# Patient Record
Sex: Male | Born: 1950 | Race: White | Hispanic: No | Marital: Married | Smoking: Never smoker
Health system: Southern US, Community
[De-identification: ages and names within clinical notes are randomized; demographics above are authoritative.]

## PROBLEM LIST (undated history)

## (undated) DIAGNOSIS — C801 Malignant (primary) neoplasm, unspecified: Secondary | ICD-10-CM

## (undated) DIAGNOSIS — M419 Scoliosis, unspecified: Secondary | ICD-10-CM

## (undated) DIAGNOSIS — N401 Enlarged prostate with lower urinary tract symptoms: Secondary | ICD-10-CM

## (undated) DIAGNOSIS — Z8042 Family history of malignant neoplasm of prostate: Secondary | ICD-10-CM

## (undated) DIAGNOSIS — N138 Other obstructive and reflux uropathy: Secondary | ICD-10-CM

## (undated) DIAGNOSIS — I10 Essential (primary) hypertension: Secondary | ICD-10-CM

## (undated) DIAGNOSIS — H9190 Unspecified hearing loss, unspecified ear: Secondary | ICD-10-CM

## (undated) DIAGNOSIS — Z801 Family history of malignant neoplasm of trachea, bronchus and lung: Secondary | ICD-10-CM

## (undated) DIAGNOSIS — Z803 Family history of malignant neoplasm of breast: Secondary | ICD-10-CM

## (undated) DIAGNOSIS — K219 Gastro-esophageal reflux disease without esophagitis: Secondary | ICD-10-CM

## (undated) DIAGNOSIS — M1990 Unspecified osteoarthritis, unspecified site: Secondary | ICD-10-CM

## (undated) DIAGNOSIS — M549 Dorsalgia, unspecified: Secondary | ICD-10-CM

## (undated) DIAGNOSIS — R519 Headache, unspecified: Secondary | ICD-10-CM

## (undated) DIAGNOSIS — N400 Enlarged prostate without lower urinary tract symptoms: Secondary | ICD-10-CM

## (undated) DIAGNOSIS — R972 Elevated prostate specific antigen [PSA]: Secondary | ICD-10-CM

## (undated) DIAGNOSIS — R51 Headache: Secondary | ICD-10-CM

## (undated) DIAGNOSIS — Z800 Family history of malignant neoplasm of digestive organs: Secondary | ICD-10-CM

## (undated) DIAGNOSIS — E7800 Pure hypercholesterolemia, unspecified: Secondary | ICD-10-CM

## (undated) DIAGNOSIS — E785 Hyperlipidemia, unspecified: Secondary | ICD-10-CM

## (undated) DIAGNOSIS — Z85038 Personal history of other malignant neoplasm of large intestine: Secondary | ICD-10-CM

## (undated) HISTORY — DX: Personal history of other malignant neoplasm of large intestine: Z85.038

## (undated) HISTORY — DX: Family history of malignant neoplasm of digestive organs: Z80.0

## (undated) HISTORY — DX: Family history of malignant neoplasm of breast: Z80.3

## (undated) HISTORY — PX: COLONOSCOPY: SHX174

## (undated) HISTORY — DX: Family history of malignant neoplasm of prostate: Z80.42

## (undated) HISTORY — PX: TONSILLECTOMY: SUR1361

## (undated) HISTORY — DX: Family history of malignant neoplasm of trachea, bronchus and lung: Z80.1

## (undated) SURGERY — Surgical Case
Anesthesia: *Unknown

---

## 2004-11-21 ENCOUNTER — Ambulatory Visit: Payer: Self-pay | Admitting: Internal Medicine

## 2006-08-13 DIAGNOSIS — D239 Other benign neoplasm of skin, unspecified: Secondary | ICD-10-CM

## 2006-08-13 HISTORY — DX: Other benign neoplasm of skin, unspecified: D23.9

## 2010-06-19 ENCOUNTER — Ambulatory Visit: Payer: Self-pay

## 2010-08-27 ENCOUNTER — Ambulatory Visit: Payer: Self-pay | Admitting: Gastroenterology

## 2010-08-27 HISTORY — PX: COLONOSCOPY: SHX174

## 2011-01-22 ENCOUNTER — Ambulatory Visit: Payer: Self-pay | Admitting: Internal Medicine

## 2011-02-04 ENCOUNTER — Ambulatory Visit: Payer: Self-pay | Admitting: Internal Medicine

## 2013-05-02 DIAGNOSIS — N401 Enlarged prostate with lower urinary tract symptoms: Secondary | ICD-10-CM | POA: Insufficient documentation

## 2013-05-02 DIAGNOSIS — R972 Elevated prostate specific antigen [PSA]: Secondary | ICD-10-CM | POA: Insufficient documentation

## 2014-03-20 NOTE — Progress Notes (Signed)
 [facility] UROLOGY [facility] [address] [place], [address]  Office Visit  Patient Name: Timothy Nicholson  Date of  Visit: 03/20/2014   Date of Birth: 07-12-1950  Assessment/Plan:  1.  BPH with lower urinary tract symptoms  Stable on tamsulosin.  2.  Elevated PSA  PSA repeated today and stable will move to annual follow-up.   History of Present Illness:  Chief Complaint: Prostate check  The patient is a 63 y.o. male who presents today for follow-up of an elevated PSA.  Prostate biopsy was performed March 2015 for PSA of 4.3.  Prostate volume was calculated at 71 cc.  Pathology report remarkable for BPH.  He has mild-to-moderate lower urinary tract symptoms and remains on tamsulosin.  He will note worsening voiding symptoms when he misses a dose.  He denies dysuria or gross hematuria.  He denies flank, abdominal, pelvic or testicular pain.  His last PSA June 2015 was 3.5.  Allergies Sulfa (sulfonamide antibiotics)  Medications   Outpatient Encounter Prescriptions as of 03/20/2014  Medication Sig Dispense Refill  . esomeprazole (NEXIUM) 20 MG capsule Take 20 mg by mouth every morning before breakfast.    . lisinopril -hydrochlorothiazide  (PRINZIDE ,ZESTORETIC ) 10-12.5 mg per tablet Take 1 tablet by mouth daily.    . tamsulosin (FLOMAX) 0.4 mg capsule Take 1 capsule (0.4 mg total) by mouth daily. 30 capsule 1  . [DISCONTINUED] lansoprazole (PREVACID) 30 MG capsule Take 30 mg by mouth daily.     No facility-administered encounter medications on file as of 03/20/2014.    Past Medical History Past Medical History  Diagnosis Date  . Back problem   . Hemorrhoids   . Hypertension     Past Surgical History No past surgical history on file.  Family History family history includes Cancer in his father and mother; Hypertension in his mother. There is no history of GU problems, Kidney cancer, or Prostate cancer.  Social History: Tobacco use:   reports that he has never smoked. He  has never used smokeless tobacco. Alcohol use:   reports that he drinks alcohol. Drug use:  reports that he does not use illicit drugs.  Review of Systems A 12 point review of systems was negative except for pertinent items noted in the HPI and on the patient intake form.  The form has been reviewed with the patient or caregiver.  Objective:  Vital Signs Filed Vitals:   03/20/14 0759  BP: 136/75  Pulse: 77  Resp: 16  Height: 177.8 cm (5' 10)  Weight: 81.647 kg (180 lb)    Physical Exam Mental Status- Alert. Cooperative, Consistent with stated age. Oriented 3. Well-nourished, Well developed. Gait- Normal. Hydration- Well hydrated. Voice- Normal.  Integumentary: General Characteristics:No rashes. Normal coloration of skin. Normal skin moisture.  Head and Neck: Global Assessment- atraumatic, normocephalic, atraumatic with no gross lesions.  No abnormal facial aesthetics. No abnormal movements. Eyeball- Bilateral- Normal. Sclera/Conjunctiva- Bilateral- normal. Nose - symmetric. Nares- symmetric. Lips: Normal color, moist, no gross lesions.  Chest and Lung Exam: Chest - normal excursion with symmetric chest walls, quiet, even and easy respiratory effort with no use of accessory muscles.    Rectal: External: Normal external exam. Internal: Normal internal exam. Prostate: Nontender, No nodules. Size: 50 cm. Seminal vesical: Nonpalpable.  Neurologic: Motor and sensory grossly intact. Affect- normal and appropriate. Speech- Normal. Thought content- Normal. Cognitive function- Normal. Coordination- Normal.   Musculoskeletal: Posture- Normal.  Musculoskeletal strength and tone grossly normal.   Test Results Results for orders placed or  performed in visit on 03/20/14  [place] POC URINALYSIS WITH MICROSCOPIC  Result Value Ref Range   Color, UA Yellow    Clarity, UA Clear    Glucose, UA Negative    Bilirubin, UA Negative    Ketones, POC Negative     Spec Grav, UA 1.020    Blood, UA Negative    pH, UA 5.0    Protein, UA Negative    Urobilinogen, UA Normal    Leukocytes, UA Negative    Nitrite, UA Negative    WBC UA, POC     RBC UA, POC     Bacteria UA, POC     Sediment UA, POC      Urine Microscopy: WBC's: Negative. RBC's: Negative. Bacteria: Negative. Sediment: Negative.  Imaging: None    Scott C Stoioff

## 2014-03-21 NOTE — Telephone Encounter (Signed)
-----   Message from Glendia JAYSON Barba, MD sent at 03/21/2014  8:57 AM EST ----- PSA has increased to 17.4-large jumps in PSA are usually due to inflammation.  Recommend repeating the PSA in 6 weeks.  If it is still elevated will need to discuss repeat biopsy.

## 2014-03-21 NOTE — Progress Notes (Signed)
 Chief Complaint  Patient presents with  . Flu Vaccine    pt requesting flu shot  . Headache    ongoing for years    HPI  Timothy Nicholson is a 63 y.o. here for an acute issue.  Hx of neck pain, flares at various times.  Cleans gutters.  Only a mild flare today.  Plays golf.  Otherwise doing well. Has occasional lightheadedness with standing.    ROS  Pertinent items are noted in HPI.  Outpatient Encounter Prescriptions as of 03/21/2014  Medication Sig Dispense Refill  . esomeprazole (NEXIUM) 20 MG DR capsule Take 20 mg by mouth once daily.    . lisinopril -hydrochlorothiazide  (PRINZIDE ,ZESTORETIC ) 10-12.5 mg tablet Take 1 tablet by mouth once daily.    . tamsulosin (FLOMAX) 0.4 mg capsule Take 0.4 mg by mouth once daily. Take 30 minutes after same meal each day.    . [DISCONTINUED] lansoprazole (PREVACID) 30 MG DR capsule Take 30 mg by mouth once daily.     No facility-administered encounter medications on file as of 03/21/2014.    Allergies as of 03/21/2014 [place] Anes as Reviewed 03/21/2014  Allergen Reaction Noted  . Sulfa (sulfonamide antibiotics) Unknown 06/23/2013    Past Medical History  Diagnosis Date  . GERD (gastroesophageal reflux disease)   . Hyperlipidemia   . Hypertension   . Dyslipidemia   . Osteoarthritis   . Chronic back pain   . Decreased hearing     Past Surgical History  Procedure Laterality Date  . Tonsillectomy      Filed Vitals:   03/21/14 0942  BP: 124/72  Pulse: 83  Temp: 36.9 C (98.4 F)    Physical Exam  General. Well appearing; NAD; VS reviewed     Neck. Supple. With mild paracervical tenderness. No spinal tenderness. Lungs. Respirations unlabored; clear to auscultation bilaterally. Cardiovascular. Heart regular rate and rhythm without murmurs, gallops, or rubs. Extremities:  No edema. Skin. Normal color and turgor Neurologic. Alert and oriented x3   Assessment and Plan 1. Cervicalgia Intermittent. - cyclobenzaprine (FLEXERIL) 10 MG  tablet; Take 1 tablet (10 mg total) by mouth 3 (three) times daily as needed.  Dispense: 21 tablet; Refill: 0 - naproxen (NAPROSYN) 500 MG tablet; Take 1 tablet (500 mg total) by mouth 2 (two) times daily with meals.  Dispense: 60 tablet; Refill: 0  2. Health maintenance.  Flu vaccine.   I have personally performed this service.  [address] [place], [place]

## 2014-05-17 DIAGNOSIS — E7800 Pure hypercholesterolemia, unspecified: Secondary | ICD-10-CM | POA: Insufficient documentation

## 2014-05-17 DIAGNOSIS — I129 Hypertensive chronic kidney disease with stage 1 through stage 4 chronic kidney disease, or unspecified chronic kidney disease: Secondary | ICD-10-CM | POA: Insufficient documentation

## 2014-09-24 ENCOUNTER — Encounter (HOSPITAL_BASED_OUTPATIENT_CLINIC_OR_DEPARTMENT_OTHER): Payer: Self-pay | Admitting: *Deleted

## 2014-09-24 ENCOUNTER — Emergency Department (HOSPITAL_BASED_OUTPATIENT_CLINIC_OR_DEPARTMENT_OTHER)
Admission: EM | Admit: 2014-09-24 | Discharge: 2014-09-24 | Disposition: A | Discharge status: Home / Self Care | Payer: 59 | Attending: Emergency Medicine | Admitting: Emergency Medicine

## 2014-09-24 DIAGNOSIS — N400 Enlarged prostate without lower urinary tract symptoms: Secondary | ICD-10-CM | POA: Diagnosis not present

## 2014-09-24 DIAGNOSIS — N390 Urinary tract infection, site not specified: Secondary | ICD-10-CM | POA: Insufficient documentation

## 2014-09-24 DIAGNOSIS — R339 Retention of urine, unspecified: Secondary | ICD-10-CM | POA: Diagnosis present

## 2014-09-24 HISTORY — DX: Benign prostatic hyperplasia without lower urinary tract symptoms: N40.0

## 2014-09-24 HISTORY — DX: Essential (primary) hypertension: I10

## 2014-09-24 LAB — URINALYSIS, ROUTINE W REFLEX MICROSCOPIC
Bilirubin Urine: NEGATIVE
GLUCOSE, UA: NEGATIVE mg/dL
HGB URINE DIPSTICK: NEGATIVE
Ketones, ur: NEGATIVE mg/dL
NITRITE: POSITIVE — AB
PH: 6 (ref 5.0–8.0)
Protein, ur: NEGATIVE mg/dL
SPECIFIC GRAVITY, URINE: 1.017 (ref 1.005–1.030)
Urobilinogen, UA: 0.2 mg/dL (ref 0.0–1.0)

## 2014-09-24 LAB — URINE MICROSCOPIC-ADD ON

## 2014-09-24 MED ORDER — CIPROFLOXACIN HCL 500 MG PO TABS: 500.0000 mg | ORAL_TABLET | Freq: Two times a day (BID) | ORAL | Status: DC

## 2014-09-24 MED ORDER — CIPROFLOXACIN HCL 500 MG PO TABS
500.0000 mg | ORAL_TABLET | Freq: Once | ORAL | Status: AC
  Administered 2014-09-24: 500 mg via ORAL
  Filled 2014-09-24: qty 1

## 2014-09-24 NOTE — Discharge Instructions (Signed)
Acute Urinary Retention °Acute urinary retention is the temporary inability to urinate. °This is a common problem in older men. As men age their prostates become larger and block the flow of urine from the bladder. This is usually a problem that has come on gradually.  °HOME CARE INSTRUCTIONS °If you are sent home with a Foley catheter and a drainage system, you will need to discuss the best course of action with your health care provider. While the catheter is in, maintain a good intake of fluids. Keep the drainage bag emptied and lower than your catheter. This is so that contaminated urine will not flow back into your bladder, which could lead to a urinary tract infection. °There are two main types of drainage bags. One is a large bag that usually is used at night. It has a good capacity that will allow you to sleep through the night without having to empty it. The second type is called a leg bag. It has a smaller capacity, so it needs to be emptied more frequently. However, the main advantage is that it can be attached by a leg strap and can go underneath your clothing, allowing you the freedom to move about or leave your home. °Only take over-the-counter or prescription medicines for pain, discomfort, or fever as directed by your health care provider.  °SEEK MEDICAL CARE IF: °· You develop a low-grade fever. °· You experience spasms or leakage of urine with the spasms. °SEEK IMMEDIATE MEDICAL CARE IF:  °· You develop chills or fever. °· Your catheter stops draining urine. °· Your catheter falls out. °· You start to develop increased bleeding that does not respond to rest and increased fluid intake. °MAKE SURE YOU: °· Understand these instructions. °· Will watch your condition. °· Will get help right away if you are not doing well or get worse. °Document Released: 07/07/2000 Document Revised: 04/05/2013 Document Reviewed: 09/09/2012 °ExitCare® Patient Information ©2015 [employer]. This information is not  intended to replace advice given to you by your health care provider. Make sure you discuss any questions you have with your health care provider. °Urinary Tract Infection °Urinary tract infections (UTIs) can develop anywhere along your urinary tract. Your urinary tract is your body's drainage system for removing wastes and extra water. Your urinary tract includes two kidneys, two ureters, a bladder, and a urethra. Your kidneys are a pair of bean-shaped organs. Each kidney is about the size of your fist. They are located below your ribs, one on each side of your spine. °CAUSES °Infections are caused by microbes, which are microscopic organisms, including fungi, viruses, and bacteria. These organisms are so small that they can only be seen through a microscope. Bacteria are the microbes that most commonly cause UTIs. °SYMPTOMS  °Symptoms of UTIs [place] vary by age and gender of the patient and by the location of the infection. Symptoms in young women typically include a frequent and intense urge to urinate and a painful, burning feeling in the bladder or urethra during urination. Older women and men are more likely to be tired, shaky, and weak and have muscle aches and abdominal pain. A fever [place] mean the infection is in your kidneys. Other symptoms of a kidney infection include pain in your back or sides below the ribs, nausea, and vomiting. °DIAGNOSIS °To diagnose a UTI, your caregiver will ask you about your symptoms. Your caregiver also will ask to provide a urine sample. The urine sample will be tested for bacteria and white blood   cells. White blood cells are made by your body to help fight infection. °TREATMENT  °Typically, UTIs can be treated with medication. Because most UTIs are caused by a bacterial infection, they usually can be treated with the use of antibiotics. The choice of antibiotic and length of treatment depend on your symptoms and the type of bacteria causing your infection. °HOME CARE  INSTRUCTIONS °· If you were prescribed antibiotics, take them exactly as your caregiver instructs you. Finish the medication even if you feel better after you have only taken some of the medication. °· Drink enough water and fluids to keep your urine clear or pale yellow. °· Avoid caffeine, tea, and carbonated beverages. They tend to irritate your bladder. °· Empty your bladder often. Avoid holding urine for long periods of time. °· Empty your bladder before and after sexual intercourse. °· After a bowel movement, women should cleanse from front to back. Use each tissue only once. °SEEK MEDICAL CARE IF:  °· You have back pain. °· You develop a fever. °· Your symptoms do not begin to resolve within 3 days. °SEEK IMMEDIATE MEDICAL CARE IF:  °· You have severe back pain or lower abdominal pain. °· You develop chills. °· You have nausea or vomiting. °· You have continued burning or discomfort with urination. °MAKE SURE YOU:  °· Understand these instructions. °· Will watch your condition. °· Will get help right away if you are not doing well or get worse. °Document Released: 01/08/2005 Document Revised: 09/30/2011 Document Reviewed: 05/09/2011 °ExitCare® Patient Information ©2015 [employer]. This information is not intended to replace advice given to you by your health care provider. Make sure you discuss any questions you have with your health care provider. ° °

## 2014-09-24 NOTE — ED Provider Notes (Signed)
TIME SEEN: 9:50 AM  CHIEF COMPLAINT: Urinary retention, dysuria  HPI: Pt is a 64 y.o. male with history of BPH who is on Flomax who presents to the emergency department with urinary retention for the past 2 days and dysuria when he is able to urinate. No back pain, numbness, tingly or focal weakness. No bowel or bladder incontinence. Did have a fever of 101.5 last night. Denies nausea, vomiting or diarrhea. No new medications. Had urinary retention once before because of his BPH.  ROS: See HPI Constitutional: no fever  Eyes: no drainage  ENT: no runny nose   Cardiovascular:  no chest pain  Resp: no SOB  GI: no vomiting GU: no dysuria Integumentary: no rash  Allergy: no hives  Musculoskeletal: no leg swelling  Neurological: no slurred speech ROS otherwise negative  PAST MEDICAL HISTORY/PAST SURGICAL HISTORY:  No past medical history on file.  MEDICATIONS:  Prior to Admission medications   Not on File    ALLERGIES:  Allergies not on file  SOCIAL HISTORY:  History  Substance Use Topics  . Smoking status: Not on file  . Smokeless tobacco: Not on file  . Alcohol Use: Not on file    FAMILY HISTORY: No family history on file.  EXAM: BP 135/72 mmHg  Pulse 93  Temp(Src) 98.1 F (36.7 C) (Oral)  Resp 18  Ht 5\' 10"  (1.778 m)  Wt 185 lb (83.915 kg)  BMI 26.54 kg/m2  SpO2 95% CONSTITUTIONAL: Alert and oriented and responds appropriately to questions. Well-appearing; well-nourished HEAD: Normocephalic EYES: Conjunctivae clear, PERRL ENT: normal nose; no rhinorrhea; moist mucous membranes; pharynx without lesions noted NECK: Supple, no meningismus, no LAD  CARD: RRR; S1 and S2 appreciated; no murmurs, no clicks, no rubs, no gallops RESP: Normal chest excursion without splinting or tachypnea; breath sounds clear and equal bilaterally; no wheezes, no rhonchi, no rales, no hypoxia or respiratory distress, speaking full sentences ABD/GI: Normal bowel sounds; soft, mildly  tender over the lower abdomen with mild distention from air retention, no rebound, no guarding, no peritoneal signs BACK:  The back appears normal and is non-tender to palpation, there is no CVA tenderness EXT: Normal ROM in all joints; non-tender to palpation; no edema; normal capillary refill; no cyanosis, no calf tenderness or swelling    SKIN: Normal color for age and race; warm NEURO: Moves all extremities equally, sensation to light touch intact diffusely, cranial nerves II through XII intact PSYCH: The patient's mood and manner are appropriate. Grooming and personal hygiene are appropriate.  MEDICAL DECISION MAKING: Patient here with urinary retention likely from BPH. Foley catheter placed and over 400 mL of urine drained. Reports feeling much better. He does have a nitrite-positive UTI which [place]er also be contributing to his urinary retention. Culture pending. Will discharge on Cipro. He has an appointment with his urologist Dr. Alinda Money in 3 days. Discussed return precautions and supportive care instructions. He verbalizes understanding and is comfortable with plan.       [place], DO 09/24/14 1644

## 2014-09-24 NOTE — ED Notes (Signed)
Bladder scan 435cc urine in blader

## 2014-09-24 NOTE — ED Notes (Signed)
Patient c/o urinary retention for the past two days, takes flomax, feels urge to urinate, but only dribbles. Fever 101.5 last night

## 2014-09-27 LAB — URINE CULTURE

## 2014-09-28 ENCOUNTER — Telehealth (HOSPITAL_BASED_OUTPATIENT_CLINIC_OR_DEPARTMENT_OTHER): Payer: Self-pay | Admitting: Emergency Medicine

## 2014-09-28 NOTE — Telephone Encounter (Signed)
Post ED Visit - Positive Culture Follow-up  Culture report reviewed by antimicrobial stewardship pharmacist: []  Wes Dulaney, Pharm.D., BCPS [x]  Heide Guile, Pharm.D., BCPS []  Alycia Rossetti, Pharm.D., BCPS []  [place]ridge, Pharm.D., BCPS, AAHIVP []  Legrand Como, Pharm.D., BCPS, AAHIVP []  Isac Sarna, Pharm.D., BCPS  Positive urine culture Staph Treated with ciprofloxacin, organism sensitive to the same and no further patient follow-up is required at this time.  Hazle Nordmann 09/28/2014, 1:47 PM

## 2014-10-23 NOTE — Progress Notes (Signed)
 REVIEW OF SYSTEMS:  Constitutional:  Negative HENT:  Negative Eyes:  Negative Respiratory:  Negative Cardiovascular:  Negative Gastrointestinal:  Negative Endocrine:  Negative Genitourinary:  Negative Musculoskeletal:  Negative Skin:  Negative Allergic/Immunologic:  Negative Neurological:  Negative Hematological:  Negative Pyschiatric/Behavioral:  Negative

## 2014-11-13 ENCOUNTER — Other Ambulatory Visit: Payer: Self-pay | Admitting: Internal Medicine

## 2014-11-13 DIAGNOSIS — M545 Low back pain, unspecified: Secondary | ICD-10-CM

## 2014-11-16 ENCOUNTER — Ambulatory Visit
Admission: RE | Admit: 2014-11-16 | Discharge: 2014-11-16 | Disposition: A | Discharge status: Home / Self Care | Payer: 59 | Source: Ambulatory Visit | Attending: Internal Medicine | Admitting: Internal Medicine

## 2014-11-16 DIAGNOSIS — I77811 Abdominal aortic ectasia: Secondary | ICD-10-CM | POA: Diagnosis not present

## 2014-11-16 DIAGNOSIS — Z8249 Family history of ischemic heart disease and other diseases of the circulatory system: Secondary | ICD-10-CM | POA: Diagnosis present

## 2014-11-16 DIAGNOSIS — M545 Low back pain, unspecified: Secondary | ICD-10-CM

## 2016-09-17 ENCOUNTER — Encounter (INDEPENDENT_AMBULATORY_CARE_PROVIDER_SITE_OTHER): Payer: Self-pay | Admitting: Ophthalmology

## 2016-11-09 IMAGING — US US RETROPERITONEAL COMPLETE
1 series · 14 of 25 positions shown · non-contrast
Comparison: MRI 02/04/2011.

CLINICAL DATA: Back pain.

EXAM:
ULTRASOUND RETROPERITONEAL COMPLETE
TECHNIQUE: Ultrasound examination of the abdominal aorta was performed to
evaluate for abdominal aortic aneurysm. The common iliac arteries,
IVC, and kidneys were also evaluated.

[Series 1: us retroperitoneal complete · 0.35mm/px · 14 of 46 slices shown]
[im 1/46]
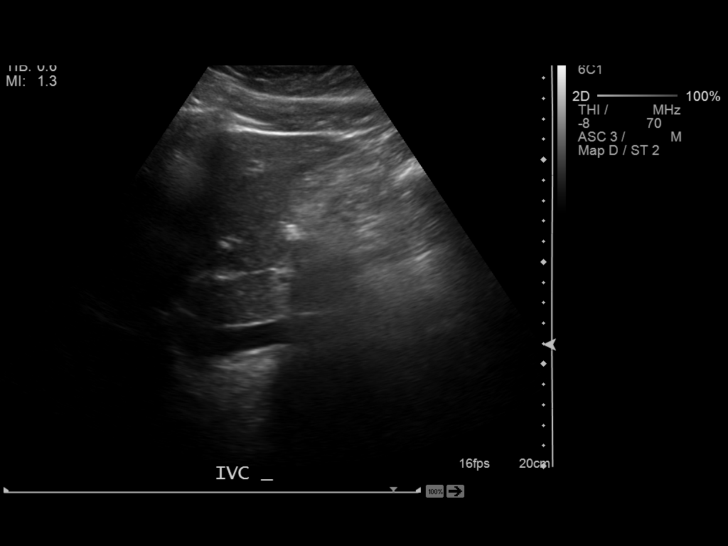
[im 4/46]
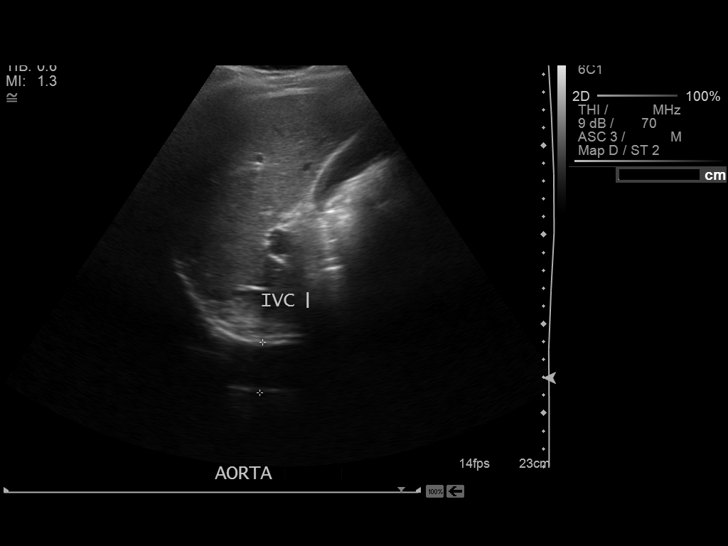
[im 8/46]
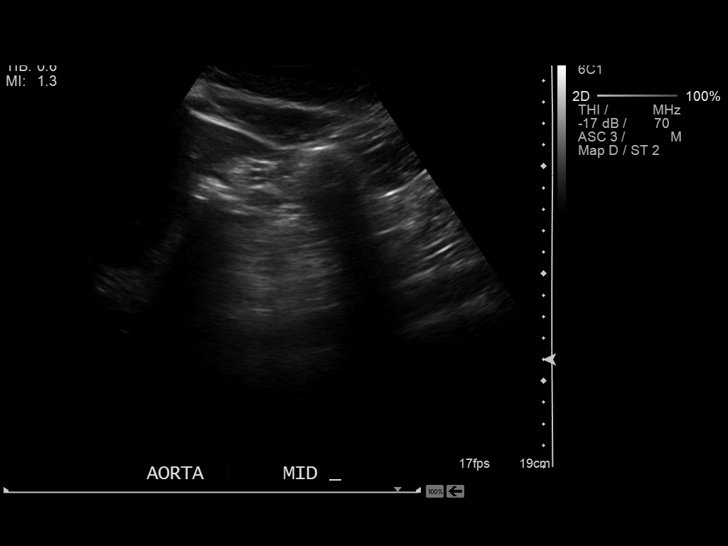
[im 12/46]
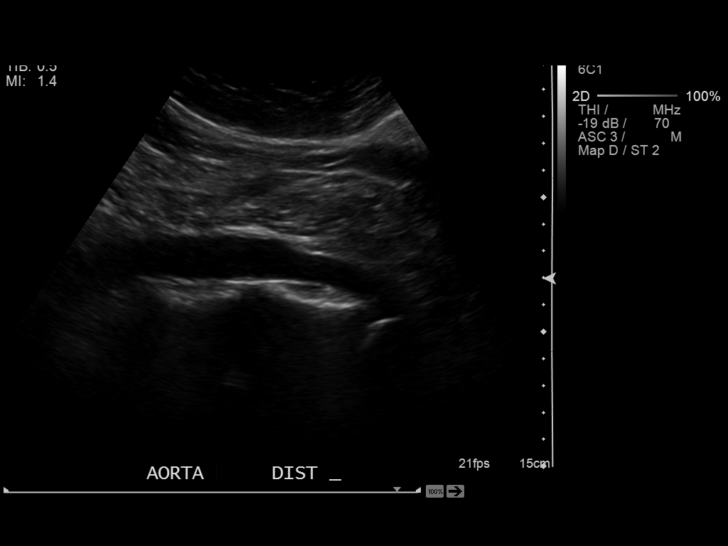
[im 16/46]
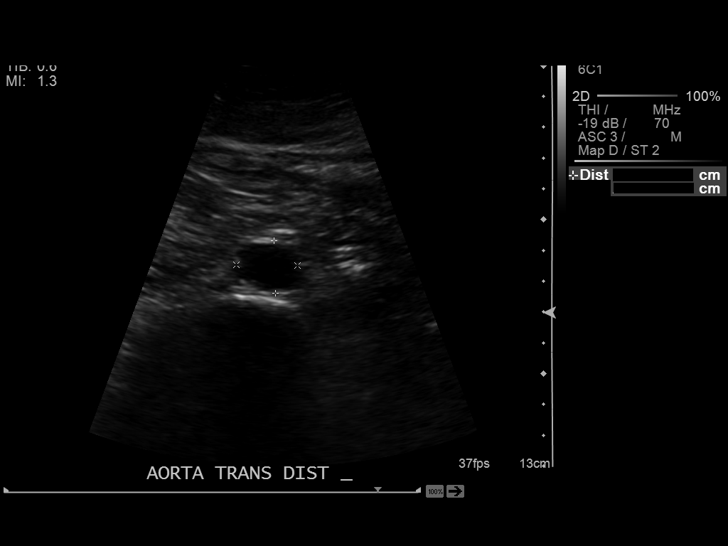
[im 17/46]
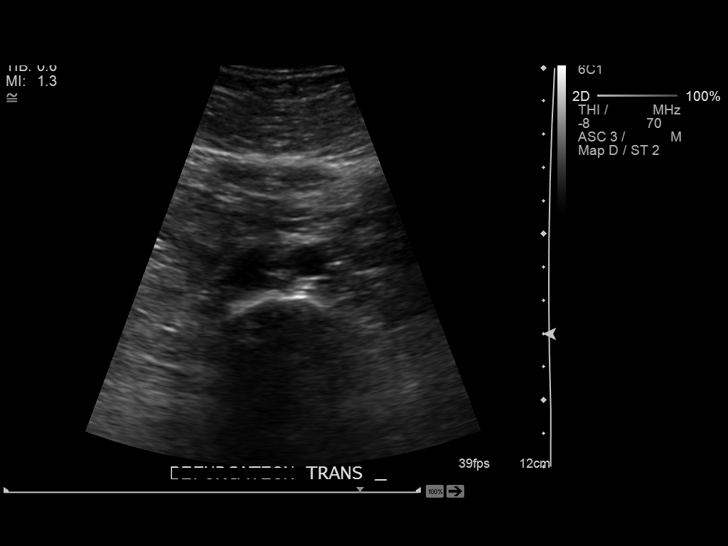
[im 21/46]
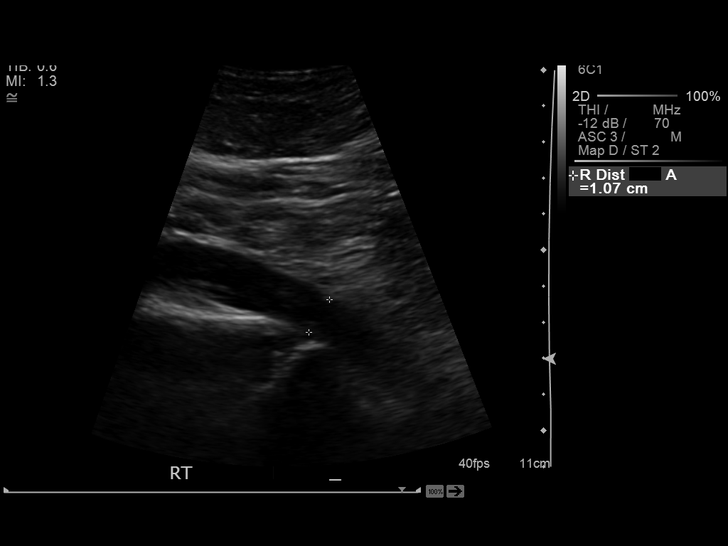
[im 25/46]
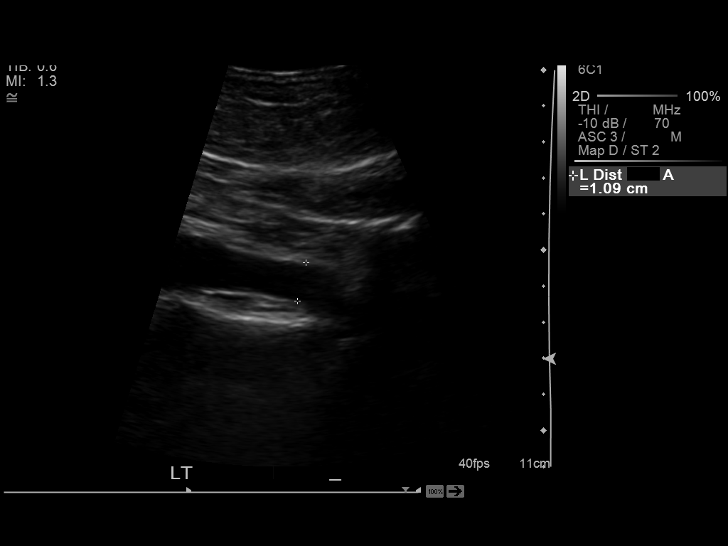
[im 29/46]
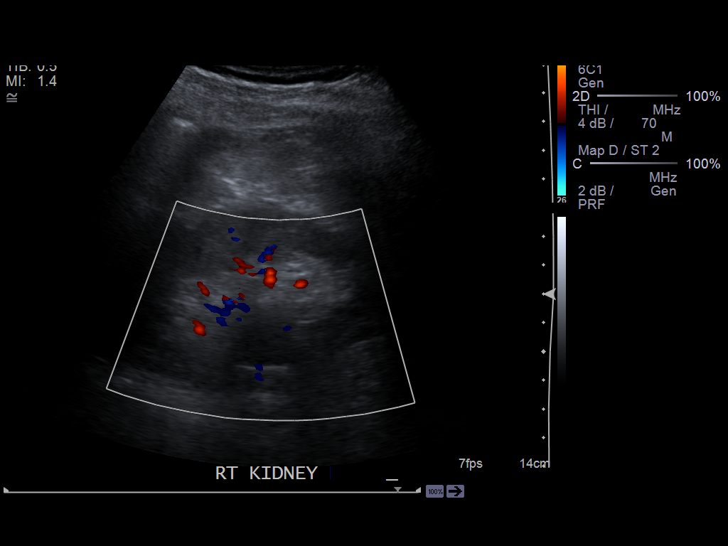
[im 31/46]
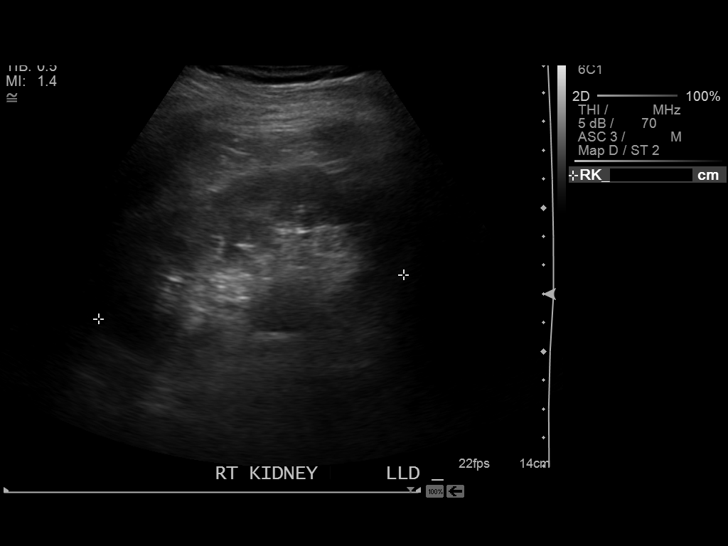
[im 34/46]
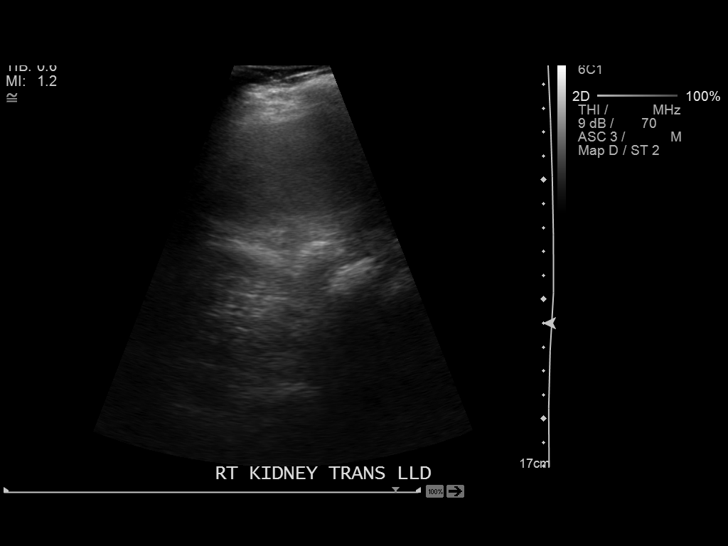
[im 38/46]
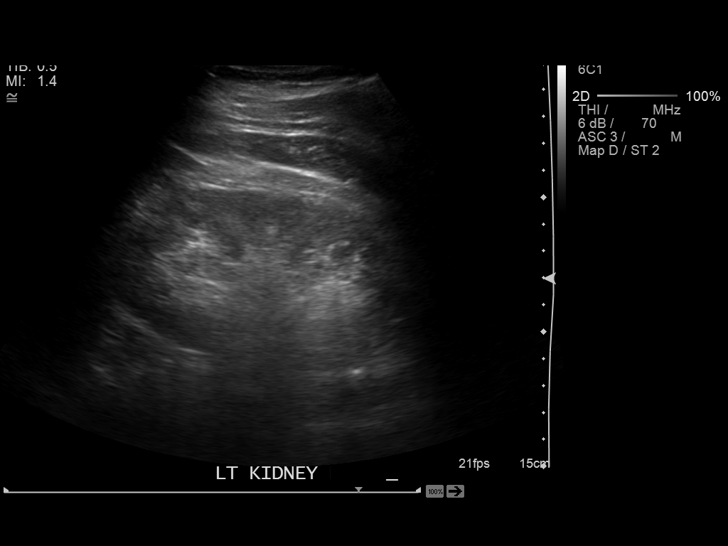
[im 42/46]
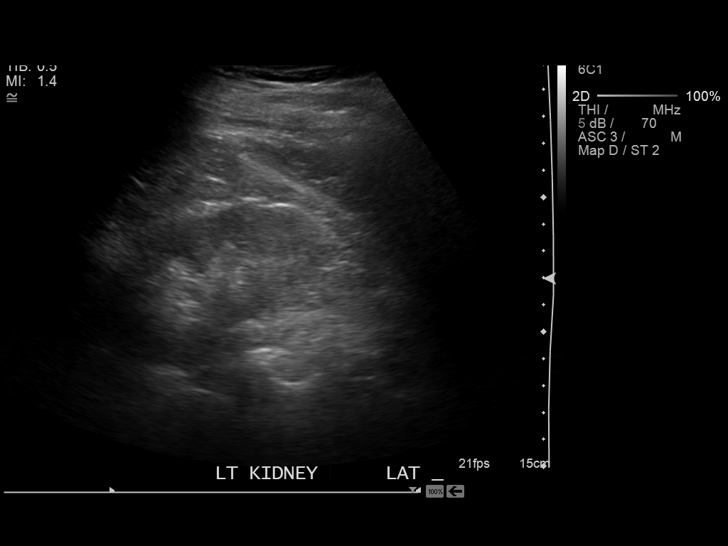
[im 46/46]
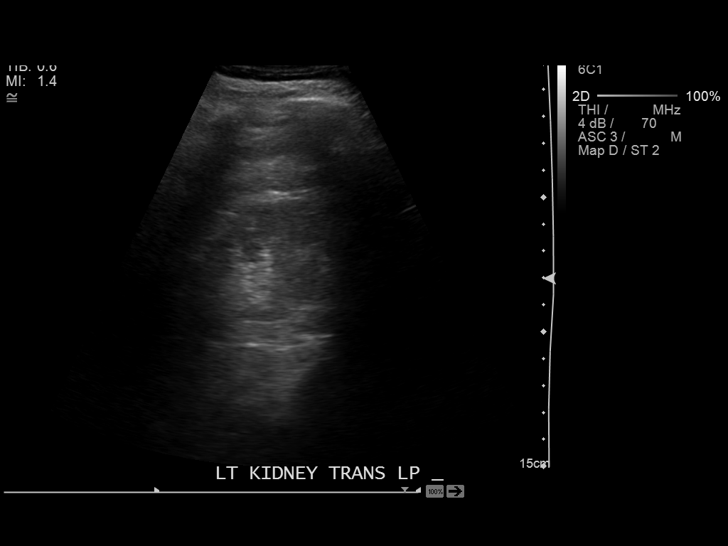

[14 of 25 positions shown; findings below may reference images not displayed]

FINDINGS: Abdominal Aorta

No aneurysm identified. Midportion of the abdominal aorta not
completely visualized due to overlying bowel gas.

Maximum AP

Diameter:  2.8 cm

Maximum TRV

Diameter: 2.5 cm

Right Common Iliac Artery

1.4 cm

Left Common Iliac Artery

1.2 cm

IVC

No abnormality visualized.

Right Kidney

Length: 10.7 cm Echogenicity within normal limits. No mass or
hydronephrosis visualized.

Left Kidney

Length: 11.2 cm Echogenicity within normal limits. No mass or
hydronephrosis visualized.
IMPRESSION: Aortoiliac ectasia. Maximum diameter the abdominal aorta is 2.8 cm.
Ectatic abdominal aorta at risk for aneurysm development. Recommend
followup by ultrasound in 5 years. This recommendation follows ACR
consensus guidelines: White Paper of the ACR Incidental Findings
Committee II on Vascular Findings. [HOSPITAL] 6272;

## 2017-11-19 ENCOUNTER — Ambulatory Visit
Admission: RE | Admit: 2017-11-19 | Discharge: 2017-11-19 | Disposition: A | Discharge status: Home / Self Care | Payer: Medicare Other | Source: Ambulatory Visit | Attending: Gastroenterology | Admitting: Gastroenterology

## 2017-11-19 ENCOUNTER — Ambulatory Visit: Payer: Medicare Other | Admitting: Anesthesiology

## 2017-11-19 ENCOUNTER — Encounter
Admission: RE | Disposition: A | Discharge status: Home / Self Care | Payer: Self-pay | Source: Ambulatory Visit | Attending: Gastroenterology

## 2017-11-19 ENCOUNTER — Encounter: Payer: Self-pay | Admitting: Gastroenterology

## 2017-11-19 DIAGNOSIS — K5730 Diverticulosis of large intestine without perforation or abscess without bleeding: Secondary | ICD-10-CM | POA: Diagnosis not present

## 2017-11-19 DIAGNOSIS — R12 Heartburn: Secondary | ICD-10-CM | POA: Diagnosis not present

## 2017-11-19 DIAGNOSIS — N400 Enlarged prostate without lower urinary tract symptoms: Secondary | ICD-10-CM | POA: Insufficient documentation

## 2017-11-19 DIAGNOSIS — K210 Gastro-esophageal reflux disease with esophagitis: Secondary | ICD-10-CM | POA: Insufficient documentation

## 2017-11-19 DIAGNOSIS — C187 Malignant neoplasm of sigmoid colon: Secondary | ICD-10-CM | POA: Insufficient documentation

## 2017-11-19 DIAGNOSIS — Z86010 Personal history of colonic polyps: Secondary | ICD-10-CM | POA: Diagnosis present

## 2017-11-19 DIAGNOSIS — K317 Polyp of stomach and duodenum: Secondary | ICD-10-CM | POA: Insufficient documentation

## 2017-11-19 DIAGNOSIS — Z882 Allergy status to sulfonamides status: Secondary | ICD-10-CM | POA: Diagnosis not present

## 2017-11-19 DIAGNOSIS — I10 Essential (primary) hypertension: Secondary | ICD-10-CM | POA: Insufficient documentation

## 2017-11-19 DIAGNOSIS — Z1211 Encounter for screening for malignant neoplasm of colon: Secondary | ICD-10-CM | POA: Diagnosis not present

## 2017-11-19 DIAGNOSIS — K219 Gastro-esophageal reflux disease without esophagitis: Secondary | ICD-10-CM | POA: Diagnosis present

## 2017-11-19 DIAGNOSIS — E7800 Pure hypercholesterolemia, unspecified: Secondary | ICD-10-CM | POA: Insufficient documentation

## 2017-11-19 DIAGNOSIS — K224 Dyskinesia of esophagus: Secondary | ICD-10-CM | POA: Insufficient documentation

## 2017-11-19 DIAGNOSIS — E755 Other lipid storage disorders: Secondary | ICD-10-CM | POA: Insufficient documentation

## 2017-11-19 HISTORY — PX: ESOPHAGOGASTRODUODENOSCOPY (EGD) WITH PROPOFOL: SHX5813

## 2017-11-19 HISTORY — DX: Pure hypercholesterolemia, unspecified: E78.00

## 2017-11-19 HISTORY — PX: COLONOSCOPY WITH PROPOFOL: SHX5780

## 2017-11-19 HISTORY — DX: Headache, unspecified: R51.9

## 2017-11-19 HISTORY — DX: Headache: R51

## 2017-11-19 HISTORY — DX: Gastro-esophageal reflux disease without esophagitis: K21.9

## 2017-11-19 SURGERY — ESOPHAGOGASTRODUODENOSCOPY (EGD) WITH PROPOFOL
Anesthesia: General

## 2017-11-19 MED ORDER — EPHEDRINE SULFATE 50 MG/ML IJ SOLN
INTRAMUSCULAR | Status: DC | PRN
  Administered 2017-11-19 (×5): 5 mg via INTRAVENOUS

## 2017-11-19 MED ORDER — PROPOFOL 500 MG/50ML IV EMUL
INTRAVENOUS | Status: AC
  Filled 2017-11-19: qty 50

## 2017-11-19 MED ORDER — PROPOFOL 500 MG/50ML IV EMUL
INTRAVENOUS | Status: DC | PRN
  Administered 2017-11-19: 140 ug/kg/min via INTRAVENOUS

## 2017-11-19 MED ORDER — SODIUM CHLORIDE 0.9 % IV SOLN
INTRAVENOUS | Status: DC
  Administered 2017-11-19: 11:00:00 via INTRAVENOUS
  Administered 2017-11-19: 1000 mL via INTRAVENOUS

## 2017-11-19 MED ORDER — PROPOFOL 10 MG/ML IV BOLUS
INTRAVENOUS | Status: DC | PRN
  Administered 2017-11-19: 30 mg via INTRAVENOUS
  Administered 2017-11-19 (×2): 20 mg via INTRAVENOUS
  Administered 2017-11-19: 60 mg via INTRAVENOUS

## 2017-11-19 MED ORDER — LIDOCAINE HCL (CARDIAC) PF 100 MG/5ML IV SOSY
PREFILLED_SYRINGE | INTRAVENOUS | Status: DC | PRN
  Administered 2017-11-19: 100 mg via INTRAVENOUS

## 2017-11-19 MED ORDER — LIDOCAINE HCL (PF) 2 % IJ SOLN
INTRAMUSCULAR | Status: AC
  Filled 2017-11-19: qty 10

## 2017-11-19 MED ORDER — EPHEDRINE SULFATE 50 MG/ML IJ SOLN
INTRAMUSCULAR | Status: AC
  Filled 2017-11-19: qty 1

## 2017-11-19 NOTE — Anesthesia Post-op Follow-up Note (Signed)
Anesthesia QCDR form completed.        

## 2017-11-19 NOTE — Op Note (Signed)
[facility] Gastroenterology Patient Name: Timothy Nicholson Procedure Date: 11/19/2017 9:00 AM MRN: [id number] Account #: [id number] Date of Birth: [birth year] Admit Type: Outpatient Age: 67 Room: [facility] ENDO ROOM 1 Gender: Male Note Status: Finalized Procedure:            Upper GI endoscopy Indications:          Heartburn, Gastro-esophageal reflux disease Providers:            Lollie Sails, MD Referring MD:         Ocie Cornfield. Ouida Sills MD, MD (Referring MD) Medicines:            Monitored Anesthesia Care Complications:        No immediate complications. Procedure:            Pre-Anesthesia Assessment:                       - ASA Grade Assessment: II - A patient with mild                        systemic disease.                       After obtaining informed consent, the endoscope was                        passed under direct vision. Throughout the procedure,                        the patient's blood pressure, pulse, and oxygen                        saturations were monitored continuously. The Endoscope                        was introduced through the mouth, and advanced to the                        third part of duodenum. The upper GI endoscopy was                        accomplished without difficulty. The patient tolerated                        the procedure well. Findings:      LA Grade A (one or more mucosal breaks less than 5 mm, not extending       between tops of 2 mucosal folds) esophagitis with no bleeding was found.       Biopsies were taken with a cold forceps for histology.      Abnormal motility was noted in the middle third of the esophagus and in       the lower third of the esophagus. The cricopharyngeus was normal. There       is an intermittant corrugated appearance of the mid and distal       esophagus. Biopsies were taken with a cold forceps for histology at       about 26 cm from the incisors.      A few 1 to 7 mm semi-sessile polyps with  no bleeding and no stigmata of       recent bleeding were found on the  anterior wall of the gastric body.       Biopsies were taken with a cold forceps for histology.      A single 3 mm sessile polyp with no bleeding and no stigmata of recent       bleeding was found in the cardia.      The examined duodenum was normal.      The cardia and gastric fundus were normal on retroflexion otherwise. Impression:           - LA Grade A reflux esophagitis. Biopsied.                       - Abnormal esophageal motility. Biopsied.                       - A few gastric polyps. Biopsied.                       - A single gastric polyp.                       - Normal examined duodenum. Recommendation:       - Use Protonix (pantoprazole) 40 mg PO daily daily.                       - Return to GI clinic in 1 month.                       - Await pathology results. Procedure Code(s):    --- Professional ---                       [phone], Esophagogastroduodenoscopy, flexible, transoral;                        with biopsy, single or multiple Diagnosis Code(s):    --- Professional ---                       K21.0, Gastro-esophageal reflux disease with esophagitis                       K22.4, Dyskinesia of esophagus                       K31.7, Polyp of stomach and duodenum                       R12, Heartburn CPT copyright 2017 American Medical Association. All rights reserved. The codes documented in this report are preliminary and upon coder review [employer]uegra  be revised to meet current compliance requirements. Lollie Sails, MD 11/19/2017 9:53:02 AM This report has been signed electronically. Number of Addenda: 0 Note Initiated On: 11/19/2017 9:00 AM      [facility]

## 2017-11-19 NOTE — Anesthesia Postprocedure Evaluation (Signed)
Anesthesia Post Note  Patient: Trevon Strothers Christopher  Procedure(s) Performed: ESOPHAGOGASTRODUODENOSCOPY (EGD) WITH PROPOFOL (N/A ) COLONOSCOPY WITH PROPOFOL (N/A )  Patient location during evaluation: Endoscopy Anesthesia Type: General Level of consciousness: awake and alert Pain management: pain level controlled Vital Signs Assessment: post-procedure vital signs reviewed and stable Respiratory status: spontaneous breathing, nonlabored ventilation and respiratory function stable Cardiovascular status: blood pressure returned to baseline and stable Postop Assessment: no apparent nausea or vomiting Anesthetic complications: no     Last Vitals:  Vitals:   11/19/17 1108 11/19/17 1118  BP: 114/76 122/76  Pulse: 89 90  Resp: 14 18  Temp:    SpO2: 99% 98%    Last Pain:  Vitals:   11/19/17 1058  TempSrc:   PainSc: 0-No pain                 Alphonsus Sias

## 2017-11-19 NOTE — Anesthesia Preprocedure Evaluation (Signed)
Anesthesia Evaluation  Patient identified by MRN, date of birth, ID band Patient awake    Reviewed: Allergy & Precautions, H&P , NPO status , reviewed documented beta blocker date and time   Airway Mallampati: III  TM Distance: >3 FB Neck ROM: full    Dental  (+) Chipped   Pulmonary    Pulmonary exam normal        Cardiovascular hypertension, Normal cardiovascular exam     Neuro/Psych  Headaches,    GI/Hepatic GERD  Controlled,  Endo/Other    Renal/GU      Musculoskeletal   Abdominal   Peds  Hematology   Anesthesia Other Findings Past Medical History: No date: Enlarged prostate No date: GERD (gastroesophageal reflux disease) No date: Headache     Comment:  migraine without aura No date: Hypercholesterolemia No date: Hypertension  Past Surgical History: 08/27/2010: COLONOSCOPY No date: TONSILLECTOMY  BMI    Body Mass Index:  25.83 kg/m      Reproductive/Obstetrics                             Anesthesia Physical Anesthesia Plan  ASA: II  Anesthesia Plan: General   Post-op Pain Management:    Induction: Intravenous  PONV Risk Score and Plan: 2 and Treatment Swinson vary due to age or medical condition and TIVA  Airway Management Planned: Nasal Cannula and Natural Airway  Additional Equipment:   Intra-op Plan:   Post-operative Plan:   Informed Consent: I have reviewed the patients History and Physical, chart, labs and discussed the procedure including the risks, benefits and alternatives for the proposed anesthesia with the patient or authorized representative who has indicated his/her understanding and acceptance.   Dental Advisory Given  Plan Discussed with:   Anesthesia Plan Comments:         Anesthesia Quick Evaluation

## 2017-11-19 NOTE — Op Note (Signed)
[facility] Patient Name: Timothy Nicholson Procedure Date: 11/19/2017 8:58 AM MRN: [id number] Account #: [id number] Date of Birth: March 27, 1951 Admit Type: Outpatient Age: 67 Room: [facility] ENDO ROOM 1 Gender: Male Note Status: Finalized Procedure:            Colonoscopy Indications:          Screening for colorectal malignant neoplasm Providers:            Lollie Sails, MD Medicines:            Monitored Anesthesia Care Complications:        No immediate complications. Procedure:            Pre-Anesthesia Assessment:                       - ASA Grade Assessment: II - A patient with mild                        systemic disease.                       After obtaining informed consent, the colonoscope was                        passed under direct vision. Throughout the procedure,                        the patient's blood pressure, pulse, and oxygen                        saturations were monitored continuously. The                        Colonoscope was introduced through the anus and                        advanced to the the cecum, identified by appendiceal                        orifice and ileocecal valve. The colonoscopy was                        performed with moderate difficulty due to significant                        looping. Successful completion of the procedure was                        aided by changing the patient to a supine position,                        changing the patient to a prone position and using                        manual pressure. The patient tolerated the procedure                        well. The quality of the bowel preparation was good. Findings:      Multiple small to medium-mouthed diverticula were found in the sigmoid       colon, descending colon and transverse  colon.      A 22 mm polyp was found in the distal sigmoid colon. The polyp was       pedunculated. The polyp was removed with a hot snare. Resection and        retrieval were complete, retrieved with a roth net. To prevent bleeding       after the polypectomy, one hemostatic clip was successfully placed (MR       conditional). There was no bleeding at the end of the maneuver.      The digital rectal exam was normal.      The retroflexed view of the distal rectum and anal verge was normal and       showed no anal or rectal abnormalities.      Prominant lipomatous IC valve, biopsied for histology. Impression:           - Diverticulosis in the sigmoid colon, in the                        descending colon and in the transverse colon.                       - One 22 mm polyp in the distal sigmoid colon, about                        25-30 cm from the anal verge, removed with a hot snare.                        Resected and retrieved. Clip (MR conditional) was                        placed. Recommendation:       - Discharge patient to home.                       - Full liquid diet today.                       - Soft diet for 1 day, then advance as tolerated to                        advance diet as tolerated.                       - Discharge patient to home.                       - Await pathology results.                       - Telephone GI clinic for pathology results in 5 days. Procedure Code(s):    --- Professional ---                       [phone], Colonoscopy, flexible; with removal of tumor(s),                        polyp(s), or other lesion(s) by snare technique Diagnosis Code(s):    --- Professional ---                       Z12.11, Encounter for screening for malignant neoplasm  of colon                       D12.5, Benign neoplasm of sigmoid colon                       K57.30, Diverticulosis of large intestine without                        perforation or abscess without bleeding CPT copyright 2017 American Medical Association. All rights reserved. The codes documented in this report are preliminary and upon  coder review [place]  be revised to meet current compliance requirements. Lollie Sails, MD 11/19/2017 10:48:28 AM This report has been signed electronically. Number of Addenda: 0 Note Initiated On: 11/19/2017 8:58 AM Scope Withdrawal Time: 0 hours 30 minutes 12 seconds  Total Procedure Duration: 0 hours 42 minutes 12 seconds       [facility]

## 2017-11-19 NOTE — H&P (Signed)
Outpatient short stay form Pre-procedure 11/19/2017 9:03 AM Lollie Sails MD  Primary Physician: Frazier Richards, MD  Reason for visit: EGD and colonoscopy  History of present illness: Patient is a 67 year old male venting today as above.  He has personal history of adenomatous colon polyps with his last colonoscopy being done in 2012.  He also has a history of gas esophageal reflux of been taking over-the-counter medications such as 20 mg Nexium regularly for quite some time.  He is never had an EGD.  There is some mild cervical dysphagia but he does not regurgitate foods.  He tolerated his prep well.  He takes no aspirin or blood thinning agent.   Current Facility-Administered Medications:  .  0.9 %  sodium chloride infusion, , Intravenous, Continuous, Lollie Sails, MD, Last Rate: 20 mL/hr at 11/19/17 0808, 1,000 mL at 11/19/17 0808  Medications Prior to Admission  Medication Sig Dispense Refill Last Dose  . finasteride (PROSCAR) 5 MG tablet Take 5 mg by mouth daily.   11/18/2017 at Unknown time  . LISINOPRIL PO Take by mouth.   11/18/2017 at Unknown time  . lisinopril-hydrochlorothiazide (PRINZIDE,ZESTORETIC) 10-12.5 MG tablet Take 1 tablet by mouth daily.   11/18/2017 at Unknown time  . tamsulosin (FLOMAX) 0.4 MG CAPS capsule Take 0.4 mg by mouth.   11/18/2017 at Unknown time  . ciprofloxacin (CIPRO) 500 MG tablet Take 1 tablet (500 mg total) by mouth 2 (two) times daily. (Patient not taking: Reported on 11/19/2017) 14 tablet 0 Not Taking at Unknown time  . cyclobenzaprine (FLEXERIL) 10 MG tablet Take 10 mg by mouth 3 (three) times daily as needed for muscle spasms.   Not Taking at Unknown time  . esomeprazole (NEXIUM) 20 MG packet Take 20 mg by mouth daily before breakfast.   Not Taking at Unknown time     Allergies  Allergen Reactions  . Sulfa Antibiotics      Past Medical History:  Diagnosis Date  . Enlarged prostate   . GERD (gastroesophageal reflux disease)   . Headache     migraine without aura  . Hypercholesterolemia   . Hypertension     Review of systems:      Physical Exam    Heart and lungs: Regular rate and rhythm without rub or gallop, lungs are bilaterally clear.    HEENT: Normocephalic atraumatic eyes are anicteric    Other:    Pertinant exam for procedure: Soft nontender nondistended bowel sounds positive normoactive    Planned proceedures: EGD, colonoscopy and indicated procedures. I have discussed the risks benefits and complications of procedures to include not limited to bleeding, infection, perforation and the risk of sedation and the patient wishes to proceed.    Lollie Sails, MD Gastroenterology 11/19/2017  9:03 AM

## 2017-11-19 NOTE — Transfer of Care (Signed)
Immediate Anesthesia Transfer of Care Note  Patient: Timothy Nicholson  Procedure(s) Performed: ESOPHAGOGASTRODUODENOSCOPY (EGD) WITH PROPOFOL (N/A ) COLONOSCOPY WITH PROPOFOL (N/A )  Patient Location: PACU and Endoscopy Unit  Anesthesia Type:General  Level of Consciousness: awake  Airway & Oxygen Therapy: Patient Spontanous Breathing  Post-op Assessment: Report given to RN  Post vital signs: stable  Last Vitals:  Vitals Value Taken Time  BP    Temp    Pulse    Resp    SpO2      Last Pain:  Vitals:   11/19/17 0757  TempSrc: Tympanic  PainSc: 0-No pain         Complications: No apparent anesthesia complications

## 2017-11-23 ENCOUNTER — Other Ambulatory Visit: Payer: Self-pay | Admitting: Anatomic Pathology & Clinical Pathology

## 2017-12-01 LAB — SURGICAL PATHOLOGY

## 2017-12-23 ENCOUNTER — Encounter: Payer: Self-pay | Admitting: Genetics

## 2017-12-23 ENCOUNTER — Telehealth: Payer: Self-pay | Admitting: Genetics

## 2017-12-23 NOTE — Telephone Encounter (Signed)
New genetics referral received from Dr. Gustavo Lah for benign neoplasm of colon. Pt has been cld and  genetic counseling appt has been scheduled for the pt to see Ferol Luz on 10/7 at 4pm. Pt aware to arrive 30 minutes early. Letter mailed.

## 2018-01-18 ENCOUNTER — Inpatient Hospital Stay: Payer: Medicare Other | Attending: Genetic Counselor | Admitting: Genetics

## 2018-01-18 DIAGNOSIS — Z85038 Personal history of other malignant neoplasm of large intestine: Secondary | ICD-10-CM

## 2018-01-18 DIAGNOSIS — Z801 Family history of malignant neoplasm of trachea, bronchus and lung: Secondary | ICD-10-CM | POA: Diagnosis not present

## 2018-01-18 DIAGNOSIS — Z800 Family history of malignant neoplasm of digestive organs: Secondary | ICD-10-CM

## 2018-01-18 DIAGNOSIS — Z8042 Family history of malignant neoplasm of prostate: Secondary | ICD-10-CM | POA: Diagnosis not present

## 2018-01-18 DIAGNOSIS — Z803 Family history of malignant neoplasm of breast: Secondary | ICD-10-CM | POA: Diagnosis not present

## 2018-01-19 ENCOUNTER — Encounter: Payer: Self-pay | Admitting: Genetics

## 2018-01-19 DIAGNOSIS — Z801 Family history of malignant neoplasm of trachea, bronchus and lung: Secondary | ICD-10-CM | POA: Insufficient documentation

## 2018-01-19 DIAGNOSIS — Z8042 Family history of malignant neoplasm of prostate: Secondary | ICD-10-CM | POA: Insufficient documentation

## 2018-01-19 DIAGNOSIS — Z803 Family history of malignant neoplasm of breast: Secondary | ICD-10-CM | POA: Insufficient documentation

## 2018-01-19 DIAGNOSIS — Z800 Family history of malignant neoplasm of digestive organs: Secondary | ICD-10-CM | POA: Insufficient documentation

## 2018-01-19 DIAGNOSIS — Z85038 Personal history of other malignant neoplasm of large intestine: Secondary | ICD-10-CM

## 2018-01-19 HISTORY — DX: Personal history of other malignant neoplasm of large intestine: Z85.038

## 2018-01-19 NOTE — Progress Notes (Addendum)
REFERRING PROVIDER: Lollie Sails, MD [facility] [place], [place] [address]  PRIMARY PROVIDER:  Kirk Ruths, MD  PRIMARY REASON FOR VISIT:  1. Family history of colon cancer   2. Family history of lung cancer   3. Family history of breast cancer   4. Family history of prostate cancer   5. Family history of pancreatic cancer   6. Personal history of colon cancer     HISTORY OF PRESENT ILLNESS:   Mr. Timothy Nicholson, a 67 y.o. male, was seen for a [place] cancer genetics consultation at the request of Dr. Gustavo Lah due to a personal and family history of cancer.  Timothy Nicholson presents to clinic today to discuss the possibility of a hereditary predisposition to cancer, genetic testing, and to further clarify his future cancer risks, as well as potential cancer risks for family members.   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  His cancer did not require any additional treatment after removal of polyp.  He is being monitored by Dr. Gustavo Lah.  MMR IHC was performed and did not show any loss of MMR protein expression.   He has had several stomach polyps, and pathology reports from his 2012 colonoscopy report 6 fragments of tubular adenoma as well as some hyperplastic polyps. He also reports a history of a basal cell carcinoma on his nose.    Past Medical History:  Diagnosis Date  . Enlarged prostate   . Family history of breast cancer   . Family history of colon cancer   . Family history of lung cancer   . Family history of pancreatic cancer   . Family history of prostate cancer   . GERD (gastroesophageal reflux disease)   . Headache    migraine without aura  . Hypercholesterolemia   . Hypertension   . Personal history of colon cancer 01/19/2018    Past Surgical History:  Procedure Laterality Date  . COLONOSCOPY  08/27/2010  . COLONOSCOPY WITH PROPOFOL N/A  11/19/2017   Procedure: COLONOSCOPY WITH PROPOFOL;  Surgeon: Lollie Sails, MD;  Location: [facility] ENDOSCOPY;  Service: Endoscopy;  Laterality: N/A;  . ESOPHAGOGASTRODUODENOSCOPY (EGD) WITH PROPOFOL N/A 11/19/2017   Procedure: ESOPHAGOGASTRODUODENOSCOPY (EGD) WITH PROPOFOL;  Surgeon: Lollie Sails, MD;  Location: [facility] ENDOSCOPY;  Service: Endoscopy;  Laterality: N/A;  . TONSILLECTOMY      Social History   Socioeconomic History  . Marital status: Married    Spouse name: Not on file  . Number of children: Not on file  . Years of education: Not on file  . Highest education level: Not on file  Occupational History  . Not on file  Social Needs  . Financial resource strain: Not on file  . Food insecurity:    Worry: Not on file    Inability: Not on file  . Transportation needs:    Medical: Not on file    Non-medical: Not on file  Tobacco Use  . Smoking status: Never Smoker  . Smokeless tobacco: Never Used  Substance and Sexual Activity  . Alcohol use: Yes  . Drug use: No  . Sexual activity: Not on file  Lifestyle  . Physical activity:    Days per week: Not on file    Minutes per session: Not on file  . Stress: Not on file  Relationships  . Social connections:    Talks on phone: Not  on file    Gets together: Not on file    Attends religious service: Not on file    Active member of club or organization: Not on file    Attends meetings of clubs or organizations: Not on file    Relationship status: Not on file  Other Topics Concern  . Not on file  Social History Narrative  . Not on file     FAMILY HISTORY:  We obtained a detailed, 4-generation family history.  Significant diagnoses are listed below: Family History  Problem Relation Age of Onset  . Breast cancer Mother 52  . Lung cancer Father 24       hx of smoking  . Colon polyps Father        'had 63 polyps on his last colonocospy'  . Colon cancer Maternal Uncle 75  . Lung cancer Paternal Aunt 80       hx  smoking  . Pancreatic cancer Paternal Uncle        hx of alcohol abuse  . Heart disease Maternal Grandfather   . Aortic aneurysm Maternal Grandfather   . Kidney failure Paternal Grandmother   . Prostate cancer Maternal Uncle        metastatic, died of this cancer in 16's/80's  . Cirrhosis Paternal Uncle        hx of alcohol abuse  . Breast cancer Cousin        50's  . Breast cancer Cousin        50's  . Breast cancer Cousin        83's    Timothy Nicholson has a 47 year-old daughter with no history of cancer.  He has 2 grandchildren.  Timothy Nicholson has a sister who is 11 with no history of cancer. She recently had a colonoscopy that did not show any cancer, he does not know if they found any polyps or not.    Timothy Nicholson father: died of lung cancer at 66.  He had a history of smoking.  His last colonoscopy revealed 9 colon polyp, the patient does not know his polyp history before this last c-scope.   Paternal Aunts/Uncles: 3 paternal aunts 4 paternal uncles.  1 paternal uncle died of pancreatic cancer dx in his 64's/60's.  He had a history of alcohol abuse.  Another paternal uncle died of cirrhosis of the liver (hx of alcohol abuse). 1 paternal aunt died of lung cancer at 22, she had a hx of smoking. Paternal cousins: no known history of cancer Paternal grandfather: died at 71 with no hx of cancer Paternal grandmother:died at 12 due to kidney failure  Timothy Nicholson mother: died at 66, she had breast cancer dx at 45 that recurred and was metastatic.  Maternal Aunts/Uncles: 12 maternal aunts/uncles total. 1 maternal uncle had colon cancer in his 84's, 1 paternal uncle died of metastatic prostate cancer in his 57's/80's Maternal cousins: 3 maternal cousins, (not siblings of each other) had breast cancer in their 26's Maternal grandfather: died of heart failure/aortic aneurysm Maternal grandmother:died at 18 due to age related disease.   Timothy Nicholson is unaware of previous family history of genetic testing for  hereditary cancer risks. Patient's maternal ancestors are of N. European descent, and paternal ancestors are of [place] descent. There is no reported Ashkenazi Jewish ancestry. There is no known consanguinity.  GENETIC COUNSELING ASSESSMENT: Galileo Colello Barajas is a 67 y.o. male with a personal and family history which is somewhat suggestive of a Hereditary  Cancer Predisposition Syndrome. We, therefore, discussed and recommended the following at today's visit.   DISCUSSION: We reviewed the characteristics, features and inheritance patterns of hereditary cancer syndromes. We also discussed genetic testing, including the appropriate family members to test, the process of testing, insurance coverage and turn-around-time for results. We discussed the implications of a negative, positive and/or variant of uncertain significant result. We recommended Mr. Rawdon pursue genetic testing for the Common Hereditary Cancers gene panel + EGFR.  The Common Hereditary Cancer Panel offered by [facility] includes sequencing and/or deletion duplication testing of the following 55 genes: APC, ATM, AXIN2, BARD1, BLM, BMPR1A, BRCA1, BRCA2, BRIP1, BUB1B, CDH1, CDK4, CDKN2A, CEP57, CHEK2, CTNNA1, DICER1, ENG, EPCAM, GALNT12, GREM1, HOXB13, KIT, MEN1, MLH1, MLH3, MSH2, MSH3, MSH6, MUTYH, NBN, NF1, NTHL1, PALB2, PDGFRA, PMS2, POLD1, POLE, PTEN, RAD50, RAD51C, RAD51D, RNF43, RPS20, SDHA, SDHB, SDHC, SDHD, SMAD4, SMARCA4, STK11, TP53, TSC1, TSC2, VHL   We discussed that only 5-10% of cancers are associated with a Hereditary Cancer Predisposition Syndrome.  The most common hereditary cancer syndrome associated with colon cancer is Lynch Syndrome.  Lynch Syndrome is caused by mutations in the genes: MLH1, MSH2, MSH6, PMS2 and EPCAM.  This syndrome increases the risk for colon, uterine, ovarian and stomach cancers, as well as others.  Families with Lynch Syndrome tend to have multiple family members with these cancers, typically diagnosed under age 27,  and diagnoses in multiple generations.    We discussed that there are several other genes that are associated with an increased risk for colon cancer and increased polyp burden (MUTYH, APC, POLE, CHEK2, etc.) We also dicussed that there are many genes that cause many different types of cancer risks.    Mr. Smail had intact Immuno-histo chemistry (IHC) performed on his tumor.  These proteins were intact, so the suspicion for Lynch Syndrome is low.  However, there are other genes and synromes associated with an increased risk for colon and other types of cancer.   We also briefly discussed hereditary breast and ovarian cancer due to the family history of breast and prostate cancer on his maternal side of the family.   We discussed that if he is found to have a mutation in one of these genes, it Cordial impact future medical management recommendations such as increased cancer screenings and consideration of risk reducing surgeries.  A positive result could also have implications for the patient's family members.  A Negative result would mean we were unable to identify a hereditary component to her cancer, but does not rule out the possibility of a hereditary basis for her cancer.  There could be mutations that are undetectable by current technology, or in genes not yet tested or identified to increase cancer risk.    We discussed the potential to find a Variant of Uncertain Significance or VUS.  These are variants that have not yet been identified as pathogenic or benign, and it is unknown if this variant is associated with increased cancer risk or if this is a normal finding.  Most VUS's are reclassified to benign or likely benign.   It should not be used to make medical management decisions. With time, we suspect the lab will determine the significance of any VUS's identified if any.   Based on Mr. Swetz personal and family history of cancer, he meets medical criteria for genetic testing. Despite that he  meets criteria, he Bonnette still have an out of pocket cost. The laboratory can provide an estimate of his OOP cost.  hospital was provided the contact information for the laboratory if hospital has further questions.   PLAN: After considering the risks, benefits, and limitations, Mr. Soltero  provided informed consent to pursue genetic testing and the saliva sample was sent to [facility] for analysis of the Common Hereditary Cancers Panel. Results should be available within approximately 2-3 weeks' time, at which point they will be disclosed by telephone to Mr. Zimny, as will any additional recommendations warranted by these results. Mr. Uptain will receive a summary of his genetic counseling visit and a copy of his results once available. This information will also be available in Epic. We encouraged Mr. Azucena to remain in contact with cancer genetics annually so that we can continuously update the family history and inform him of any changes in cancer genetics and testing that [place] be of benefit for his family. Mr. Vancott questions were answered to his satisfaction today. Our contact information was provided should additional questions or concerns arise.  Based on Mr. Siddiqi family history, we recommended his maternal and paternal relatives also, have genetic counseling and testing. Mr. Rosario will let us know if we can be of any assistance in coordinating genetic counseling and/or testing for this family member.   Lastly, we encouraged Mr. Boothe to remain in contact with cancer genetics annually so that we can continuously update the family history and inform him of any changes in cancer genetics and testing that [place] be of benefit for this family.   Mr.  Murty questions were answered to his satisfaction today. Our contact information was provided should additional questions or concerns arise. Thank you for the referral and allowing [place] to share in the care of your patient.   Tana Felts, MS, [facility] Certified  Genetic Counselor [email or web address] phone: [phone]  The patient was seen for a total of 40 minutes in face-to-face genetic counseling. The patient was accompanied today by his wife.

## 2018-02-01 ENCOUNTER — Ambulatory Visit: Payer: Self-pay | Admitting: Genetics

## 2018-02-01 ENCOUNTER — Telehealth: Payer: Self-pay | Admitting: Genetics

## 2018-02-01 ENCOUNTER — Encounter: Payer: Self-pay | Admitting: Genetics

## 2018-02-01 DIAGNOSIS — Z1379 Encounter for other screening for genetic and chromosomal anomalies: Secondary | ICD-10-CM

## 2018-02-01 DIAGNOSIS — Z803 Family history of malignant neoplasm of breast: Secondary | ICD-10-CM

## 2018-02-01 DIAGNOSIS — Z85038 Personal history of other malignant neoplasm of large intestine: Secondary | ICD-10-CM

## 2018-02-01 DIAGNOSIS — Z8042 Family history of malignant neoplasm of prostate: Secondary | ICD-10-CM

## 2018-02-01 DIAGNOSIS — Z801 Family history of malignant neoplasm of trachea, bronchus and lung: Secondary | ICD-10-CM

## 2018-02-01 DIAGNOSIS — Z800 Family history of malignant neoplasm of digestive organs: Secondary | ICD-10-CM

## 2018-02-01 NOTE — Progress Notes (Signed)
HPI:  Mr. Dunphy was previously seen in the [facility] on 01/18/2018 due to a personal and family history of cancer and concerns regarding a hereditary predisposition to cancer. Please refer to our prior cancer genetics clinic note for more information regarding Mr. Paver medical, social and family histories, and our assessment and recommendations, at the time. Mr. Lomax recent genetic test results were disclosed to him, as well as recommendations warranted by these results. These results and recommendations are discussed in more detail below.  CANCER HISTORY:   In 2019, at the age of 67, Mr. 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  His cancer did not require any additional treatment after removal of polyp.  He is being monitored by Dr. Gustavo Lah.  MMR IHC was performed and did not show any loss of MMR protein expression.   He has had several stomach polyps, and pathology reports from his 2012 colonoscopy report 6 fragments of tubular adenoma as well as some hyperplastic polyps. He also reports a history of a basal cell carcinoma on his nose.    FAMILY HISTORY:  We obtained a detailed, 4-generation family history.  Significant diagnoses are listed below: Family History  Problem Relation Age of Onset  . Breast cancer Mother 51  . Lung cancer Father 41       hx of smoking  . Colon polyps Father        'had 15 polyps on his last colonocospy'  . Colon cancer Maternal Uncle 75  . Lung cancer Paternal Aunt 31       hx smoking  . Pancreatic cancer Paternal Uncle        hx of alcohol abuse  . Heart disease Maternal Grandfather   . Aortic aneurysm Maternal Grandfather   . Kidney failure Paternal Grandmother   . Prostate cancer Maternal Uncle        metastatic, died of this cancer in 32's/80's  . Cirrhosis Paternal Uncle        hx of alcohol abuse  . Breast cancer Cousin        50's  . Breast cancer Cousin        50's    . Breast cancer Cousin        73's    Mr. Garraway has a 45 year-old daughter with no history of cancer.  He has 2 grandchildren.  Mr. Borgen has a sister who is 27 with no history of cancer. She recently had a colonoscopy that did not show any cancer, he does not know if they found any polyps or not.    Mr. Bolger father: died of lung cancer at 60.  He had a history of smoking.  His last colonoscopy revealed 9 colon polyp, the patient does not know his polyp history before this last c-scope.   Paternal Aunts/Uncles: 3 paternal aunts 4 paternal uncles.  1 paternal uncle died of pancreatic cancer dx in his 59's/60's.  He had a history of alcohol abuse.  Another paternal uncle died of cirrhosis of the liver (hx of alcohol abuse). 1 paternal aunt died of lung cancer at 77, she had a hx of smoking. Paternal cousins: no known history of cancer Paternal grandfather: died at 77 with no hx of cancer Paternal grandmother:died at 53 due to kidney failure  Mr. Redditt mother: died at 32, she had breast cancer dx at 49 that recurred and was metastatic.  Maternal Aunts/Uncles: 12 maternal aunts/uncles  total. 1 maternal uncle had colon cancer in his 102's, 1 paternal uncle died of metastatic prostate cancer in his 52's/80's Maternal cousins: 3 maternal cousins, (not siblings of each other) had breast cancer in their 75's Maternal grandfather: died of heart failure/aortic aneurysm Maternal grandmother:died at 2 due to age related disease.   Mr. Gabbard is unaware of previous family history of genetic testing for hereditary cancer risks. Patient's maternal ancestors are of N. European descent, and paternal ancestors are of [place] descent. There is no reported Ashkenazi Jewish ancestry. There is no known consanguinity.  GENETIC TEST RESULTS: Genetic testing performed through [facility]'s Common Hereditary Cancers Panel + EGFR reported out on 01/29/2018 showed no pathogenic mutations. The Common Hereditary Cancer Panel + EGFR  offered by [facility] includes sequencing and/or deletion duplication testing of the following 56 genes: APC, ATM, AXIN2, BARD1, BLM, BMPR1A, BRCA1, BRCA2, BRIP1, BUB1B, CDH1, CDK4, CDKN2A, CEP57, CHEK2, CTNNA1, DICER1, ENG, EGFR,EPCAM, GALNT12, GREM1, HOXB13, KIT, MEN1, MLH1, MLH3, MSH2, MSH3, MSH6, MUTYH, NBN, NF1, NTHL1, PALB2, PDGFRA, PMS2, POLD1, POLE, PTEN, RAD50, RAD51C, RAD51D, RNF43, RPS20, SDHA, SDHB, SDHC, SDHD, SMAD4, SMARCA4, STK11, TP53, TSC1, TSC2, VHL.   A variant of uncertain significance (VUS) in a gene called RNF43 was also noted. [id number] (p.[id number])  The test report will be scanned into EPIC and will be located under the Molecular Pathology section of the Results Review tab. A portion of the result report is included below for reference.     We discussed with Mr. Bruhn that because current genetic testing is not perfect, it is possible there [place] be a gene mutation in one of these genes that current testing cannot detect, but that chance is small.  We also discussed, that there could be another gene that has not yet been discovered, or that we have not yet tested, that is responsible for the cancer diagnoses in the family. It is also possible there is a hereditary cause for the cancer in the family that Mr. Eberly did not inherit and therefore was not identified in his testing.  Therefore, it is important to remain in touch with cancer genetics in the future so that we can continue to offer Mr. Pittmon the most up to date genetic testing.   Regarding the VUS in RNF43: At this time, it is unknown if this variant is associated with increased cancer risk or if this is a normal finding, but most variants such as this get reclassified to being inconsequential. It should not be used to make medical management decisions. With time, we suspect the lab will determine the significance of this variant, if any. If we do learn more about it, we will try to contact Mr. Brune to discuss it further. However, it  is important to stay in touch with [place] periodically and keep the address and phone number up to date.  ADDITIONAL GENETIC TESTING: We discussed with Mr. Gagliano that his genetic testing was fairly extensive.  If there are are genes identified to increase cancer risk that can be analyzed in the future, we would be happy to discuss and coordinate this testing at that time.    CANCER SCREENING RECOMMENDATIONS: Mr. Pickel test result is considered negative (normal).  This means that we have not identified a hereditary cause for his personal and family history of cancer at this time.   While reassuring, this does not definitively rule out a hereditary predisposition to cancer. It is still possible that there could be genetic mutations that are undetectable by  current technology, or genetic mutations in genes that have not been tested or identified to increase cancer risk.  Therefore, it is recommended he continue to follow the cancer management and screening guidelines provided by his oncology and primary healthcare provider. An individual's cancer risk is not determined by genetic test results alone.  Overall cancer risk assessment includes additional factors such as personal medical history, family history, etc.  These should be used to make a personalized plan for cancer prevention and surveillance.    RECOMMENDATIONS FOR FAMILY MEMBERS:  Relatives in this family might be at some increased risk of developing cancer, over the general population risk, simply due to the family history of cancer.  We recommended women in this family have a yearly mammogram beginning at age 33, or 18 years younger than the earliest onset of cancer, an annual clinical breast exam, and perform monthly breast self-exams. Women in this family should also have a gynecological exam as recommended by their primary provider. All family members should have a colonoscopy as directed by their doctors.  We recommended his siblings and children all  have colonoscopy every 5 years starting at 40(or as directed by their healthcare providers).   All family members should inform their physicians about the family history of cancer so their doctors can make the most appropriate screening recommendations for them.   It is also possible there is a hereditary cause for the cancer in Mr. Mihelich family that he did not inherit and therefore was not identified in him.   Therefore, we recommended other relatives (especially those affected with cancer) also consider genetic counseling and testing. Mr. Gobert will let us know if we can be of any assistance in coordinating genetic counseling and/or testing for these family members.   FOLLOW-UP: Lastly, we discussed with Mr. Guillet that cancer genetics is a rapidly advancing field and it is possible that new genetic tests will be appropriate for him and/or his family members in the future. We encouraged him to remain in contact with cancer genetics on an annual basis so we can update his personal and family histories and let him know of advances in cancer genetics that Kimber benefit this family.   Our contact number was provided. Mr. Couser questions were answered to his satisfaction, and he knows he is welcome to call us at anytime with additional questions or concerns.   Ferol Luz, MS, [facility] Certified Genetic Counselor [email or web address]

## 2018-02-01 NOTE — Telephone Encounter (Signed)
Revealed negative genetic testing.  Revealed that a VUS in RNF43 was identified.   This normal result is reassuring and indicates that it is unlikely Timothy Nicholson cancer is due to a hereditary cause.  It is unlikely that there is an increased risk of another cancer due to a mutation in one of these genes.  However, genetic testing is not perfect, and cannot definitively rule out a hereditary cause.  It will be important for him to keep in contact with genetics to learn if any additional testing Speth be needed in the future.     We recommended his siblings/children have colonoscopies starting at 40 every 5 years (or as directed by their healthcare providers) we also recommended other family members (especially those affected with cancer) also consider doing genetic testing.

## 2018-04-29 ENCOUNTER — Ambulatory Visit: Payer: Medicare Other | Admitting: Anesthesiology

## 2018-04-29 ENCOUNTER — Encounter
Admission: RE | Disposition: A | Discharge status: Home / Self Care | Payer: Self-pay | Source: Home / Self Care | Attending: Gastroenterology

## 2018-04-29 ENCOUNTER — Encounter: Payer: Self-pay | Admitting: *Deleted

## 2018-04-29 ENCOUNTER — Ambulatory Visit
Admission: RE | Admit: 2018-04-29 | Discharge: 2018-04-29 | Disposition: A | Discharge status: Home / Self Care | Payer: Medicare Other | Attending: Gastroenterology | Admitting: Gastroenterology

## 2018-04-29 ENCOUNTER — Encounter: Payer: Self-pay | Admitting: Anesthesiology

## 2018-04-29 DIAGNOSIS — Z882 Allergy status to sulfonamides status: Secondary | ICD-10-CM | POA: Insufficient documentation

## 2018-04-29 DIAGNOSIS — Z8249 Family history of ischemic heart disease and other diseases of the circulatory system: Secondary | ICD-10-CM | POA: Diagnosis not present

## 2018-04-29 DIAGNOSIS — Z79899 Other long term (current) drug therapy: Secondary | ICD-10-CM | POA: Diagnosis not present

## 2018-04-29 DIAGNOSIS — N400 Enlarged prostate without lower urinary tract symptoms: Secondary | ICD-10-CM | POA: Insufficient documentation

## 2018-04-29 DIAGNOSIS — M1990 Unspecified osteoarthritis, unspecified site: Secondary | ICD-10-CM | POA: Diagnosis not present

## 2018-04-29 DIAGNOSIS — Z08 Encounter for follow-up examination after completed treatment for malignant neoplasm: Secondary | ICD-10-CM | POA: Diagnosis present

## 2018-04-29 DIAGNOSIS — K219 Gastro-esophageal reflux disease without esophagitis: Secondary | ICD-10-CM | POA: Diagnosis not present

## 2018-04-29 DIAGNOSIS — I10 Essential (primary) hypertension: Secondary | ICD-10-CM | POA: Insufficient documentation

## 2018-04-29 DIAGNOSIS — Z86010 Personal history of colonic polyps: Secondary | ICD-10-CM | POA: Insufficient documentation

## 2018-04-29 DIAGNOSIS — K5730 Diverticulosis of large intestine without perforation or abscess without bleeding: Secondary | ICD-10-CM | POA: Insufficient documentation

## 2018-04-29 HISTORY — PX: COLONOSCOPY WITH PROPOFOL: SHX5780

## 2018-04-29 HISTORY — DX: Unspecified osteoarthritis, unspecified site: M19.90

## 2018-04-29 SURGERY — COLONOSCOPY WITH PROPOFOL
Anesthesia: General

## 2018-04-29 MED ORDER — PROPOFOL 500 MG/50ML IV EMUL
INTRAVENOUS | Status: DC | PRN
  Administered 2018-04-29: 120 ug/kg/min via INTRAVENOUS

## 2018-04-29 MED ORDER — SODIUM CHLORIDE 0.9 % IV SOLN
INTRAVENOUS | Status: DC
  Administered 2018-04-29: 1000 mL via INTRAVENOUS

## 2018-04-29 MED ORDER — EPHEDRINE SULFATE 50 MG/ML IJ SOLN
INTRAMUSCULAR | Status: DC | PRN
  Administered 2018-04-29 (×2): 5 mg via INTRAVENOUS

## 2018-04-29 MED ORDER — FENTANYL CITRATE (PF) 100 MCG/2ML IJ SOLN
INTRAMUSCULAR | Status: DC | PRN
  Administered 2018-04-29: 50 ug via INTRAVENOUS

## 2018-04-29 MED ORDER — SODIUM CHLORIDE 0.9 % IV SOLN: INTRAVENOUS | Status: DC

## 2018-04-29 MED ORDER — MIDAZOLAM HCL 2 MG/2ML IJ SOLN
INTRAMUSCULAR | Status: AC
  Filled 2018-04-29: qty 2

## 2018-04-29 MED ORDER — PROPOFOL 500 MG/50ML IV EMUL
INTRAVENOUS | Status: AC
  Filled 2018-04-29: qty 50

## 2018-04-29 MED ORDER — FENTANYL CITRATE (PF) 100 MCG/2ML IJ SOLN
INTRAMUSCULAR | Status: AC
  Filled 2018-04-29: qty 2

## 2018-04-29 MED ORDER — EPHEDRINE SULFATE 50 MG/ML IJ SOLN
INTRAMUSCULAR | Status: AC
  Filled 2018-04-29: qty 1

## 2018-04-29 MED ORDER — MIDAZOLAM HCL 2 MG/2ML IJ SOLN
INTRAMUSCULAR | Status: DC | PRN
  Administered 2018-04-29: 2 mg via INTRAVENOUS

## 2018-04-29 NOTE — Anesthesia Post-op Follow-up Note (Signed)
Anesthesia QCDR form completed.        

## 2018-04-29 NOTE — Transfer of Care (Signed)
Immediate Anesthesia Transfer of Care Note  Patient: Onofre Gains Fabel  Procedure(s) Performed: COLONOSCOPY WITH PROPOFOL (N/A )  Patient Location: PACU  Anesthesia Type:General  Level of Consciousness: awake and sedated  Airway & Oxygen Therapy: Patient Spontanous Breathing and Patient connected to nasal cannula oxygen  Post-op Assessment: Report given to RN and Post -op Vital signs reviewed and stable  Post vital signs: Reviewed and stable  Last Vitals:  Vitals Value Taken Time  BP    Temp    Pulse    Resp    SpO2      Last Pain:  Vitals:   04/29/18 0814  TempSrc: Tympanic  PainSc: 0-No pain      Patients Stated Pain Goal: 0 (02/12/27 1188)  Complications: No apparent anesthesia complications

## 2018-04-29 NOTE — Anesthesia Procedure Notes (Signed)
Performed by: Cook-Martin, Carlissa Pesola Pre-anesthesia Checklist: Patient identified, Emergency Drugs available, Suction available, Patient being monitored and Timeout performed Patient Re-evaluated:Patient Re-evaluated prior to induction Oxygen Delivery Method: Nasal cannula Preoxygenation: Pre-oxygenation with 100% oxygen Induction Type: IV induction Placement Confirmation: positive ETCO2 and CO2 detector       

## 2018-04-29 NOTE — H&P (Signed)
Outpatient short stay form Pre-procedure 04/29/2018 8:39 AM Timothy Sails MD  Primary Physician: Frazier Richards, MD  Reason for visit: Colonoscopy  History of present illness: Patient is a 68 year old male presenting today for colonoscopy in regards to his personal history of adenomatous colon polyps.  Patient had a colonoscopy on 11/19/2017 with finding of a 22 mm polyp resected from the distal sigmoid colon.                                                                     There was a pedunculated polyp to have invasive adenocarcinoma.  The margin was clear.  There were no further recommendations from oncology.  He is presenting today for recheck on the site and repeat colonoscopy.  He tolerated his prep, he takes no aspirin or blood thinning agent.    Current Facility-Administered Medications:  .  0.9 %  sodium chloride infusion, , Intravenous, Continuous, Timothy Sails, MD .  0.9 %  sodium chloride infusion, , Intravenous, Continuous, Timothy Sails, MD  Medications Prior to Admission  Medication Sig Dispense Refill Last Dose  . cyclobenzaprine (FLEXERIL) 10 MG tablet Take 10 mg by mouth 3 (three) times daily as needed for muscle spasms.   04/29/2018 at 0130am  . finasteride (PROSCAR) 5 MG tablet Take 5 mg by mouth daily.   04/29/2018 at Unknown time  . LISINOPRIL PO Take by mouth.   04/29/2018 at Unknown time  . lisinopril-hydrochlorothiazide (PRINZIDE,ZESTORETIC) 10-12.5 MG tablet Take 1 tablet by mouth daily.   04/29/2018 at Unknown time  . pantoprazole (PROTONIX) 40 MG tablet Take 40 mg by mouth daily.     . ciprofloxacin (CIPRO) 500 MG tablet Take 1 tablet (500 mg total) by mouth 2 (two) times daily. (Patient not taking: Reported on 11/19/2017) 14 tablet 0 Not Taking at Unknown time  . esomeprazole (NEXIUM) 20 MG packet Take 20 mg by mouth daily before breakfast.   Not Taking at Unknown time  . tamsulosin (FLOMAX) 0.4 MG CAPS capsule Take 0.4 mg by mouth.   11/18/2017 at  Unknown time     Allergies  Allergen Reactions  . Sulfa Antibiotics      Past Medical History:  Diagnosis Date  . Arthritis    osteo  . Enlarged prostate   . Family history of breast cancer   . Family history of colon cancer   . Family history of lung cancer   . Family history of pancreatic cancer   . Family history of prostate cancer   . GERD (gastroesophageal reflux disease)   . Headache    migraine without aura  . Hypercholesterolemia   . Hypertension   . Personal history of colon cancer 01/19/2018    Review of systems:      Physical Exam    Heart and lungs: Regular rate and rhythm without rub or gallop, lungs are bilaterally clear.    HEENT: Normocephalic atraumatic eyes are anicteric    Other:    Pertinant exam for procedure: Soft nontender nondistended bowel sounds positive normoactive.    Planned proceedures: Colonoscopy and indicated procedures. I have discussed the risks benefits and complications of procedures to include not limited to bleeding, infection, perforation and the risk of sedation and the patient  wishes to proceed.    Timothy Sails, MD Gastroenterology 04/29/2018  8:39 AM

## 2018-04-29 NOTE — Anesthesia Postprocedure Evaluation (Signed)
Anesthesia Post Note  Patient: Timothy Nicholson  Procedure(s) Performed: COLONOSCOPY WITH PROPOFOL (N/A )  Patient location during evaluation: PACU Anesthesia Type: General Level of consciousness: awake and alert Pain management: pain level controlled Vital Signs Assessment: post-procedure vital signs reviewed and stable Respiratory status: spontaneous breathing, nonlabored ventilation, respiratory function stable and patient connected to nasal cannula oxygen Cardiovascular status: blood pressure returned to baseline and stable Postop Assessment: no apparent nausea or vomiting Anesthetic complications: no     Last Vitals:  Vitals:   04/29/18 0909 04/29/18 0910  BP:  102/66  Pulse:    Resp:    Temp: (!) 36.1 C     Last Pain:  Vitals:   04/29/18 0909  TempSrc: Tympanic  PainSc:                  Molli Barrows

## 2018-04-29 NOTE — Anesthesia Preprocedure Evaluation (Signed)
Anesthesia Evaluation  Patient identified by MRN, date of birth, ID band Patient awake    Reviewed: Allergy & Precautions, H&P , NPO status , Patient's Chart, lab work & pertinent test results, reviewed documented beta blocker date and time   Airway Mallampati: II   Neck ROM: full    Dental  (+) Poor Dentition   Pulmonary neg pulmonary ROS,    Pulmonary exam normal        Cardiovascular Exercise Tolerance: Good hypertension, On Medications negative cardio ROS Normal cardiovascular exam Rhythm:regular Rate:Normal     Neuro/Psych  Headaches, negative psych ROS   GI/Hepatic Neg liver ROS, GERD  Medicated,  Endo/Other  negative endocrine ROS  Renal/GU negative Renal ROS  negative genitourinary   Musculoskeletal   Abdominal   Peds  Hematology negative hematology ROS (+)   Anesthesia Other Findings Past Medical History: No date: Arthritis     Comment:  osteo No date: Enlarged prostate No date: Family history of breast cancer No date: Family history of colon cancer No date: Family history of lung cancer No date: Family history of pancreatic cancer No date: Family history of prostate cancer No date: GERD (gastroesophageal reflux disease) No date: Headache     Comment:  migraine without aura No date: Hypercholesterolemia No date: Hypertension 01/19/2018: Personal history of colon cancer Past Surgical History: 08/27/2010: COLONOSCOPY     Comment:  Dr. Gustavo Lah Tubular adenomas, FH polyps 11/19/2017: COLONOSCOPY WITH PROPOFOL; N/A     Comment:  Procedure: COLONOSCOPY WITH PROPOFOL;  Surgeon:               Lollie Sails, MD;  Location[facility] ENDOSCOPY;                Service: Endoscopy;  Laterality: N/A; 11/19/2017: ESOPHAGOGASTRODUODENOSCOPY (EGD) WITH PROPOFOL; N/A     Comment:  Procedure: ESOPHAGOGASTRODUODENOSCOPY (EGD) WITH               PROPOFOL;  Surgeon: Lollie Sails, MD;  Location:   [facility] ENDOSCOPY;  Service: Endoscopy;  Laterality: N/A; No date: TONSILLECTOMY BMI    Body Mass Index:  25.10 kg/m     Reproductive/Obstetrics negative OB ROS                             Anesthesia Physical Anesthesia Plan  ASA: III  Anesthesia Plan: General   Post-op Pain Management:    Induction:   PONV Risk Score and Plan:   Airway Management Planned:   Additional Equipment:   Intra-op Plan:   Post-operative Plan:   Informed Consent: I have reviewed the patients History and Physical, chart, labs and discussed the procedure including the risks, benefits and alternatives for the proposed anesthesia with the patient or authorized representative who has indicated his/her understanding and acceptance.     Dental Advisory Given  Plan Discussed with: CRNA  Anesthesia Plan Comments:         Anesthesia Quick Evaluation

## 2018-04-29 NOTE — Op Note (Signed)
[facility] Gastroenterology Patient Name: Timothy Nicholson Procedure Date: 04/29/2018 8:19 AM MRN: [id number] Account #: [id number] Date of Birth: [birth year] Admit Type: Outpatient Age: 68 Room: [facility] ENDO ROOM 1 Gender: Male Note Status: Finalized Procedure:            Colonoscopy Indications:          Follow-up for history of colon polyps with carcinoma in                        situ Providers:            Lollie Sails, MD Referring MD:         Ocie Cornfield. Ouida Sills MD, MD (Referring MD) Medicines:            Monitored Anesthesia Care Complications:        No immediate complications. Procedure:            Pre-Anesthesia Assessment:                       - ASA Grade Assessment: III - A patient with severe                        systemic disease.                       After obtaining informed consent, the colonoscope was                        passed under direct vision. Throughout the procedure,                        the patient's blood pressure, pulse, and oxygen                        saturations were monitored continuously. The                        Colonoscope was introduced through the anus with the                        intention of advancing to the cecum. The scope was                        advanced to the sigmoid colon before the procedure was                        aborted. Medications were given. The colonoscopy was                        extremely difficult due to poor bowel prep with stool                        present. Findings:      Multiple medium-mouthed diverticula were found in the sigmoid colon.      Extensive amounts of semi-liquid solid stool was found in the rectum and       in the sigmoid colon, precluding visualization. Impression:           - Diverticulosis in the sigmoid colon.                       -  Stool in the rectum and in the sigmoid colon.                       - No specimens collected. Recommendation:       - Discharge  patient to home.                       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 All rights reserved. The codes documented in this report are preliminary and upon coder review [place]  be revised to meet current compliance requirements. Lollie Sails, MD 04/29/2018 9:08:01 AM This report has been signed electronically. Number of Addenda: 0 Note Initiated On: 04/29/2018 8:19 AM Total Procedure Duration: 0 hours 9 minutes 23 seconds       [facility]

## 2018-04-30 ENCOUNTER — Encounter: Payer: Self-pay | Admitting: Gastroenterology

## 2018-04-30 ENCOUNTER — Ambulatory Visit
Admission: RE | Admit: 2018-04-30 | Discharge: 2018-04-30 | Disposition: A | Discharge status: Home / Self Care | Payer: Medicare Other | Attending: Gastroenterology | Admitting: Gastroenterology

## 2018-04-30 ENCOUNTER — Encounter
Admission: RE | Disposition: A | Discharge status: Home / Self Care | Payer: Self-pay | Source: Home / Self Care | Attending: Gastroenterology

## 2018-04-30 ENCOUNTER — Ambulatory Visit: Payer: Medicare Other | Admitting: Anesthesiology

## 2018-04-30 DIAGNOSIS — D123 Benign neoplasm of transverse colon: Secondary | ICD-10-CM | POA: Diagnosis not present

## 2018-04-30 DIAGNOSIS — K219 Gastro-esophageal reflux disease without esophagitis: Secondary | ICD-10-CM | POA: Insufficient documentation

## 2018-04-30 DIAGNOSIS — Z86010 Personal history of colonic polyps: Secondary | ICD-10-CM | POA: Insufficient documentation

## 2018-04-30 DIAGNOSIS — I10 Essential (primary) hypertension: Secondary | ICD-10-CM | POA: Diagnosis not present

## 2018-04-30 DIAGNOSIS — E785 Hyperlipidemia, unspecified: Secondary | ICD-10-CM | POA: Insufficient documentation

## 2018-04-30 DIAGNOSIS — D125 Benign neoplasm of sigmoid colon: Secondary | ICD-10-CM | POA: Diagnosis not present

## 2018-04-30 DIAGNOSIS — K6389 Other specified diseases of intestine: Secondary | ICD-10-CM | POA: Insufficient documentation

## 2018-04-30 DIAGNOSIS — Z1211 Encounter for screening for malignant neoplasm of colon: Secondary | ICD-10-CM | POA: Diagnosis present

## 2018-04-30 DIAGNOSIS — M1990 Unspecified osteoarthritis, unspecified site: Secondary | ICD-10-CM | POA: Insufficient documentation

## 2018-04-30 DIAGNOSIS — D124 Benign neoplasm of descending colon: Secondary | ICD-10-CM | POA: Diagnosis not present

## 2018-04-30 DIAGNOSIS — Z85038 Personal history of other malignant neoplasm of large intestine: Secondary | ICD-10-CM | POA: Diagnosis not present

## 2018-04-30 DIAGNOSIS — Z79899 Other long term (current) drug therapy: Secondary | ICD-10-CM | POA: Diagnosis not present

## 2018-04-30 DIAGNOSIS — N400 Enlarged prostate without lower urinary tract symptoms: Secondary | ICD-10-CM | POA: Diagnosis not present

## 2018-04-30 DIAGNOSIS — Z882 Allergy status to sulfonamides status: Secondary | ICD-10-CM | POA: Diagnosis not present

## 2018-04-30 HISTORY — DX: Dorsalgia, unspecified: M54.9

## 2018-04-30 HISTORY — DX: Benign prostatic hyperplasia with lower urinary tract symptoms: N40.1

## 2018-04-30 HISTORY — DX: Hyperlipidemia, unspecified: E78.5

## 2018-04-30 HISTORY — DX: Elevated prostate specific antigen (PSA): R97.20

## 2018-04-30 HISTORY — PX: COLONOSCOPY WITH PROPOFOL: SHX5780

## 2018-04-30 HISTORY — DX: Other obstructive and reflux uropathy: N13.8

## 2018-04-30 HISTORY — DX: Unspecified hearing loss, unspecified ear: H91.90

## 2018-04-30 SURGERY — COLONOSCOPY WITH PROPOFOL
Anesthesia: General

## 2018-04-30 MED ORDER — LIDOCAINE HCL (CARDIAC) PF 100 MG/5ML IV SOSY
PREFILLED_SYRINGE | INTRAVENOUS | Status: DC | PRN
  Administered 2018-04-30: 100 mg via INTRAVENOUS

## 2018-04-30 MED ORDER — PROPOFOL 500 MG/50ML IV EMUL
INTRAVENOUS | Status: DC | PRN
  Administered 2018-04-30: 120 ug/kg/min via INTRAVENOUS

## 2018-04-30 MED ORDER — MIDAZOLAM HCL 2 MG/2ML IJ SOLN
INTRAMUSCULAR | Status: AC
  Filled 2018-04-30: qty 2

## 2018-04-30 MED ORDER — PHENYLEPHRINE HCL 10 MG/ML IJ SOLN
INTRAMUSCULAR | Status: DC | PRN
  Administered 2018-04-30 (×3): 100 ug via INTRAVENOUS

## 2018-04-30 MED ORDER — LIDOCAINE HCL (PF) 1 % IJ SOLN
INTRAMUSCULAR | Status: AC
  Administered 2018-04-30: 0.3 mL via INTRADERMAL
  Filled 2018-04-30: qty 2

## 2018-04-30 MED ORDER — LIDOCAINE HCL (PF) 1 % IJ SOLN
2.0000 mL | Freq: Once | INTRAMUSCULAR | Status: AC
  Administered 2018-04-30: 0.3 mL via INTRADERMAL

## 2018-04-30 MED ORDER — PROPOFOL 10 MG/ML IV BOLUS
INTRAVENOUS | Status: DC | PRN
  Administered 2018-04-30: 50 mg via INTRAVENOUS
  Administered 2018-04-30: 30 mg via INTRAVENOUS

## 2018-04-30 MED ORDER — SODIUM CHLORIDE 0.9 % IV SOLN
INTRAVENOUS | Status: DC
  Administered 2018-04-30: 1000 mL via INTRAVENOUS
  Administered 2018-04-30: 12:00:00 via INTRAVENOUS

## 2018-04-30 MED ORDER — MIDAZOLAM HCL 2 MG/2ML IJ SOLN
INTRAMUSCULAR | Status: DC | PRN
  Administered 2018-04-30: 2 mg via INTRAVENOUS

## 2018-04-30 NOTE — Anesthesia Post-op Follow-up Note (Signed)
Anesthesia QCDR form completed.        

## 2018-04-30 NOTE — Anesthesia Preprocedure Evaluation (Signed)
Anesthesia Evaluation  Patient identified by MRN, date of birth, ID band Patient awake    Reviewed: Allergy & Precautions, NPO status , Patient's Chart, lab work & pertinent test results  History of Anesthesia Complications Negative for: history of anesthetic complications  Airway Mallampati: II  TM Distance: >3 FB Neck ROM: Full    Dental no notable dental hx.    Pulmonary neg pulmonary ROS, neg sleep apnea, neg COPD,    breath sounds clear to auscultation- rhonchi (-) wheezing      Cardiovascular Exercise Tolerance: Good hypertension, Pt. on medications (-) CAD, (-) Past MI, (-) Cardiac Stents and (-) CABG  Rhythm:Regular Rate:Normal - Systolic murmurs and - Diastolic murmurs    Neuro/Psych  Headaches, neg Seizures negative psych ROS   GI/Hepatic Neg liver ROS, GERD  ,  Endo/Other  negative endocrine ROSneg diabetes  Renal/GU negative Renal ROS     Musculoskeletal  (+) Arthritis ,   Abdominal (+) - obese,   Peds  Hematology negative hematology ROS (+)   Anesthesia Other Findings Past Medical History: No date: Arthritis     Comment:  osteo No date: Back pain No date: Benign prostatic hyperplasia with urinary obstruction and  other lower urinary tract symptoms No date: Dyslipidemia No date: Elevated PSA No date: Enlarged prostate No date: Family history of breast cancer No date: Family history of colon cancer No date: Family history of lung cancer No date: Family history of pancreatic cancer No date: Family history of prostate cancer No date: GERD (gastroesophageal reflux disease) No date: Headache     Comment:  migraine without aura No date: HOH (hard of hearing) No date: Hypercholesterolemia No date: Hypertension 01/19/2018: Personal history of colon cancer   Reproductive/Obstetrics                             Anesthesia Physical Anesthesia Plan  ASA: II  Anesthesia  Plan: General   Post-op Pain Management:    Induction: Intravenous  PONV Risk Score and Plan: 1 and Propofol infusion  Airway Management Planned: Natural Airway  Additional Equipment:   Intra-op Plan:   Post-operative Plan:   Informed Consent: I have reviewed the patients History and Physical, chart, labs and discussed the procedure including the risks, benefits and alternatives for the proposed anesthesia with the patient or authorized representative who has indicated his/her understanding and acceptance.     Dental advisory given  Plan Discussed with: CRNA and Anesthesiologist  Anesthesia Plan Comments:         Anesthesia Quick Evaluation

## 2018-04-30 NOTE — Anesthesia Postprocedure Evaluation (Signed)
Anesthesia Post Note  Patient: Timothy Nicholson  Procedure(s) Performed: COLONOSCOPY WITH PROPOFOL (N/A )  Patient location during evaluation: Endoscopy Anesthesia Type: General Level of consciousness: awake and alert and oriented Pain management: pain level controlled Vital Signs Assessment: post-procedure vital signs reviewed and stable Respiratory status: spontaneous breathing, nonlabored ventilation and respiratory function stable Cardiovascular status: blood pressure returned to baseline and stable Postop Assessment: no signs of nausea or vomiting Anesthetic complications: no     Last Vitals:  Vitals:   04/30/18 1306 04/30/18 1317  BP: 112/69 118/74  Pulse: 83 86  Resp: 17 18  Temp: (!) 36.2 C   SpO2: 100% 100%    Last Pain:  Vitals:   04/30/18 1317  TempSrc:   PainSc: 0-No pain                 [name]

## 2018-04-30 NOTE — H&P (Signed)
Outpatient short stay form Pre-procedure 04/30/2018 11:43 AM Timothy Sails MD  Primary Physician: Frazier Richards, MD  Reason for visit: Colonoscopy  History of present illness: Patient is a 68 year old male presenting today for colonoscopy in regards to his personal history of adenomatous colon polyps.  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  The polyp was pedunculated and had clear margins.  There were no other recommendations for further treatment.  Presented today for recheck on the site.  He came in yesterday we found the prep to be an adequate and reprepped him for today.  He takes no aspirin or blood thinning agent.    Current Facility-Administered Medications:  .  lidocaine (PF) (XYLOCAINE) 1 % injection, , , ,  .  0.9 %  sodium chloride infusion, , Intravenous, Continuous, ,  U, MD .  lidocaine (PF) (XYLOCAINE) 1 % injection 2 mL, 2 mL, Intradermal, Once, Timothy Sails, MD  Medications Prior to Admission  Medication Sig Dispense Refill Last Dose  . ciprofloxacin (CIPRO) 500 MG tablet Take 1 tablet (500 mg total) by mouth 2 (two) times daily. (Patient not taking: Reported on 11/19/2017) 14 tablet 0 Not Taking at Unknown time  . cyclobenzaprine (FLEXERIL) 10 MG tablet Take 10 mg by mouth 3 (three) times daily as needed for muscle spasms.   04/29/2018 at 0130am  . esomeprazole (NEXIUM) 20 MG packet Take 20 mg by mouth daily before breakfast.   Not Taking at Unknown time  . finasteride (PROSCAR) 5 MG tablet Take 5 mg by mouth daily.   04/29/2018 at Unknown time  . LISINOPRIL PO Take by mouth.   04/29/2018 at Unknown time  . lisinopril-hydrochlorothiazide (PRINZIDE,ZESTORETIC) 10-12.5 MG tablet Take 1 tablet by mouth daily.   04/30/2018 at 0600  . pantoprazole (PROTONIX) 40 MG tablet Take 40 mg by mouth daily.     . tamsulosin (FLOMAX) 0.4 MG CAPS capsule Take  0.4 mg by mouth.   11/18/2017 at Unknown time     Allergies  Allergen Reactions  . Sulfa Antibiotics      Past Medical History:  Diagnosis Date  . Arthritis    osteo  . Back pain   . Benign prostatic hyperplasia with urinary obstruction and other lower urinary tract symptoms   . Dyslipidemia   . Elevated PSA   . Enlarged prostate   . Family history of breast cancer   . Family history of colon cancer   . Family history of lung cancer   . Family history of pancreatic cancer   . Family history of prostate cancer   . GERD (gastroesophageal reflux disease)   . Headache    migraine without aura  . HOH (hard of hearing)   . Hypercholesterolemia   . Hypertension   . Personal history of colon cancer 01/19/2018    Review of systems:      Physical Exam    Heart and lungs: The rate and rhythm without rub or gallop, lungs are bilaterally clear.    HEENT: Normocephalic atraumatic eyes are anicteric    Other:    Pertinant exam for procedure: Soft nontender nondistended bowel sounds positive normoactive    Planned proceedures: Colonoscopy and indicated procedures. I have discussed the risks benefits and complications of procedures to include not limited to bleeding, infection, perforation and the risk of sedation and the patient wishes to proceed.    Hassell Done  Kassie Mends, MD Gastroenterology 04/30/2018  11:43 AM

## 2018-04-30 NOTE — Op Note (Signed)
[facility] Patient Name: Timothy Nicholson Procedure Date: 04/30/2018 11:48 AM MRN: [id number] Account #: [id number] Date of Birth: 01-May-1950 Admit Type: Outpatient Age: 68 Room: [facility] ENDO ROOM 1 Gender: Male Note Status: Finalized Procedure:            Colonoscopy Indications:          High risk colon cancer surveillance: Personal history                        of colonic polyps, High risk colon cancer surveillance:                        Personal history of colon cancer in a colon polyp Providers:            Lollie Sails, MD Referring MD:         Ocie Cornfield. Ouida Sills MD, MD (Referring MD) Medicines:            Monitored Anesthesia Care Complications:        No immediate complications. Procedure:            Pre-Anesthesia Assessment:                       - ASA Grade Assessment: III - A patient with severe                        systemic disease.                       After obtaining informed consent, the colonoscope was                        passed under direct vision. Throughout the procedure,                        the patient's blood pressure, pulse, and oxygen                        saturations were monitored continuously. The was                        introduced through the anus and advanced to the the                        cecum, identified by appendiceal orifice and ileocecal                        valve. The colonoscopy was performed without                        difficulty. The patient tolerated the procedure well.                        The quality of the bowel preparation was good. Findings:      The digital rectal exam was normal.      A 2 mm polyp was found in the distal descending colon. The polyp was       sessile. The polyp was removed with a cold biopsy forceps. Resection and       retrieval were complete. Biopsies were taken with a cold forceps for  histology.      A 2 mm polyp was found in the transverse colon. The  polyp was sessile.       The polyp was removed with a cold snare. Resection and retrieval were       complete.      A 4 mm polyp was found in the hepatic flexure. The polyp was sessile.       The polyp was removed with a cold snare. Resection and retrieval were       complete.      A localized area of granular mucosa/possible polyp was found at 28 cm       proximal to the anus. Biopsies were taken/removed with a cold forceps       for histology.      A 2 mm polyp/granular mucosa was found in the distal sigmoid colon. The       polyp was sessile. The polyp was removed with a cold biopsy forceps.       Resection and retrieval were complete.      The retroflexed view of the distal rectum and anal verge was normal and       showed no anal or rectal abnormalities.      The exam was otherwise without abnormality.      The digital rectal exam was normal. Impression:           - One 2 mm polyp in the distal descending colon,                        removed with a cold biopsy forceps. Resected and                        retrieved. Biopsied.                       - One 2 mm polyp in the transverse colon, removed with                        a cold snare. Resected and retrieved.                       - One 4 mm polyp at the hepatic flexure, removed with a                        cold snare. Resected and retrieved.                       - Granularity at 28 cm proximal to the anus. Biopsied.                       - One 2 mm polyp in the distal sigmoid colon, removed                        with a cold biopsy forceps. Resected and retrieved.                       - The distal rectum and anal verge are normal on                        retroflexion view.                       -  The examination was otherwise normal. Recommendation:       - Discharge patient to home.                       - Await pathology results.                       - Soft diet today, then advance as tolerated to advance                         diet as tolerated. Procedure Code(s):    --- Professional ---                       [phone], Colonoscopy, flexible; with removal of tumor(s),                        polyp(s), or other lesion(s) by snare technique                       45380, 74, Colonoscopy, flexible; with biopsy, single                        or multiple Diagnosis Code(s):    --- Professional ---                       Z86.010, Personal history of colonic polyps                       Z85.038, Personal history of other malignant neoplasm                        of large intestine                       D12.4, Benign neoplasm of descending colon                       D12.3, Benign neoplasm of transverse colon (hepatic                        flexure or splenic flexure)                       D12.5, Benign neoplasm of sigmoid colon                       K63.89, Other specified diseases of intestine CPT copyright 2018 American Medical Association. All rights reserved. The codes documented in this report are preliminary and upon coder review [place]  be revised to meet current compliance requirements. Lollie Sails, MD 04/30/2018 1:07:02 PM This report has been signed electronically. Number of Addenda: 0 Note Initiated On: 04/30/2018 11:48 AM Scope Withdrawal Time: 0 hours 25 minutes 45 seconds  Total Procedure Duration: 0 hours 45 minutes 49 seconds       [facility]

## 2018-04-30 NOTE — Transfer of Care (Signed)
Immediate Anesthesia Transfer of Care Note  Patient: Timothy Nicholson  Procedure(s) Performed: COLONOSCOPY WITH PROPOFOL (N/A )  Patient Location: PACU and Endoscopy Unit  Anesthesia Type:General  Level of Consciousness: awake, oriented and patient cooperative  Airway & Oxygen Therapy: Patient Spontanous Breathing  Post-op Assessment: Report given to RN, Post -op Vital signs reviewed and stable and Patient moving all extremities  Post vital signs: Reviewed and stable  Last Vitals:  Vitals Value Taken Time  BP 142/111 04/30/2018  1:05 PM  Temp    Pulse 83 04/30/2018  1:06 PM  Resp 17 04/30/2018  1:06 PM  SpO2 100 % 04/30/2018  1:06 PM  Vitals shown include unvalidated device data.  Last Pain:  Vitals:   04/30/18 1128  TempSrc: Tympanic  PainSc: 3          Complications: No apparent anesthesia complications

## 2018-05-03 LAB — SURGICAL PATHOLOGY

## 2018-05-28 ENCOUNTER — Encounter: Payer: Self-pay | Admitting: *Deleted

## 2018-05-31 ENCOUNTER — Ambulatory Visit: Payer: Medicare Other | Admitting: Certified Registered"

## 2018-05-31 ENCOUNTER — Ambulatory Visit
Admission: RE | Admit: 2018-05-31 | Discharge: 2018-05-31 | Disposition: A | Discharge status: Home / Self Care | Payer: Medicare Other | Attending: Gastroenterology | Admitting: Gastroenterology

## 2018-05-31 ENCOUNTER — Other Ambulatory Visit: Payer: Self-pay

## 2018-05-31 ENCOUNTER — Encounter
Admission: RE | Disposition: A | Discharge status: Home / Self Care | Payer: Self-pay | Source: Home / Self Care | Attending: Gastroenterology

## 2018-05-31 ENCOUNTER — Encounter: Payer: Self-pay | Admitting: Student

## 2018-05-31 DIAGNOSIS — Z85038 Personal history of other malignant neoplasm of large intestine: Secondary | ICD-10-CM | POA: Insufficient documentation

## 2018-05-31 DIAGNOSIS — N401 Enlarged prostate with lower urinary tract symptoms: Secondary | ICD-10-CM | POA: Insufficient documentation

## 2018-05-31 DIAGNOSIS — Z08 Encounter for follow-up examination after completed treatment for malignant neoplasm: Secondary | ICD-10-CM | POA: Diagnosis present

## 2018-05-31 DIAGNOSIS — D123 Benign neoplasm of transverse colon: Secondary | ICD-10-CM | POA: Diagnosis not present

## 2018-05-31 DIAGNOSIS — Z86010 Personal history of colonic polyps: Secondary | ICD-10-CM | POA: Diagnosis not present

## 2018-05-31 DIAGNOSIS — H9190 Unspecified hearing loss, unspecified ear: Secondary | ICD-10-CM | POA: Diagnosis not present

## 2018-05-31 DIAGNOSIS — Z8249 Family history of ischemic heart disease and other diseases of the circulatory system: Secondary | ICD-10-CM | POA: Insufficient documentation

## 2018-05-31 DIAGNOSIS — K219 Gastro-esophageal reflux disease without esophagitis: Secondary | ICD-10-CM | POA: Insufficient documentation

## 2018-05-31 DIAGNOSIS — Z7951 Long term (current) use of inhaled steroids: Secondary | ICD-10-CM | POA: Insufficient documentation

## 2018-05-31 DIAGNOSIS — K640 First degree hemorrhoids: Secondary | ICD-10-CM | POA: Diagnosis not present

## 2018-05-31 DIAGNOSIS — N138 Other obstructive and reflux uropathy: Secondary | ICD-10-CM | POA: Insufficient documentation

## 2018-05-31 DIAGNOSIS — Z79899 Other long term (current) drug therapy: Secondary | ICD-10-CM | POA: Diagnosis not present

## 2018-05-31 HISTORY — DX: Malignant (primary) neoplasm, unspecified: C80.1

## 2018-05-31 HISTORY — PX: COLONOSCOPY WITH PROPOFOL: SHX5780

## 2018-05-31 SURGERY — COLONOSCOPY WITH PROPOFOL
Anesthesia: General

## 2018-05-31 MED ORDER — PROPOFOL 500 MG/50ML IV EMUL
INTRAVENOUS | Status: AC
  Filled 2018-05-31: qty 50

## 2018-05-31 MED ORDER — PROPOFOL 500 MG/50ML IV EMUL
INTRAVENOUS | Status: DC | PRN
  Administered 2018-05-31: 100 ug/kg/min via INTRAVENOUS

## 2018-05-31 MED ORDER — EPHEDRINE SULFATE 50 MG/ML IJ SOLN
INTRAMUSCULAR | Status: AC
  Filled 2018-05-31: qty 1

## 2018-05-31 MED ORDER — PROPOFOL 10 MG/ML IV BOLUS
INTRAVENOUS | Status: AC
  Filled 2018-05-31: qty 20

## 2018-05-31 MED ORDER — SPOT INK MARKER SYRINGE KIT
PACK | SUBMUCOSAL | Status: DC | PRN
  Administered 2018-05-31: 2 mL via SUBMUCOSAL

## 2018-05-31 MED ORDER — FENTANYL CITRATE (PF) 100 MCG/2ML IJ SOLN
INTRAMUSCULAR | Status: AC
  Filled 2018-05-31: qty 2

## 2018-05-31 MED ORDER — MIDAZOLAM HCL 2 MG/2ML IJ SOLN
INTRAMUSCULAR | Status: DC | PRN
  Administered 2018-05-31 (×2): 1 mg via INTRAVENOUS

## 2018-05-31 MED ORDER — LIDOCAINE HCL (CARDIAC) PF 100 MG/5ML IV SOSY
PREFILLED_SYRINGE | INTRAVENOUS | Status: DC | PRN
  Administered 2018-05-31: 60 mg via INTRATRACHEAL

## 2018-05-31 MED ORDER — EPHEDRINE SULFATE 50 MG/ML IJ SOLN
INTRAMUSCULAR | Status: DC | PRN
  Administered 2018-05-31: 10 mg via INTRAVENOUS

## 2018-05-31 MED ORDER — SODIUM CHLORIDE 0.9 % IV SOLN
INTRAVENOUS | Status: DC
  Administered 2018-05-31: 1000 mL via INTRAVENOUS
  Administered 2018-05-31: 12:00:00 via INTRAVENOUS

## 2018-05-31 MED ORDER — MIDAZOLAM HCL 2 MG/2ML IJ SOLN
INTRAMUSCULAR | Status: AC
  Filled 2018-05-31: qty 2

## 2018-05-31 MED ORDER — PHENYLEPHRINE HCL 10 MG/ML IJ SOLN
INTRAMUSCULAR | Status: DC | PRN
  Administered 2018-05-31: 100 ug via INTRAVENOUS
  Administered 2018-05-31 (×2): 200 ug via INTRAVENOUS
  Administered 2018-05-31: 100 ug via INTRAVENOUS
  Administered 2018-05-31 (×4): 200 ug via INTRAVENOUS
  Administered 2018-05-31: 150 ug via INTRAVENOUS
  Administered 2018-05-31 (×3): 200 ug via INTRAVENOUS
  Administered 2018-05-31: 100 ug via INTRAVENOUS
  Administered 2018-05-31: 150 ug via INTRAVENOUS

## 2018-05-31 MED ORDER — LIDOCAINE HCL (PF) 1 % IJ SOLN
INTRAMUSCULAR | Status: AC
  Administered 2018-05-31: 0.3 mL
  Filled 2018-05-31: qty 2

## 2018-05-31 MED ORDER — PROPOFOL 10 MG/ML IV BOLUS
INTRAVENOUS | Status: DC | PRN
  Administered 2018-05-31: 20 mg via INTRAVENOUS
  Administered 2018-05-31: 50 mg via INTRAVENOUS
  Administered 2018-05-31: 30 mg via INTRAVENOUS

## 2018-05-31 MED ORDER — LIDOCAINE HCL (PF) 2 % IJ SOLN
INTRAMUSCULAR | Status: AC
  Filled 2018-05-31: qty 10

## 2018-05-31 MED ORDER — FENTANYL CITRATE (PF) 100 MCG/2ML IJ SOLN
INTRAMUSCULAR | Status: DC | PRN
  Administered 2018-05-31 (×2): 25 ug via INTRAVENOUS

## 2018-05-31 NOTE — Anesthesia Preprocedure Evaluation (Signed)
Anesthesia Evaluation  Patient identified by MRN, date of birth, ID band Patient awake    Reviewed: Allergy & Precautions, H&P , NPO status , Patient's Chart, lab work & pertinent test results  History of Anesthesia Complications Negative for: history of anesthetic complications  Airway Mallampati: III  TM Distance: <3 FB Neck ROM: limited    Dental  (+) Chipped   Pulmonary neg pulmonary ROS, neg shortness of breath,           Cardiovascular Exercise Tolerance: Good hypertension, (-) angina(-) Past MI and (-) DOE      Neuro/Psych  Headaches, negative psych ROS   GI/Hepatic Neg liver ROS, GERD  Medicated and Controlled,  Endo/Other  negative endocrine ROS  Renal/GU negative Renal ROS  negative genitourinary   Musculoskeletal  (+) Arthritis ,   Abdominal   Peds  Hematology negative hematology ROS (+)   Anesthesia Other Findings Past Medical History: No date: Arthritis     Comment:  osteo No date: Back pain No date: Benign prostatic hyperplasia with urinary obstruction and  other lower urinary tract symptoms No date: Cancer (HCC) No date: Dyslipidemia No date: Elevated PSA No date: Enlarged prostate No date: Family history of breast cancer No date: Family history of colon cancer No date: Family history of lung cancer No date: Family history of pancreatic cancer No date: Family history of prostate cancer No date: GERD (gastroesophageal reflux disease) No date: Headache     Comment:  migraine without aura No date: HOH (hard of hearing) No date: Hypercholesterolemia No date: Hypertension 01/19/2018: Personal history of colon cancer  Past Surgical History: 08/27/2010: COLONOSCOPY     Comment:  Dr. Gustavo Lah Tubular adenomas, FH polyps No date: COLONOSCOPY 11/19/2017: COLONOSCOPY WITH PROPOFOL; N/A     Comment:  Procedure: COLONOSCOPY WITH PROPOFOL;  Surgeon:               Lollie Sails, MD;  Location:  [facility] ENDOSCOPY;                Service: Endoscopy;  Laterality: N/A; 04/29/2018: COLONOSCOPY WITH PROPOFOL; N/A     Comment:  Procedure: COLONOSCOPY WITH PROPOFOL;  Surgeon:               Lollie Sails, MD;  Location: [facility] ENDOSCOPY;                Service: Endoscopy;  Laterality: N/A; 04/30/2018: COLONOSCOPY WITH PROPOFOL; N/A     Comment:  Procedure: COLONOSCOPY WITH PROPOFOL;  Surgeon:               Lollie Sails, MD;  Location: [facility] ENDOSCOPY;                Service: Endoscopy;  Laterality: N/A; 11/19/2017: ESOPHAGOGASTRODUODENOSCOPY (EGD) WITH PROPOFOL; N/A     Comment:  Procedure: ESOPHAGOGASTRODUODENOSCOPY (EGD) WITH               PROPOFOL;  Surgeon: Lollie Sails, MD;  Location:               [facility] ENDOSCOPY;  Service: Endoscopy;  Laterality: N/A; No date: TONSILLECTOMY  BMI    Body Mass Index:  26.58 kg/m      Reproductive/Obstetrics negative OB ROS                             Anesthesia Physical Anesthesia Plan  ASA: III  Anesthesia Plan: General   Post-op Pain  Management:    Induction: Intravenous  PONV Risk Score and Plan: Propofol infusion and TIVA  Airway Management Planned: Natural Airway and Nasal Cannula  Additional Equipment:   Intra-op Plan:   Post-operative Plan:   Informed Consent: I have reviewed the patients History and Physical, chart, labs and discussed the procedure including the risks, benefits and alternatives for the proposed anesthesia with the patient or authorized representative who has indicated his/her understanding and acceptance.     Dental Advisory Given  Plan Discussed with: Anesthesiologist, CRNA and Surgeon  Anesthesia Plan Comments: (Patient consented for risks of anesthesia including but not limited to:  - adverse reactions to medications - risk of intubation if required - damage to teeth, lips or other oral mucosa - sore throat or hoarseness - Damage to heart, brain, lungs or loss of  life  Patient voiced understanding.)        Anesthesia Quick Evaluation

## 2018-05-31 NOTE — Transfer of Care (Signed)
Immediate Anesthesia Transfer of Care Note  Patient: Timothy Nicholson Byas  Procedure(s) Performed: COLONOSCOPY WITH PROPOFOL (N/A )  Patient Location: Endoscopy Unit  Anesthesia Type:General  Level of Consciousness: awake and drowsy  Airway & Oxygen Therapy: Patient Spontanous Breathing and Patient connected to nasal cannula oxygen  Post-op Assessment: Report given to RN and Post -op Vital signs reviewed and stable  Post vital signs: stable  Last Vitals:  Vitals Value Taken Time  BP 95/57 05/31/2018 12:10 PM  Temp 36.3 C 05/31/2018 12:07 PM  Pulse 89 05/31/2018 12:11 PM  Resp 23 05/31/2018 12:11 PM  SpO2 100 % 05/31/2018 12:11 PM  Vitals shown include unvalidated device data.  Last Pain:  Vitals:   05/31/18 1207  TempSrc: Tympanic  PainSc: 0-No pain         Complications: No apparent anesthesia complications

## 2018-05-31 NOTE — H&P (Signed)
Outpatient short stay form Pre-procedure 05/31/2018 11:00 AM Lollie Sails MD  Primary Physician: Frazier Richards  Reason for visit: Colonoscopy  History of present illness: Patient is a 68 year old male presenting today for a colonoscopy.  He has a personal history of a is of adenocarcinoma and a polyp that was removed completely on colonoscopy 11/19/2017.  However on 04/29/2018 I took him back to colonoscopy for recheck of the original site and several smaller polypoid lesions were removed at that time.  1 of these was identified as a "at least intramucosal carcinoma, definite invasion of submucosa not identified".  Patient tolerated his prep well.  He takes no aspirin or blood thinning agent.  Before his procedure today we discussed plans today and future options depending on what we find.  Is also been discussed with 1 of the physicians at [place] in regards to these issues.    Current Facility-Administered Medications:  .  0.9 %  sodium chloride infusion, , Intravenous, Continuous, Lollie Sails, MD, Last Rate: 20 mL/hr at 05/31/18 1037  Medications Prior to Admission  Medication Sig Dispense Refill Last Dose  . finasteride (PROSCAR) 5 MG tablet Take 5 mg by mouth daily.   05/30/2018 at 0800  . fluticasone (FLONASE) 50 MCG/ACT nasal spray Place into both nostrils daily.     Marland Kitchen lisinopril-hydrochlorothiazide (PRINZIDE,ZESTORETIC) 10-12.5 MG tablet Take 1 tablet by mouth daily.   05/31/2018 at 0600  . ondansetron (ZOFRAN) 4 MG tablet Take 4 mg by mouth every 8 (eight) hours as needed for nausea or vomiting.   05/30/2018 at Unknown time  . pantoprazole (PROTONIX) 40 MG tablet Take 40 mg by mouth daily.   05/30/2018 at 0800  . tamsulosin (FLOMAX) 0.4 MG CAPS capsule Take 0.4 mg by mouth.   05/30/2018 at 0800  . ciprofloxacin (CIPRO) 500 MG tablet Take 1 tablet (500 mg total) by mouth 2 (two) times daily. (Patient not taking: Reported on 11/19/2017) 14 tablet 0 Completed Course at Unknown  time  . cyclobenzaprine (FLEXERIL) 10 MG tablet Take 10 mg by mouth 3 (three) times daily as needed for muscle spasms.   Not Taking at Unknown time  . esomeprazole (NEXIUM) 20 MG packet Take 20 mg by mouth daily before breakfast.   Not Taking at Unknown time  . LISINOPRIL PO Take by mouth.   Not Taking     Allergies  Allergen Reactions  . Sulfa Antibiotics      Past Medical History:  Diagnosis Date  . Arthritis    osteo  . Back pain   . Benign prostatic hyperplasia with urinary obstruction and other lower urinary tract symptoms   . Cancer ([place])   . Dyslipidemia   . Elevated PSA   . Enlarged prostate   . Family history of breast cancer   . Family history of colon cancer   . Family history of lung cancer   . Family history of pancreatic cancer   . Family history of prostate cancer   . GERD (gastroesophageal reflux disease)   . Headache    migraine without aura  . HOH (hard of hearing)   . Hypercholesterolemia   . Hypertension   . Personal history of colon cancer 01/19/2018    Review of systems:      Physical Exam    Heart and lungs: Rhythm without rub or gallop, lungs are bilaterally clear.    HEENT: Normocephalic atraumatic eyes are anicteric    Other:    Pertinant exam for procedure:  Soft nontender nondistended bowel sounds positive normoactive.    Planned proceedures: Colonoscopy and indicated procedures. I have discussed the risks benefits and complications of procedures to include not limited to bleeding, infection, perforation and the risk of sedation and the patient wishes to proceed.    Lollie Sails, MD Gastroenterology 05/31/2018  11:00 AM

## 2018-05-31 NOTE — Anesthesia Post-op Follow-up Note (Signed)
Anesthesia QCDR form completed.        

## 2018-05-31 NOTE — Op Note (Addendum)
[facility] Patient Name: Timothy Nicholson Procedure Date: 05/31/2018 10:41 AM MRN: [id number] Account #: [id number] Date of Birth: [birth year] Admit Type: Outpatient Age: 68 Room: [facility] ENDO ROOM 1 Gender: Male Note Status: Finalized Procedure:            Colonoscopy Indications:          Follow-up of colon cancer, Personal history of                        malignant neoplasm of the colon, Personal history of                        colonic polyps Providers:            Lollie Sails, MD Medicines:            Monitored Anesthesia Care Complications:        No immediate complications. Procedure:            Pre-Anesthesia Assessment:                       - ASA Grade Assessment: III - A patient with severe                        systemic disease.                       After obtaining informed consent, the colonoscope was                        passed under direct vision. Throughout the procedure,                        the patient's blood pressure, pulse, and oxygen                        saturations were monitored continuously. The                        Colonoscope was introduced through the anus and                        advanced to the the cecum, identified by appendiceal                        orifice and ileocecal valve. The colonoscopy was                        performed without difficulty. The patient tolerated the                        procedure well. The quality of the bowel preparation                        was good. Exam was conducted in both white light and                        NBI. Findings:      A 2 mm polyp was found in the splenic flexure. The polyp was sessile.       The polyp was removed with a cold biopsy forceps. Resection and  retrieval were complete.      A 4 to 5 mm polyp was found in the mid transverse colon. The polyp was       sessile, atypical in appearance. Biopsies were taken with a cold forceps       for  histology. Area was tattooed with an injection of 1-1.5 mL of [place]       ink about 2 mc proximal to the biopsy site on the next fold proximally.      Non-bleeding internal hemorrhoids were found during retroflexion. The       hemorrhoids were small and Grade I (internal hemorrhoids that do not       prolapse).      No additional abnormalities were found on retroflexion.      The exam was otherwise without abnormality.      The digital rectal exam was normal. Impression:           - One 2 mm polyp at the splenic flexure, removed with a                        cold biopsy forceps. Resected and retrieved.                       - One 4 mm atypical polyp in the mid transverse colon.                        Biopsied. Tattooed.                       - Non-bleeding internal hemorrhoids.                       - The examination was otherwise normal. Recommendation:       - Discharge patient to home.                       - Await pathology results. Procedure Code(s):    --- Professional ---                       [phone], Colonoscopy, flexible; with biopsy, single or                        multiple                       45381, Colonoscopy, flexible; with directed submucosal                        injection(s), any substance Diagnosis Code(s):    --- Professional ---                       D12.3, Benign neoplasm of transverse colon (hepatic                        flexure or splenic flexure)                       K64.0, First degree hemorrhoids                       C18.9, Malignant neoplasm of colon, unspecified  Z85.038, Personal history of other malignant neoplasm                        of large intestine                       Z86.010, Personal history of colonic polyps CPT copyright 2018 American Medical Association. All rights reserved. The codes documented in this report are preliminary and upon coder review [place]  be revised to meet current compliance requirements. Lollie Sails, MD 05/31/2018 12:12:12 PM This report has been signed electronically. Number of Addenda: 0 Note Initiated On: 05/31/2018 10:41 AM Scope Withdrawal Time: 0 hours 31 minutes 5 seconds  Total Procedure Duration: 0 hours 50 minutes 51 seconds       [facility]

## 2018-06-01 ENCOUNTER — Encounter: Payer: Self-pay | Admitting: Gastroenterology

## 2018-06-01 LAB — SURGICAL PATHOLOGY

## 2018-06-01 NOTE — Anesthesia Postprocedure Evaluation (Signed)
Anesthesia Post Note  Patient: Jeremaih Klima Orndoff  Procedure(s) Performed: COLONOSCOPY WITH PROPOFOL (N/A )  Patient location during evaluation: Endoscopy Anesthesia Type: General Level of consciousness: awake and alert Pain management: pain level controlled Vital Signs Assessment: post-procedure vital signs reviewed and stable Respiratory status: spontaneous breathing, nonlabored ventilation, respiratory function stable and patient connected to nasal cannula oxygen Cardiovascular status: blood pressure returned to baseline and stable Postop Assessment: no apparent nausea or vomiting Anesthetic complications: no     Last Vitals:  Vitals:   05/31/18 1227 05/31/18 1237  BP: (!) 102/51 120/76  Pulse: 80 80  Resp: 20 16  Temp:    SpO2: 100% 99%    Last Pain:  Vitals:   06/01/18 0714  TempSrc:   PainSc: 0-No pain                 Precious Haws Kaleeya Hancock

## 2018-09-09 ENCOUNTER — Inpatient Hospital Stay: Admission: RE | Admit: 2018-09-09 | Payer: Medicare Other | Source: Ambulatory Visit

## 2018-09-13 ENCOUNTER — Ambulatory Visit: Admission: RE | Admit: 2018-09-13 | Payer: Medicare Other | Source: Home / Self Care | Admitting: Gastroenterology

## 2018-09-13 ENCOUNTER — Other Ambulatory Visit: Payer: Self-pay

## 2018-09-13 ENCOUNTER — Encounter: Admission: RE | Payer: Self-pay | Source: Home / Self Care

## 2018-09-13 ENCOUNTER — Other Ambulatory Visit
Admission: RE | Admit: 2018-09-13 | Discharge: 2018-09-13 | Disposition: A | Discharge status: Home / Self Care | Payer: Medicare Other | Source: Ambulatory Visit | Attending: Gastroenterology | Admitting: Gastroenterology

## 2018-09-13 DIAGNOSIS — Z1159 Encounter for screening for other viral diseases: Secondary | ICD-10-CM | POA: Insufficient documentation

## 2018-09-13 SURGERY — COLONOSCOPY WITH PROPOFOL
Anesthesia: General

## 2018-09-14 LAB — NOVEL CORONAVIRUS, NAA (HOSP ORDER, SEND-OUT TO REF LAB; TAT 18-24 HRS): SARS-CoV-2, NAA: NOT DETECTED

## 2018-09-17 ENCOUNTER — Ambulatory Visit
Admission: RE | Admit: 2018-09-17 | Discharge: 2018-09-17 | Disposition: A | Discharge status: Home / Self Care | Payer: Medicare Other | Attending: Gastroenterology | Admitting: Gastroenterology

## 2018-09-17 ENCOUNTER — Ambulatory Visit: Payer: Medicare Other | Admitting: Certified Registered Nurse Anesthetist

## 2018-09-17 ENCOUNTER — Encounter
Admission: RE | Disposition: A | Discharge status: Home / Self Care | Payer: Self-pay | Source: Home / Self Care | Attending: Gastroenterology

## 2018-09-17 ENCOUNTER — Encounter: Payer: Self-pay | Admitting: *Deleted

## 2018-09-17 DIAGNOSIS — K219 Gastro-esophageal reflux disease without esophagitis: Secondary | ICD-10-CM | POA: Diagnosis not present

## 2018-09-17 DIAGNOSIS — N138 Other obstructive and reflux uropathy: Secondary | ICD-10-CM | POA: Diagnosis not present

## 2018-09-17 DIAGNOSIS — N401 Enlarged prostate with lower urinary tract symptoms: Secondary | ICD-10-CM | POA: Insufficient documentation

## 2018-09-17 DIAGNOSIS — I10 Essential (primary) hypertension: Secondary | ICD-10-CM | POA: Diagnosis not present

## 2018-09-17 DIAGNOSIS — G43009 Migraine without aura, not intractable, without status migrainosus: Secondary | ICD-10-CM | POA: Diagnosis not present

## 2018-09-17 DIAGNOSIS — M1990 Unspecified osteoarthritis, unspecified site: Secondary | ICD-10-CM | POA: Insufficient documentation

## 2018-09-17 DIAGNOSIS — K640 First degree hemorrhoids: Secondary | ICD-10-CM | POA: Insufficient documentation

## 2018-09-17 DIAGNOSIS — K635 Polyp of colon: Secondary | ICD-10-CM | POA: Diagnosis not present

## 2018-09-17 DIAGNOSIS — Z08 Encounter for follow-up examination after completed treatment for malignant neoplasm: Secondary | ICD-10-CM | POA: Diagnosis not present

## 2018-09-17 DIAGNOSIS — K5730 Diverticulosis of large intestine without perforation or abscess without bleeding: Secondary | ICD-10-CM | POA: Insufficient documentation

## 2018-09-17 DIAGNOSIS — E7800 Pure hypercholesterolemia, unspecified: Secondary | ICD-10-CM | POA: Insufficient documentation

## 2018-09-17 DIAGNOSIS — K6389 Other specified diseases of intestine: Secondary | ICD-10-CM | POA: Diagnosis not present

## 2018-09-17 DIAGNOSIS — Z6825 Body mass index (BMI) 25.0-25.9, adult: Secondary | ICD-10-CM | POA: Insufficient documentation

## 2018-09-17 DIAGNOSIS — E669 Obesity, unspecified: Secondary | ICD-10-CM | POA: Insufficient documentation

## 2018-09-17 DIAGNOSIS — Z85038 Personal history of other malignant neoplasm of large intestine: Secondary | ICD-10-CM | POA: Diagnosis not present

## 2018-09-17 DIAGNOSIS — E785 Hyperlipidemia, unspecified: Secondary | ICD-10-CM | POA: Diagnosis not present

## 2018-09-17 HISTORY — PX: COLONOSCOPY WITH PROPOFOL: SHX5780

## 2018-09-17 SURGERY — COLONOSCOPY WITH PROPOFOL
Anesthesia: General

## 2018-09-17 MED ORDER — PROPOFOL 500 MG/50ML IV EMUL
INTRAVENOUS | Status: AC
  Filled 2018-09-17: qty 50

## 2018-09-17 MED ORDER — SODIUM CHLORIDE 0.9 % IV SOLN: INTRAVENOUS | Status: DC

## 2018-09-17 MED ORDER — PROPOFOL 500 MG/50ML IV EMUL
INTRAVENOUS | Status: DC | PRN
  Administered 2018-09-17: 110 ug/kg/min via INTRAVENOUS

## 2018-09-17 MED ORDER — PHENYLEPHRINE HCL (PRESSORS) 10 MG/ML IV SOLN
INTRAVENOUS | Status: DC | PRN
  Administered 2018-09-17 (×3): 100 ug via INTRAVENOUS

## 2018-09-17 MED ORDER — PROPOFOL 10 MG/ML IV BOLUS
INTRAVENOUS | Status: AC
  Filled 2018-09-17: qty 20

## 2018-09-17 MED ORDER — PROPOFOL 10 MG/ML IV BOLUS
INTRAVENOUS | Status: DC | PRN
  Administered 2018-09-17: 20 mg via INTRAVENOUS
  Administered 2018-09-17: 30 mg via INTRAVENOUS

## 2018-09-17 MED ORDER — SODIUM CHLORIDE 0.9 % IV SOLN
INTRAVENOUS | Status: DC | PRN
  Administered 2018-09-17: 14:00:00 via INTRAVENOUS

## 2018-09-17 MED ORDER — LIDOCAINE HCL (CARDIAC) PF 100 MG/5ML IV SOSY
PREFILLED_SYRINGE | INTRAVENOUS | Status: DC | PRN
  Administered 2018-09-17: 50 mg via INTRAVENOUS

## 2018-09-17 MED ORDER — SODIUM CHLORIDE 0.9 % IV SOLN
INTRAVENOUS | Status: DC
  Administered 2018-09-17: 1000 mL via INTRAVENOUS

## 2018-09-17 NOTE — Op Note (Signed)
[facility] Gastroenterology Patient Name: Denney Pruitt Procedure Date: 09/17/2018 1:45 PM MRN: [id number] Account #: [id number] Date of Birth: 1951/01/01 Admit Type: Outpatient Age: 68 Room: [facility] ENDO ROOM 4 Gender: Male Note Status: Finalized Procedure:            Colonoscopy Indications:          High risk colon cancer surveillance: Personal history                        of adenoma less than 10 mm in size, High risk colon                        cancer surveillance: Personal history of colon cancer,                        Personal history of colonic polyps Providers:            Lollie Sails, MD Referring MD:         Ocie Cornfield. Ouida Sills MD, MD (Referring MD) Medicines:            Monitored Anesthesia Care Complications:        No immediate complications. Procedure:            Pre-Anesthesia Assessment:                       - ASA Grade Assessment: II - A patient with mild                        systemic disease.                       After obtaining informed consent, the colonoscope was                        passed under direct vision. Throughout the procedure,                        the patient's blood pressure, pulse, and oxygen                        saturations were monitored continuously. The                        Colonoscope was introduced through the anus and                        advanced to the the cecum, identified by appendiceal                        orifice and ileocecal valve. The colonoscopy was                        performed without difficulty. The patient tolerated the                        procedure well. The quality of the bowel preparation                        was good. Findings:      A few small-mouthed diverticula were found in the sigmoid colon  and       distal descending colon.      Two sessile polyps were found at 20 cm proximal to the anus. The polyps       were diminutive in size. These polyps were removed with a cold  biopsy       forceps. Resection and retrieval were complete.      A 2-3 mm polyp was found in the transverse colon. The polyp was sessile       located at the distal margin of the previously placed tattoo. The polyp       was removed with a cold biopsy forceps. Resection and retrieval were       complete. A margin around the lesion also removed.      A 1 mm polyp was found at 42 cm proximal to the anus. The polyp was       sessile. The polyp was removed with a cold biopsy forceps. Resection and       retrieval were complete.      Non-bleeding internal hemorrhoids were found during retroflexion. The       hemorrhoids were small and Grade I (internal hemorrhoids that do not       prolapse).      No additional abnormalities were found on retroflexion.      The digital rectal exam was normal. Impression:           - Diverticulosis in the sigmoid colon and in the distal                        descending colon.                       - Two diminutive polyps at 20 cm proximal to the anus,                        removed with a cold biopsy forceps. Resected and                        retrieved.                       - One 2 mm polyp in the transverse colon, removed with                        a cold biopsy forceps. Resected and retrieved.                       - One 1 mm polyp at 42 cm proximal to the anus, removed                        with a cold biopsy forceps. Resected and retrieved.                       - Non-bleeding internal hemorrhoids. Recommendation:       - Discharge patient to home.                       - Await pathology results. Procedure Code(s):    --- Professional ---                       [id number], Colonoscopy, flexible; with biopsy, single or  multiple Diagnosis Code(s):    --- Professional ---                       K63.5, Polyp of colon                       Z86.010, Personal history of colonic polyps                       Z85.038, Personal history of  other malignant neoplasm                        of large intestine                       K64.0, First degree hemorrhoids                       K57.30, Diverticulosis of large intestine without                        perforation or abscess without bleeding CPT copyright 2019 American Medical Association. All rights reserved. The codes documented in this report are preliminary and upon coder review [facility]  be revised to meet current compliance requirements. Lollie Sails, MD 09/17/2018 2:39:00 PM This report has been signed electronically. Number of Addenda: 0 Note Initiated On: 09/17/2018 1:45 PM Scope Withdrawal Time: 0 hours 11 minutes 18 seconds  Total Procedure Duration: 0 hours 30 minutes 22 seconds       [facility]

## 2018-09-17 NOTE — Anesthesia Preprocedure Evaluation (Signed)
Anesthesia Evaluation  Patient identified by MRN, date of birth, ID band Patient awake    Reviewed: Allergy & Precautions, NPO status , Patient's Chart, lab work & pertinent test results  History of Anesthesia Complications Negative for: history of anesthetic complications  Airway Mallampati: II  TM Distance: >3 FB Neck ROM: Full    Dental no notable dental hx.    Pulmonary neg pulmonary ROS, neg sleep apnea, neg COPD,    breath sounds clear to auscultation- rhonchi (-) wheezing      Cardiovascular hypertension, Pt. on medications (-) CAD, (-) Past MI, (-) Cardiac Stents and (-) CABG  Rhythm:Regular Rate:Normal - Systolic murmurs and - Diastolic murmurs    Neuro/Psych  Headaches, negative psych ROS   GI/Hepatic Neg liver ROS, GERD  ,  Endo/Other  negative endocrine ROSneg diabetes  Renal/GU negative Renal ROS     Musculoskeletal  (+) Arthritis ,   Abdominal (+) - obese,   Peds  Hematology negative hematology ROS (+)   Anesthesia Other Findings Past Medical History: No date: Arthritis     Comment:  osteo No date: Back pain No date: Benign prostatic hyperplasia with urinary obstruction and  other lower urinary tract symptoms No date: Cancer (HCC) No date: Dyslipidemia No date: Elevated PSA No date: Enlarged prostate No date: Family history of breast cancer No date: Family history of colon cancer No date: Family history of lung cancer No date: Family history of pancreatic cancer No date: Family history of prostate cancer No date: GERD (gastroesophageal reflux disease) No date: Headache     Comment:  migraine without aura No date: HOH (hard of hearing) No date: Hypercholesterolemia No date: Hypertension 01/19/2018: Personal history of colon cancer   Reproductive/Obstetrics                             Anesthesia Physical Anesthesia Plan  ASA: II  Anesthesia Plan: General    Post-op Pain Management:    Induction: Intravenous  PONV Risk Score and Plan: 1 and Propofol infusion  Airway Management Planned: Natural Airway  Additional Equipment:   Intra-op Plan:   Post-operative Plan:   Informed Consent: I have reviewed the patients History and Physical, chart, labs and discussed the procedure including the risks, benefits and alternatives for the proposed anesthesia with the patient or authorized representative who has indicated his/her understanding and acceptance.     Dental advisory given  Plan Discussed with: CRNA and Anesthesiologist  Anesthesia Plan Comments:         Anesthesia Quick Evaluation

## 2018-09-17 NOTE — Transfer of Care (Signed)
Immediate Anesthesia Transfer of Care Note  Patient: Timothy Nicholson  Procedure(s) Performed: COLONOSCOPY WITH PROPOFOL (N/A )  Patient Location: PACU  Anesthesia Type:MAC  Level of Consciousness: awake, alert  and oriented  Airway & Oxygen Therapy: Patient Spontanous Breathing  Post-op Assessment: Report given to RN and Post -op Vital signs reviewed and stable  Post vital signs: Reviewed and stable  Last Vitals:  Vitals Value Taken Time  BP    Temp    Pulse    Resp    SpO2      Last Pain:  Vitals:   09/17/18 1310  TempSrc: Tympanic  PainSc: 0-No pain      Patients Stated Pain Goal: 0 (54/00/86 [id number])  Complications: No apparent anesthesia complications

## 2018-09-17 NOTE — H&P (Signed)
Outpatient short stay form Pre-procedure 09/17/2018 1:47 PM Lollie Sails MD  Primary Physician: Dr. Frazier Richards  Reason for visit: Colonoscopy  History of present illness: Patient is a 68 year old male presenting today for colonoscopy.  He has personal history of a cancerous colon polyp being removed about a year ago.  However on follow-up he was found to have a small lesion that showed also some evidence of cancer.  He was followed up with a biopsy subsequently with a finding only of adenoma.  Is returning today for recheck on the sites.  He tolerated his prep well.  He takes no aspirin or blood thinning agent.  He denies any problems with abdominal pain rectal bleeding changes in bowel habits or diarrhea.    Current Facility-Administered Medications:  .  0.9 %  sodium chloride infusion, , Intravenous, Continuous, Lollie Sails, MD, Last Rate: 20 mL/hr at 09/17/18 1343, 1,000 mL at 09/17/18 1343 .  0.9 %  sodium chloride infusion, , Intravenous, Continuous, Lollie Sails, MD  Medications Prior to Admission  Medication Sig Dispense Refill Last Dose  . finasteride (PROSCAR) 5 MG tablet Take 5 mg by mouth daily.   Past Week at Unknown time  . lisinopril-hydrochlorothiazide (PRINZIDE,ZESTORETIC) 10-12.5 MG tablet Take 1 tablet by mouth daily.   09/17/2018 at Unknown time  . pantoprazole (PROTONIX) 40 MG tablet Take 40 mg by mouth daily.   09/16/2018 at Unknown time  . tamsulosin (FLOMAX) 0.4 MG CAPS capsule Take 0.4 mg by mouth.   Past Week at Unknown time  . ciprofloxacin (CIPRO) 500 MG tablet Take 1 tablet (500 mg total) by mouth 2 (two) times daily. (Patient not taking: Reported on 11/19/2017) 14 tablet 0 Completed Course at Unknown time  . cyclobenzaprine (FLEXERIL) 10 MG tablet Take 10 mg by mouth 3 (three) times daily as needed for muscle spasms.   Completed Course at Unknown time  . esomeprazole (NEXIUM) 20 MG packet Take 20 mg by mouth daily before breakfast.   Completed  Course at Unknown time  . fluticasone (FLONASE) 50 MCG/ACT nasal spray Place into both nostrils daily.   Completed Course at Unknown time  . LISINOPRIL PO Take by mouth.   Not Taking at 630  . ondansetron (ZOFRAN) 4 MG tablet Take 4 mg by mouth every 8 (eight) hours as needed for nausea or vomiting.   Not Taking at Unknown time     Allergies  Allergen Reactions  . Sulfa Antibiotics      Past Medical History:  Diagnosis Date  . Arthritis    osteo  . Back pain   . Benign prostatic hyperplasia with urinary obstruction and other lower urinary tract symptoms   . Cancer ([place])   . Dyslipidemia   . Elevated PSA   . Enlarged prostate   . Family history of breast cancer   . Family history of colon cancer   . Family history of lung cancer   . Family history of pancreatic cancer   . Family history of prostate cancer   . GERD (gastroesophageal reflux disease)   . Headache    migraine without aura  . HOH (hard of hearing)   . Hypercholesterolemia   . Hypertension   . Personal history of colon cancer 01/19/2018    Review of systems:      Physical Exam    Heart and lungs: Rhythm without rub or gallop, lungs are bilaterally clear.    HEENT: Normocephalic atraumatic eyes are anicteric  Other:    Pertinant exam for procedure: Soft nontender nondistended bowel sounds positive normoactive.    Planned proceedures: Colonoscopy and indicated procedures. I have discussed the risks benefits and complications of procedures to include not limited to bleeding, infection, perforation and the risk of sedation and the patient wishes to proceed.    Lollie Sails, MD Gastroenterology 09/17/2018  1:47 PM

## 2018-09-17 NOTE — Anesthesia Post-op Follow-up Note (Signed)
Anesthesia QCDR form completed.        

## 2018-09-20 NOTE — Anesthesia Postprocedure Evaluation (Signed)
Anesthesia Post Note  Patient: Timothy Nicholson  Procedure(s) Performed: COLONOSCOPY WITH PROPOFOL (N/A )  Patient location during evaluation: Endoscopy Anesthesia Type: General Level of consciousness: awake and alert Pain management: pain level controlled Vital Signs Assessment: post-procedure vital signs reviewed and stable Respiratory status: spontaneous breathing, nonlabored ventilation, respiratory function stable and patient connected to nasal cannula oxygen Cardiovascular status: blood pressure returned to baseline and stable Postop Assessment: no apparent nausea or vomiting Anesthetic complications: no     Last Vitals:  Vitals:   09/17/18 1454 09/17/18 1504  BP: 108/78 122/69  Pulse: 63 64  Resp: 17 12  Temp:    SpO2: 99% 99%    Last Pain:  Vitals:   09/17/18 1504  TempSrc:   PainSc: 0-No pain                 Martha Clan

## 2018-09-21 LAB — SURGICAL PATHOLOGY

## 2018-12-14 ENCOUNTER — Emergency Department
Admission: EM | Admit: 2018-12-14 | Discharge: 2018-12-14 | Disposition: A | Discharge status: Home / Self Care | Payer: Medicare Other | Attending: Emergency Medicine | Admitting: Emergency Medicine

## 2018-12-14 ENCOUNTER — Other Ambulatory Visit: Payer: Self-pay

## 2018-12-14 DIAGNOSIS — E860 Dehydration: Secondary | ICD-10-CM | POA: Insufficient documentation

## 2018-12-14 DIAGNOSIS — R42 Dizziness and giddiness: Secondary | ICD-10-CM | POA: Insufficient documentation

## 2018-12-14 DIAGNOSIS — E785 Hyperlipidemia, unspecified: Secondary | ICD-10-CM | POA: Diagnosis not present

## 2018-12-14 DIAGNOSIS — I10 Essential (primary) hypertension: Secondary | ICD-10-CM | POA: Diagnosis not present

## 2018-12-14 LAB — CBC WITH DIFFERENTIAL/PLATELET
Abs Immature Granulocytes: 0.01 10*3/uL (ref 0.00–0.07)
Basophils Absolute: 0 10*3/uL (ref 0.0–0.1)
Basophils Relative: 1 %
Eosinophils Absolute: 0.1 10*3/uL (ref 0.0–0.5)
Eosinophils Relative: 3 %
HCT: 41.9 % (ref 39.0–52.0)
Hemoglobin: 13.7 g/dL (ref 13.0–17.0)
Immature Granulocytes: 0 %
Lymphocytes Relative: 30 %
Lymphs Abs: 1.3 10*3/uL (ref 0.7–4.0)
MCH: 30.9 pg (ref 26.0–34.0)
MCHC: 32.7 g/dL (ref 30.0–36.0)
MCV: 94.6 fL (ref 80.0–100.0)
Monocytes Absolute: 0.5 10*3/uL (ref 0.1–1.0)
Monocytes Relative: 12 %
Neutro Abs: 2.2 10*3/uL (ref 1.7–7.7)
Neutrophils Relative %: 54 %
Platelets: 214 10*3/uL (ref 150–400)
RBC: 4.43 MIL/uL (ref 4.22–5.81)
RDW: 13.1 % (ref 11.5–15.5)
WBC: 4.2 10*3/uL (ref 4.0–10.5)
nRBC: 0 % (ref 0.0–0.2)

## 2018-12-14 LAB — COMPREHENSIVE METABOLIC PANEL
ALT: 17 U/L (ref 0–44)
AST: 24 U/L (ref 15–41)
Albumin: 4.2 g/dL (ref 3.5–5.0)
Alkaline Phosphatase: 39 U/L (ref 38–126)
Anion gap: 9 (ref 5–15)
BUN: 30 mg/dL — ABNORMAL HIGH (ref 8–23)
CO2: 26 mmol/L (ref 22–32)
Calcium: 9.2 mg/dL (ref 8.9–10.3)
Chloride: 105 mmol/L (ref 98–111)
Creatinine, Ser: 1.29 mg/dL — ABNORMAL HIGH (ref 0.61–1.24)
GFR calc Af Amer: >60 mL/min (ref >60)
GFR calc non Af Amer: 57 mL/min — ABNORMAL LOW (ref >60)
Glucose, Bld: 107 mg/dL — ABNORMAL HIGH (ref 70–99)
Potassium: 4 mmol/L (ref 3.5–5.1)
Sodium: 140 mmol/L (ref 135–145)
Total Bilirubin: 0.8 mg/dL (ref 0.3–1.2)
Total Protein: 7.4 g/dL (ref 6.5–8.1)

## 2018-12-14 LAB — URINALYSIS, COMPLETE (UACMP) WITH MICROSCOPIC
Bacteria, UA: NONE SEEN
Bilirubin Urine: NEGATIVE
Glucose, UA: NEGATIVE mg/dL
Hgb urine dipstick: NEGATIVE
Ketones, ur: NEGATIVE mg/dL
Leukocytes,Ua: NEGATIVE
Nitrite: NEGATIVE
Protein, ur: NEGATIVE mg/dL
Specific Gravity, Urine: 1.014 (ref 1.005–1.030)
Squamous Epithelial / HPF: NONE SEEN (ref 0–5)
pH: 5 (ref 5.0–8.0)

## 2018-12-14 LAB — TROPONIN I (HIGH SENSITIVITY): Troponin I (High Sensitivity): 5 ng/L (ref <18)

## 2018-12-14 MED ORDER — SODIUM CHLORIDE 0.9 % IV SOLN
1000.0000 mL | Freq: Once | INTRAVENOUS | Status: AC
  Administered 2018-12-14: 1000 mL via INTRAVENOUS

## 2018-12-14 NOTE — ED Triage Notes (Signed)
Pt arrived via EMS. Per [facility], pt complains of dizziness, with warmth down the right side.  Hx of pinched nerve.  EMS VS- BP 152/79, T 97.7, O2 98% RA.

## 2018-12-14 NOTE — ED Provider Notes (Signed)
[facility] Emergency Department Provider Note       Time seen: ----------------------------------------- 8:37 AM on 12/14/2018 -----------------------------------------   I have reviewed the triage vital signs and the nursing notes.  HISTORY   Chief Complaint Dizziness    HPI Timothy Nicholson is a 68 y.o. male with a history of arthritis, colon cancer, hyperlipidemia, GERD, hypertension who presents to the ED for complaints of dizziness with warmth down his right side.  Patient states he felt near syncopal at home, did not want to pass out in front of his wife and 2 dogs so he called EMS.  He  denies fevers, chills or other complaints.  Past Medical History:  Diagnosis Date  . Arthritis    osteo  . Back pain   . Benign prostatic hyperplasia with urinary obstruction and other lower urinary tract symptoms   . Cancer ([place])   . Dyslipidemia   . Elevated PSA   . Enlarged prostate   . Family history of breast cancer   . Family history of colon cancer   . Family history of lung cancer   . Family history of pancreatic cancer   . Family history of prostate cancer   . GERD (gastroesophageal reflux disease)   . Headache    migraine without aura  . HOH (hard of hearing)   . Hypercholesterolemia   . Hypertension   . Personal history of colon cancer 01/19/2018    Patient Active Problem List   Diagnosis Date Noted  . Genetic testing 02/01/2018  . Personal history of colon cancer 01/19/2018  . Family history of colon cancer   . Family history of lung cancer   . Family history of breast cancer   . Family history of prostate cancer   . Family history of pancreatic cancer     Past Surgical History:  Procedure Laterality Date  . COLONOSCOPY  08/27/2010   Dr. Gustavo Lah Tubular adenomas, FH polyps  . COLONOSCOPY    . COLONOSCOPY WITH PROPOFOL N/A 11/19/2017   Procedure: COLONOSCOPY WITH PROPOFOL;  Surgeon: Lollie Sails, MD;  Location: [facility] ENDOSCOPY;   Service: Endoscopy;  Laterality: N/A;  . COLONOSCOPY WITH PROPOFOL N/A 04/29/2018   Procedure: COLONOSCOPY WITH PROPOFOL;  Surgeon: Lollie Sails, MD;  Location: [facility] ENDOSCOPY;  Service: Endoscopy;  Laterality: N/A;  . COLONOSCOPY WITH PROPOFOL N/A 04/30/2018   Procedure: COLONOSCOPY WITH PROPOFOL;  Surgeon: Lollie Sails, MD;  Location: [facility] ENDOSCOPY;  Service: Endoscopy;  Laterality: N/A;  . COLONOSCOPY WITH PROPOFOL N/A 05/31/2018   Procedure: COLONOSCOPY WITH PROPOFOL;  Surgeon: Lollie Sails, MD;  Location: [facility] ENDOSCOPY;  Service: Endoscopy;  Laterality: N/A;  . COLONOSCOPY WITH PROPOFOL N/A 09/17/2018   Procedure: COLONOSCOPY WITH PROPOFOL;  Surgeon: Lollie Sails, MD;  Location: [facility] ENDOSCOPY;  Service: Endoscopy;  Laterality: N/A;  . ESOPHAGOGASTRODUODENOSCOPY (EGD) WITH PROPOFOL N/A 11/19/2017   Procedure: ESOPHAGOGASTRODUODENOSCOPY (EGD) WITH PROPOFOL;  Surgeon: Lollie Sails, MD;  Location: [facility] ENDOSCOPY;  Service: Endoscopy;  Laterality: N/A;  . TONSILLECTOMY      Allergies Sulfa antibiotics  Social History Social History   Tobacco Use  . Smoking status: Never Smoker  . Smokeless tobacco: Never Used  Substance Use Topics  . Alcohol use: Not Currently  . Drug use: No   Review of Systems Constitutional: Negative for fever. Cardiovascular: Negative for chest pain. Respiratory: Negative for shortness of breath. Gastrointestinal: Negative for abdominal pain, vomiting and diarrhea. Musculoskeletal: Negative for back pain. Skin: Negative for  rash. Neurological: Negative for headaches, focal weakness or numbness.  Positive for dizziness  All systems negative/normal/unremarkable except as stated in the HPI  ____________________________________________   PHYSICAL EXAM:  VITAL SIGNS: ED Triage Vitals  Enc Vitals Group     BP 12/14/18 0835 131/77     Pulse Rate 12/14/18 0835 71     Resp 12/14/18 0835 15     Temp 12/14/18 0835 98.1 F (36.7 C)      Temp Source 12/14/18 0835 Oral     SpO2 12/14/18 0835 99 %     Weight 12/14/18 0830 180 lb (81.6 kg)     Height 12/14/18 0830 5\' 9"  (1.753 m)     Head Circumference --      Peak Flow --      Pain Score 12/14/18 0830 0     Pain Loc --      Pain Edu? --      Excl. in [place]? --    Constitutional: Alert and oriented. Well appearing and in no distress. Eyes: Conjunctivae are normal. Normal extraocular movements. ENT      Head: Normocephalic and atraumatic.      Nose: No congestion/rhinnorhea.      Mouth/Throat: Mucous membranes are moist.      Neck: No stridor. Cardiovascular: Normal rate, regular rhythm. No murmurs, rubs, or gallops. Respiratory: Normal respiratory effort without tachypnea nor retractions. Breath sounds are clear and equal bilaterally. No wheezes/rales/rhonchi. Gastrointestinal: Soft and nontender. Normal bowel sounds Musculoskeletal: Nontender with normal range of motion in extremities. No lower extremity tenderness nor edema. Neurologic:  Normal speech and language. No gross focal neurologic deficits are appreciated.  Skin:  Skin is warm, dry and intact. No rash noted. Psychiatric: Mood and affect are normal. Speech and behavior are normal.  ____________________________________________  EKG: Interpreted by me.  Sinus rhythm rate of 69 bpm, right bundle branch block, normal axis, normal QT  ____________________________________________  ED COURSE:  As part of my medical decision making, I reviewed the following data within the [place] History obtained from family if available, nursing notes, old chart and ekg, as well as notes from prior ED visits. Patient presented for dizziness, we will assess with labs and imaging as indicated at this time.   Procedures  Timothy Nicholson was evaluated in Emergency Department on 12/14/2018 for the symptoms described in the history of present illness. He was evaluated in the context of the global COVID-19 pandemic,  which necessitated consideration that the patient might be at risk for infection with the SARS-CoV-2 virus that causes COVID-19. Institutional protocols and algorithms that pertain to the evaluation of patients at risk for COVID-19 are in a state of rapid change based on information released by regulatory bodies including the CDC and federal and state organizations. These policies and algorithms were followed during the patient's care in the ED.  ____________________________________________   LABS (pertinent positives/negatives)  Labs Reviewed  COMPREHENSIVE METABOLIC PANEL - Abnormal; Notable for the following components:      Result Value   Glucose, Bld 107 (*)    BUN 30 (*)    Creatinine, Ser 1.29 (*)    GFR calc non Af Amer 57 (*)    All other components within normal limits  URINALYSIS, COMPLETE (UACMP) WITH MICROSCOPIC - Abnormal; Notable for the following components:   Color, Urine YELLOW (*)    APPearance CLEAR (*)    All other components within normal limits  CBC WITH DIFFERENTIAL/PLATELET  TROPONIN I (  HIGH SENSITIVITY)   ___________________________________________   DIFFERENTIAL DIAGNOSIS   Dehydration, electrolyte abnormality, arrhythmia, anxiety, orthostasis  FINAL ASSESSMENT AND PLAN  Dizziness   Plan: The patient had presented for dizziness. Patient's labs indicated dehydration for which she was given IV fluids.  I will hold his HCTZ and have him take lisinopril only.  Otherwise he is cleared for outpatient follow-up.   Laurence Aly, MD    Note: This note was generated in part or whole with voice recognition software. Voice recognition is usually quite accurate but there are transcription errors that can and very often do occur. I apologize for any typographical errors that were not detected and corrected.     Earleen Newport, MD 12/14/18 1140

## 2019-08-03 ENCOUNTER — Encounter (INDEPENDENT_AMBULATORY_CARE_PROVIDER_SITE_OTHER): Payer: Medicare Other | Admitting: Ophthalmology

## 2019-08-03 ENCOUNTER — Other Ambulatory Visit: Payer: Self-pay

## 2019-08-03 DIAGNOSIS — H35342 Macular cyst, hole, or pseudohole, left eye: Secondary | ICD-10-CM | POA: Diagnosis not present

## 2019-08-03 DIAGNOSIS — H43813 Vitreous degeneration, bilateral: Secondary | ICD-10-CM

## 2019-08-03 DIAGNOSIS — I10 Essential (primary) hypertension: Secondary | ICD-10-CM | POA: Diagnosis not present

## 2019-08-03 DIAGNOSIS — H35372 Puckering of macula, left eye: Secondary | ICD-10-CM | POA: Diagnosis not present

## 2019-08-03 DIAGNOSIS — H35033 Hypertensive retinopathy, bilateral: Secondary | ICD-10-CM | POA: Diagnosis not present

## 2019-08-03 DIAGNOSIS — H2513 Age-related nuclear cataract, bilateral: Secondary | ICD-10-CM

## 2019-09-05 ENCOUNTER — Other Ambulatory Visit: Payer: Self-pay

## 2019-09-05 ENCOUNTER — Encounter (INDEPENDENT_AMBULATORY_CARE_PROVIDER_SITE_OTHER): Payer: Medicare Other | Admitting: Ophthalmology

## 2019-09-05 DIAGNOSIS — H35342 Macular cyst, hole, or pseudohole, left eye: Secondary | ICD-10-CM

## 2019-09-05 DIAGNOSIS — I10 Essential (primary) hypertension: Secondary | ICD-10-CM

## 2019-09-05 DIAGNOSIS — H35033 Hypertensive retinopathy, bilateral: Secondary | ICD-10-CM | POA: Diagnosis not present

## 2019-09-05 DIAGNOSIS — H2513 Age-related nuclear cataract, bilateral: Secondary | ICD-10-CM

## 2019-09-05 DIAGNOSIS — H43813 Vitreous degeneration, bilateral: Secondary | ICD-10-CM

## 2019-09-05 NOTE — H&P (Signed)
Timothy Nicholson is an 69 y.o. male.   Chief Complaint:Loss of vision left eye   HPI: vision loss noted one year ago in left eye  Past Medical History:  Diagnosis Date  . Arthritis    osteo  . Back pain   . Benign prostatic hyperplasia with urinary obstruction and other lower urinary tract symptoms   . Cancer ([place])   . Dyslipidemia   . Elevated PSA   . Enlarged prostate   . Family history of breast cancer   . Family history of colon cancer   . Family history of lung cancer   . Family history of pancreatic cancer   . Family history of prostate cancer   . GERD (gastroesophageal reflux disease)   . Headache    migraine without aura  . HOH (hard of hearing)   . Hypercholesterolemia   . Hypertension   . Personal history of colon cancer 01/19/2018    Past Surgical History:  Procedure Laterality Date  . COLONOSCOPY  08/27/2010   Dr. Gustavo Lah Tubular adenomas, FH polyps  . COLONOSCOPY    . COLONOSCOPY WITH PROPOFOL N/A 11/19/2017   Procedure: COLONOSCOPY WITH PROPOFOL;  Surgeon: Lollie Sails, MD;  Location: [facility] ENDOSCOPY;  Service: Endoscopy;  Laterality: N/A;  . COLONOSCOPY WITH PROPOFOL N/A 04/29/2018   Procedure: COLONOSCOPY WITH PROPOFOL;  Surgeon: Lollie Sails, MD;  Location: [facility] ENDOSCOPY;  Service: Endoscopy;  Laterality: N/A;  . COLONOSCOPY WITH PROPOFOL N/A 04/30/2018   Procedure: COLONOSCOPY WITH PROPOFOL;  Surgeon: Lollie Sails, MD;  Location: [facility] ENDOSCOPY;  Service: Endoscopy;  Laterality: N/A;  . COLONOSCOPY WITH PROPOFOL N/A 05/31/2018   Procedure: COLONOSCOPY WITH PROPOFOL;  Surgeon: Lollie Sails, MD;  Location: [facility] ENDOSCOPY;  Service: Endoscopy;  Laterality: N/A;  . COLONOSCOPY WITH PROPOFOL N/A 09/17/2018   Procedure: COLONOSCOPY WITH PROPOFOL;  Surgeon: Lollie Sails, MD;  Location: [facility] ENDOSCOPY;  Service: Endoscopy;  Laterality: N/A;  . ESOPHAGOGASTRODUODENOSCOPY (EGD) WITH PROPOFOL N/A 11/19/2017   Procedure: ESOPHAGOGASTRODUODENOSCOPY  (EGD) WITH PROPOFOL;  Surgeon: Lollie Sails, MD;  Location: [facility] ENDOSCOPY;  Service: Endoscopy;  Laterality: N/A;  . TONSILLECTOMY      Family History  Problem Relation Age of Onset  . Breast cancer Mother 48  . Lung cancer Father 71       hx of smoking  . Colon polyps Father        'had 37 polyps on his last colonocospy'  . Colon cancer Maternal Uncle 75  . Lung cancer Paternal Aunt 35       hx smoking  . Pancreatic cancer Paternal Uncle        hx of alcohol abuse  . Heart disease Maternal Grandfather   . Aortic aneurysm Maternal Grandfather   . Kidney failure Paternal Grandmother   . Prostate cancer Maternal Uncle        metastatic, died of this cancer in 28's/80's  . Cirrhosis Paternal Uncle        hx of alcohol abuse  . Breast cancer Cousin        50's  . Breast cancer Cousin        50's  . Breast cancer Cousin        34's   Social History:  reports that he has never smoked. He has never used smokeless tobacco. He reports previous alcohol use. He reports that he does not use drugs.  Allergies:  Allergies  Allergen Reactions  . Sulfa Antibiotics Rash  No medications prior to admission.    Review of systems otherwise negative  There were no vitals taken for this visit.  Physical exam: Mental status: oriented x3. Eyes: See eye exam associated with this date of surgery in media tab.  Scanned in by scanning center Ears, Nose, Throat: within normal limits Neck: Within Normal limits General: within normal limits Chest: Within normal limits Breast: deferred Heart: Within normal limits Abdomen: Within normal limits GU: deferred Extremities: within normal limits Skin: within normal limits  Assessment/Plan Macular hole, left eye Plan: To [facility] for Pars plana vitrectomy, membrane peel, serum patch, gas injection left eye  Hayden Pedro 09/05/2019, 10:40 AM

## 2019-09-17 ENCOUNTER — Other Ambulatory Visit (HOSPITAL_COMMUNITY)
Admission: RE | Admit: 2019-09-17 | Discharge: 2019-09-17 | Disposition: A | Discharge status: Home / Self Care | Payer: Medicare Other | Source: Ambulatory Visit | Attending: Ophthalmology | Admitting: Ophthalmology

## 2019-09-17 DIAGNOSIS — Z20822 Contact with and (suspected) exposure to covid-19: Secondary | ICD-10-CM | POA: Diagnosis not present

## 2019-09-17 DIAGNOSIS — Z01812 Encounter for preprocedural laboratory examination: Secondary | ICD-10-CM | POA: Diagnosis present

## 2019-09-17 LAB — SARS CORONAVIRUS 2 (TAT 6-24 HRS): SARS Coronavirus 2: NEGATIVE

## 2019-09-19 ENCOUNTER — Encounter (HOSPITAL_COMMUNITY): Payer: Self-pay | Admitting: Ophthalmology

## 2019-09-19 NOTE — Progress Notes (Signed)
Timothy Nicholson denies chest pain or shortness of breath.  Patient tested negative for Covid_6/3/21_ and has been in quarantine since that time.

## 2019-09-20 ENCOUNTER — Ambulatory Visit (HOSPITAL_COMMUNITY): Payer: Medicare Other | Admitting: Anesthesiology

## 2019-09-20 ENCOUNTER — Ambulatory Visit (HOSPITAL_COMMUNITY)
Admission: RE | Admit: 2019-09-20 | Discharge: 2019-09-21 | Disposition: A | Discharge status: Home / Self Care | Payer: Medicare Other | Attending: Ophthalmology | Admitting: Ophthalmology

## 2019-09-20 ENCOUNTER — Encounter (HOSPITAL_COMMUNITY): Payer: Self-pay | Admitting: Ophthalmology

## 2019-09-20 ENCOUNTER — Encounter (HOSPITAL_COMMUNITY)
Admission: RE | Disposition: A | Discharge status: Home / Self Care | Payer: Self-pay | Source: Home / Self Care | Attending: Ophthalmology

## 2019-09-20 ENCOUNTER — Other Ambulatory Visit: Payer: Self-pay

## 2019-09-20 DIAGNOSIS — Z882 Allergy status to sulfonamides status: Secondary | ICD-10-CM | POA: Insufficient documentation

## 2019-09-20 DIAGNOSIS — H35342 Macular cyst, hole, or pseudohole, left eye: Secondary | ICD-10-CM | POA: Diagnosis present

## 2019-09-20 DIAGNOSIS — M1990 Unspecified osteoarthritis, unspecified site: Secondary | ICD-10-CM | POA: Insufficient documentation

## 2019-09-20 DIAGNOSIS — I10 Essential (primary) hypertension: Secondary | ICD-10-CM | POA: Diagnosis not present

## 2019-09-20 HISTORY — PX: LASER PHOTO ABLATION: SHX5942

## 2019-09-20 HISTORY — PX: SERUM PATCH: SHX6091

## 2019-09-20 HISTORY — PX: GAS INSERTION: SHX5336

## 2019-09-20 HISTORY — DX: Scoliosis, unspecified: M41.9

## 2019-09-20 HISTORY — PX: VITRECTOMY: SHX106

## 2019-09-20 HISTORY — PX: MEMBRANE PEEL: SHX5967

## 2019-09-20 HISTORY — PX: PARS PLANA VITRECTOMY W/ SCLERAL BUCKLE: SHX2171

## 2019-09-20 LAB — CBC
HCT: 44.5 % (ref 39.0–52.0)
Hemoglobin: 14.2 g/dL (ref 13.0–17.0)
MCH: 31.1 pg (ref 26.0–34.0)
MCHC: 31.9 g/dL (ref 30.0–36.0)
MCV: 97.6 fL (ref 80.0–100.0)
Platelets: 191 10*3/uL (ref 150–400)
RBC: 4.56 MIL/uL (ref 4.22–5.81)
RDW: 13.2 % (ref 11.5–15.5)
WBC: 4.4 10*3/uL (ref 4.0–10.5)
nRBC: 0 % (ref 0.0–0.2)

## 2019-09-20 LAB — BASIC METABOLIC PANEL
Anion gap: 9 (ref 5–15)
BUN: 24 mg/dL — ABNORMAL HIGH (ref 8–23)
CO2: 28 mmol/L (ref 22–32)
Calcium: 9.3 mg/dL (ref 8.9–10.3)
Chloride: 104 mmol/L (ref 98–111)
Creatinine, Ser: 1.36 mg/dL — ABNORMAL HIGH (ref 0.61–1.24)
GFR calc Af Amer: >60 mL/min (ref >60)
GFR calc non Af Amer: 53 mL/min — ABNORMAL LOW (ref >60)
Glucose, Bld: 100 mg/dL — ABNORMAL HIGH (ref 70–99)
Potassium: 3.9 mmol/L (ref 3.5–5.1)
Sodium: 141 mmol/L (ref 135–145)

## 2019-09-20 LAB — AUTOLOGOUS SERUM PATCH PREP

## 2019-09-20 SURGERY — PARS PLANA VITRECTOMY WITH LASER FOR MACULAR HOLE
Anesthesia: General | Site: Eye | Laterality: Left

## 2019-09-20 MED ORDER — ORAL CARE MOUTH RINSE: 15.0000 mL | Freq: Once | OROMUCOSAL | Status: AC

## 2019-09-20 MED ORDER — ONDANSETRON HCL 4 MG/2ML IJ SOLN
4.0000 mg | Freq: Four times a day (QID) | INTRAMUSCULAR | Status: DC
  Administered 2019-09-20 – 2019-09-21 (×2): 4 mg via INTRAVENOUS
  Filled 2019-09-20 (×3): qty 2

## 2019-09-20 MED ORDER — ATROPINE SULFATE 1 % OP SOLN
OPHTHALMIC | Status: DC | PRN
  Administered 2019-09-20: 1 [drp] via OPHTHALMIC

## 2019-09-20 MED ORDER — HYDROCODONE-ACETAMINOPHEN 5-325 MG PO TABS
1.0000 | ORAL_TABLET | ORAL | Status: DC | PRN
  Administered 2019-09-20: 2 via ORAL
  Filled 2019-09-20: qty 2

## 2019-09-20 MED ORDER — SODIUM HYALURONATE 10 MG/ML IO SOLN
INTRAOCULAR | Status: AC
  Filled 2019-09-20: qty 0.85

## 2019-09-20 MED ORDER — MAGNESIUM HYDROXIDE 400 MG/5ML PO SUSP: 15.0000 mL | Freq: Four times a day (QID) | ORAL | Status: DC | PRN

## 2019-09-20 MED ORDER — HEMOSTATIC AGENTS (NO CHARGE) OPTIME
TOPICAL | Status: DC | PRN
  Administered 2019-09-20: 1 via TOPICAL

## 2019-09-20 MED ORDER — SODIUM CHLORIDE 0.45 % IV SOLN: INTRAVENOUS | Status: DC

## 2019-09-20 MED ORDER — HYALURONIDASE HUMAN 150 UNIT/ML IJ SOLN
INTRAMUSCULAR | Status: AC
  Filled 2019-09-20: qty 1

## 2019-09-20 MED ORDER — HYDROCHLOROTHIAZIDE 12.5 MG PO CAPS
12.5000 mg | ORAL_CAPSULE | Freq: Every day | ORAL | Status: DC
  Administered 2019-09-20: 12.5 mg via ORAL
  Filled 2019-09-20: qty 1

## 2019-09-20 MED ORDER — FENTANYL CITRATE (PF) 100 MCG/2ML IJ SOLN
INTRAMUSCULAR | Status: DC | PRN
  Administered 2019-09-20: 100 ug via INTRAVENOUS

## 2019-09-20 MED ORDER — BSS PLUS IO SOLN
INTRAOCULAR | Status: AC
  Filled 2019-09-20: qty 500

## 2019-09-20 MED ORDER — BACITRACIN-POLYMYXIN B 500-10000 UNIT/GM OP OINT
TOPICAL_OINTMENT | OPHTHALMIC | Status: DC | PRN
  Administered 2019-09-20: 1 via OPHTHALMIC

## 2019-09-20 MED ORDER — LISINOPRIL 10 MG PO TABS
10.0000 mg | ORAL_TABLET | Freq: Every day | ORAL | Status: DC
  Administered 2019-09-20: 10 mg via ORAL
  Filled 2019-09-20: qty 1

## 2019-09-20 MED ORDER — FINASTERIDE 5 MG PO TABS
5.0000 mg | ORAL_TABLET | Freq: Every day | ORAL | Status: DC
  Administered 2019-09-20: 5 mg via ORAL
  Filled 2019-09-20: qty 1

## 2019-09-20 MED ORDER — TEMAZEPAM 15 MG PO CAPS: 15.0000 mg | ORAL_CAPSULE | Freq: Every evening | ORAL | Status: DC | PRN

## 2019-09-20 MED ORDER — BUPIVACAINE HCL (PF) 0.75 % IJ SOLN
INTRAMUSCULAR | Status: DC | PRN
  Administered 2019-09-20: 10 mL

## 2019-09-20 MED ORDER — EPHEDRINE SULFATE 50 MG/ML IJ SOLN
INTRAMUSCULAR | Status: DC | PRN
  Administered 2019-09-20: 5 mg via INTRAVENOUS

## 2019-09-20 MED ORDER — FENTANYL CITRATE (PF) 250 MCG/5ML IJ SOLN
INTRAMUSCULAR | Status: AC
  Filled 2019-09-20: qty 5

## 2019-09-20 MED ORDER — ONDANSETRON HCL 4 MG/2ML IJ SOLN
INTRAMUSCULAR | Status: DC | PRN
  Administered 2019-09-20: 4 mg via INTRAVENOUS

## 2019-09-20 MED ORDER — BACITRACIN-POLYMYXIN B 500-10000 UNIT/GM OP OINT
1.0000 "application " | TOPICAL_OINTMENT | Freq: Three times a day (TID) | OPHTHALMIC | Status: DC
  Filled 2019-09-20 (×3): qty 3.5

## 2019-09-20 MED ORDER — LIDOCAINE HCL (CARDIAC) PF 100 MG/5ML IV SOSY
PREFILLED_SYRINGE | INTRAVENOUS | Status: DC | PRN
  Administered 2019-09-20: 60 mg via INTRAVENOUS

## 2019-09-20 MED ORDER — BACITRACIN-POLYMYXIN B 500-10000 UNIT/GM OP OINT
TOPICAL_OINTMENT | OPHTHALMIC | Status: AC
  Filled 2019-09-20: qty 3.5

## 2019-09-20 MED ORDER — PHENYLEPHRINE 40 MCG/ML (10ML) SYRINGE FOR IV PUSH (FOR BLOOD PRESSURE SUPPORT)
PREFILLED_SYRINGE | INTRAVENOUS | Status: AC
  Filled 2019-09-20: qty 10

## 2019-09-20 MED ORDER — BSS IO SOLN
INTRAOCULAR | Status: DC | PRN
  Administered 2019-09-20: 15 mL via INTRAOCULAR

## 2019-09-20 MED ORDER — ACETAMINOPHEN 500 MG PO TABS
1000.0000 mg | ORAL_TABLET | Freq: Once | ORAL | Status: AC
  Administered 2019-09-20: 1000 mg via ORAL
  Filled 2019-09-20: qty 2

## 2019-09-20 MED ORDER — STERILE WATER FOR INJECTION IJ SOLN
INTRAMUSCULAR | Status: AC
  Filled 2019-09-20: qty 20

## 2019-09-20 MED ORDER — TETRACAINE HCL 0.5 % OP SOLN
2.0000 [drp] | Freq: Once | OPHTHALMIC | Status: DC
  Filled 2019-09-20: qty 4

## 2019-09-20 MED ORDER — DORZOLAMIDE HCL-TIMOLOL MAL 2-0.5 % OP SOLN
1.0000 [drp] | Freq: Once | OPHTHALMIC | Status: AC
  Administered 2019-09-20: 1 [drp] via OPHTHALMIC
  Filled 2019-09-20: qty 10

## 2019-09-20 MED ORDER — MIDAZOLAM HCL 5 MG/5ML IJ SOLN
INTRAMUSCULAR | Status: DC | PRN
  Administered 2019-09-20: 2 mg via INTRAVENOUS

## 2019-09-20 MED ORDER — FENTANYL CITRATE (PF) 100 MCG/2ML IJ SOLN: 25.0000 ug | INTRAMUSCULAR | Status: DC | PRN

## 2019-09-20 MED ORDER — CEFTAZIDIME 1 G IJ SOLR
INTRAMUSCULAR | Status: AC
  Filled 2019-09-20: qty 1

## 2019-09-20 MED ORDER — EPINEPHRINE PF 1 MG/ML IJ SOLN
INTRAMUSCULAR | Status: AC
  Filled 2019-09-20: qty 1

## 2019-09-20 MED ORDER — PHENYLEPHRINE HCL (PRESSORS) 10 MG/ML IV SOLN
INTRAVENOUS | Status: DC | PRN
  Administered 2019-09-20: 120 ug via INTRAVENOUS
  Administered 2019-09-20 (×2): 80 ug via INTRAVENOUS

## 2019-09-20 MED ORDER — ACETAZOLAMIDE SODIUM 500 MG IJ SOLR
INTRAMUSCULAR | Status: AC
  Filled 2019-09-20: qty 500

## 2019-09-20 MED ORDER — ACETAMINOPHEN 500 MG PO TABS: 500.0000 mg | ORAL_TABLET | Freq: Four times a day (QID) | ORAL | Status: DC | PRN

## 2019-09-20 MED ORDER — BUPIVACAINE HCL (PF) 0.75 % IJ SOLN
INTRAMUSCULAR | Status: AC
  Filled 2019-09-20: qty 10

## 2019-09-20 MED ORDER — GATIFLOXACIN 0.5 % OP SOLN
1.0000 [drp] | Freq: Four times a day (QID) | OPHTHALMIC | Status: DC
  Filled 2019-09-20: qty 2.5

## 2019-09-20 MED ORDER — ATROPINE SULFATE 1 % OP SOLN
OPHTHALMIC | Status: AC
  Filled 2019-09-20: qty 5

## 2019-09-20 MED ORDER — CYCLOPENTOLATE HCL 1 % OP SOLN
1.0000 [drp] | OPHTHALMIC | Status: AC | PRN
  Administered 2019-09-20 (×3): 1 [drp] via OPHTHALMIC
  Filled 2019-09-20: qty 2

## 2019-09-20 MED ORDER — LIDOCAINE HCL 2 % IJ SOLN
INTRAMUSCULAR | Status: AC
  Filled 2019-09-20: qty 20

## 2019-09-20 MED ORDER — SODIUM CHLORIDE 0.9 % IV SOLN: INTRAVENOUS | Status: DC

## 2019-09-20 MED ORDER — SODIUM HYALURONATE 10 MG/ML IO SOLN
INTRAOCULAR | Status: DC | PRN
  Administered 2019-09-20: 0.85 mL via INTRAOCULAR

## 2019-09-20 MED ORDER — HYPROMELLOSE (GONIOSCOPIC) 2.5 % OP SOLN
OPHTHALMIC | Status: AC
  Filled 2019-09-20: qty 15

## 2019-09-20 MED ORDER — PROPOFOL 10 MG/ML IV BOLUS
INTRAVENOUS | Status: AC
  Filled 2019-09-20: qty 20

## 2019-09-20 MED ORDER — ROSUVASTATIN CALCIUM 5 MG PO TABS
10.0000 mg | ORAL_TABLET | Freq: Every day | ORAL | Status: DC
  Administered 2019-09-20: 10 mg via ORAL
  Filled 2019-09-20: qty 2

## 2019-09-20 MED ORDER — AMISULPRIDE (ANTIEMETIC) 5 MG/2ML IV SOLN: 5.0000 mg | Freq: Once | INTRAVENOUS | Status: DC

## 2019-09-20 MED ORDER — LISINOPRIL-HYDROCHLOROTHIAZIDE 10-12.5 MG PO TABS: 1.0000 | ORAL_TABLET | Freq: Every day | ORAL | Status: DC

## 2019-09-20 MED ORDER — DORZOLAMIDE HCL 2 % OP SOLN
1.0000 [drp] | Freq: Three times a day (TID) | OPHTHALMIC | Status: DC
  Filled 2019-09-20: qty 10

## 2019-09-20 MED ORDER — LACTATED RINGERS IV SOLN: INTRAVENOUS | Status: DC

## 2019-09-20 MED ORDER — DEXAMETHASONE SODIUM PHOSPHATE 4 MG/ML IJ SOLN
INTRAMUSCULAR | Status: DC | PRN
  Administered 2019-09-20: 5 mg via INTRAVENOUS

## 2019-09-20 MED ORDER — GLYCOPYRROLATE 0.2 MG/ML IJ SOLN
INTRAMUSCULAR | Status: DC | PRN
  Administered 2019-09-20: .2 mg via INTRAVENOUS

## 2019-09-20 MED ORDER — PROPOFOL 10 MG/ML IV BOLUS
INTRAVENOUS | Status: DC | PRN
  Administered 2019-09-20: 125 mg via INTRAVENOUS

## 2019-09-20 MED ORDER — LATANOPROST 0.005 % OP SOLN
1.0000 [drp] | Freq: Every day | OPHTHALMIC | Status: DC
  Filled 2019-09-20 (×2): qty 2.5

## 2019-09-20 MED ORDER — DEXAMETHASONE SODIUM PHOSPHATE 10 MG/ML IJ SOLN
INTRAMUSCULAR | Status: AC
  Filled 2019-09-20: qty 1

## 2019-09-20 MED ORDER — PANTOPRAZOLE SODIUM 40 MG PO TBEC: 40.0000 mg | DELAYED_RELEASE_TABLET | Freq: Every day | ORAL | Status: DC

## 2019-09-20 MED ORDER — GATIFLOXACIN 0.5 % OP SOLN
1.0000 [drp] | OPHTHALMIC | Status: AC | PRN
  Administered 2019-09-20 (×3): 1 [drp] via OPHTHALMIC
  Filled 2019-09-20: qty 2.5

## 2019-09-20 MED ORDER — AMISULPRIDE (ANTIEMETIC) 5 MG/2ML IV SOLN
INTRAVENOUS | Status: AC
  Filled 2019-09-20: qty 2

## 2019-09-20 MED ORDER — SUGAMMADEX SODIUM 200 MG/2ML IV SOLN
INTRAVENOUS | Status: DC | PRN
  Administered 2019-09-20: 400 mg via INTRAVENOUS

## 2019-09-20 MED ORDER — CHLORHEXIDINE GLUCONATE 0.12 % MT SOLN
15.0000 mL | Freq: Once | OROMUCOSAL | Status: AC
  Administered 2019-09-20: 15 mL via OROMUCOSAL
  Filled 2019-09-20: qty 15

## 2019-09-20 MED ORDER — POLYMYXIN B SULFATE 500000 UNITS IJ SOLR
INTRAMUSCULAR | Status: AC
  Filled 2019-09-20: qty 500000

## 2019-09-20 MED ORDER — MORPHINE SULFATE (PF) 2 MG/ML IV SOLN: 1.0000 mg | INTRAVENOUS | Status: DC | PRN

## 2019-09-20 MED ORDER — BRIMONIDINE TARTRATE 0.2 % OP SOLN
1.0000 [drp] | Freq: Two times a day (BID) | OPHTHALMIC | Status: DC
  Filled 2019-09-20: qty 5

## 2019-09-20 MED ORDER — SODIUM CHLORIDE (PF) 0.9 % IJ SOLN
INTRAMUSCULAR | Status: AC
  Filled 2019-09-20: qty 10

## 2019-09-20 MED ORDER — TROPICAMIDE 1 % OP SOLN
1.0000 [drp] | OPHTHALMIC | Status: AC | PRN
  Administered 2019-09-20 (×3): 1 [drp] via OPHTHALMIC
  Filled 2019-09-20: qty 15

## 2019-09-20 MED ORDER — PHENYLEPHRINE HCL 2.5 % OP SOLN
1.0000 [drp] | OPHTHALMIC | Status: AC | PRN
  Administered 2019-09-20 (×3): 1 [drp] via OPHTHALMIC
  Filled 2019-09-20: qty 2

## 2019-09-20 MED ORDER — TRIAMCINOLONE ACETONIDE 40 MG/ML IJ SUSP
INTRAMUSCULAR | Status: AC
  Filled 2019-09-20: qty 5

## 2019-09-20 MED ORDER — ACETAZOLAMIDE SODIUM 500 MG IJ SOLR
500.0000 mg | Freq: Once | INTRAMUSCULAR | Status: AC
  Administered 2019-09-21: 500 mg via INTRAVENOUS
  Filled 2019-09-20 (×2): qty 500

## 2019-09-20 MED ORDER — CEFAZOLIN SODIUM-DEXTROSE 2-4 GM/100ML-% IV SOLN
2.0000 g | INTRAVENOUS | Status: AC
  Administered 2019-09-20: 2 g via INTRAVENOUS
  Filled 2019-09-20: qty 100

## 2019-09-20 MED ORDER — ACETAMINOPHEN 325 MG PO TABS
325.0000 mg | ORAL_TABLET | ORAL | Status: DC | PRN
  Administered 2019-09-20: 650 mg via ORAL
  Filled 2019-09-20: qty 2

## 2019-09-20 MED ORDER — PREDNISOLONE ACETATE 1 % OP SUSP
1.0000 [drp] | Freq: Four times a day (QID) | OPHTHALMIC | Status: DC
  Filled 2019-09-20: qty 5

## 2019-09-20 MED ORDER — BSS IO SOLN
INTRAOCULAR | Status: AC
  Filled 2019-09-20: qty 15

## 2019-09-20 MED ORDER — STERILE WATER FOR INJECTION IJ SOLN
INTRAMUSCULAR | Status: DC | PRN
  Administered 2019-09-20: 20 mL

## 2019-09-20 MED ORDER — MIDAZOLAM HCL 2 MG/2ML IJ SOLN
INTRAMUSCULAR | Status: AC
  Filled 2019-09-20: qty 2

## 2019-09-20 MED ORDER — DEXAMETHASONE SODIUM PHOSPHATE 10 MG/ML IJ SOLN
INTRAMUSCULAR | Status: DC | PRN
  Administered 2019-09-20: 10 mg

## 2019-09-20 MED ORDER — EPINEPHRINE PF 1 MG/ML IJ SOLN
INTRAOCULAR | Status: DC | PRN
  Administered 2019-09-20: 500 mL

## 2019-09-20 MED ORDER — ROCURONIUM BROMIDE 100 MG/10ML IV SOLN
INTRAVENOUS | Status: DC | PRN
  Administered 2019-09-20: 70 mg via INTRAVENOUS
  Administered 2019-09-20: 10 mg via INTRAVENOUS

## 2019-09-20 SURGICAL SUPPLY — 71 items
ACCESSORY FRAGMATOME (MISCELLANEOUS) ×2 IMPLANT
APPLICATOR DR MATTHEWS STRL (MISCELLANEOUS) ×2 IMPLANT
BAND WRIST GAS GREEN (MISCELLANEOUS) IMPLANT
BLADE EYE CATARACT 19 1.4 BEAV (BLADE) IMPLANT
BLADE MVR KNIFE 19G (BLADE) IMPLANT
BLADE MVR KNIFE 20G (BLADE) ×3 IMPLANT
CANNULA ANT CHAM MAIN (OPHTHALMIC RELATED) IMPLANT
CANNULA FLEX TIP 25G (CANNULA) ×3 IMPLANT
CANNULA SUBRETINAL FLUID 20G (BLADE) ×3 IMPLANT
CORD BIPOLAR FORCEPS 12FT (ELECTRODE) ×2 IMPLANT
COTTONBALL LRG STERILE PKG (GAUZE/BANDAGES/DRESSINGS) ×9 IMPLANT
COVER MAYO STAND STRL (DRAPES) IMPLANT
COVER WAND RF STERILE (DRAPES) ×3 IMPLANT
DRAPE OPHTHALMIC 77X100 STRL (CUSTOM PROCEDURE TRAY) ×3 IMPLANT
ERASER HMR WETFIELD 23G BP (MISCELLANEOUS) IMPLANT
FILTER BLUE MILLIPORE (MISCELLANEOUS) ×6 IMPLANT
FILTER STRAW FLUID ASPIR (MISCELLANEOUS) ×3 IMPLANT
GAS AUTO FILL CONSTEL (OPHTHALMIC) ×3
GAS AUTO FILL CONSTELLATION (OPHTHALMIC) IMPLANT
GAS IO PFP 125GM ISPAN CNSTL (MISCELLANEOUS) IMPLANT
GAS TANK CONSTELL (MISCELLANEOUS) ×2
GAS WRIST BAND GREEN (MISCELLANEOUS) ×2
GLOVE SS BIOGEL STRL SZ 6.5 (GLOVE) ×1 IMPLANT
GLOVE SS BIOGEL STRL SZ 7 (GLOVE) ×1 IMPLANT
GLOVE SUPERSENSE BIOGEL SZ 6.5 (GLOVE) ×2
GLOVE SUPERSENSE BIOGEL SZ 7 (GLOVE) ×2
GLOVE SURG 8.5 LATEX PF (GLOVE) ×3 IMPLANT
GLOVE TRIUMPH SURG SIZE 8.5 (KITS) ×3 IMPLANT
GOWN STRL REUS W/ TWL LRG LVL3 (GOWN DISPOSABLE) ×3 IMPLANT
GOWN STRL REUS W/TWL LRG LVL3 (GOWN DISPOSABLE) ×6
ILLUMINATOR CHOW PICK 25GA (MISCELLANEOUS) ×3 IMPLANT
KIT BASIN OR (CUSTOM PROCEDURE TRAY) ×3 IMPLANT
KIT TURNOVER KIT B (KITS) ×3 IMPLANT
KNIFE CRESCENT 1.75 EDGEAHEAD (BLADE) ×3 IMPLANT
KNIFE GRIESHABER SHARP 2.5MM (MISCELLANEOUS) IMPLANT
NDL 18GX1X1/2 (RX/OR ONLY) (NEEDLE) ×1 IMPLANT
NDL 25GX 5/8IN NON SAFETY (NEEDLE) ×1 IMPLANT
NDL HYPO 30X.5 LL (NEEDLE) ×2 IMPLANT
NDL PRECISIONGLIDE 27X1.5 (NEEDLE) IMPLANT
NEEDLE 18GX1X1/2 (RX/OR ONLY) (NEEDLE) ×3 IMPLANT
NEEDLE 25GX 5/8IN NON SAFETY (NEEDLE) ×3 IMPLANT
NEEDLE HYPO 30X.5 LL (NEEDLE) ×6 IMPLANT
NEEDLE PRECISIONGLIDE 27X1.5 (NEEDLE) IMPLANT
NS IRRIG 1000ML POUR BTL (IV SOLUTION) ×3 IMPLANT
PACK VITRECTOMY CUSTOM (CUSTOM PROCEDURE TRAY) ×3 IMPLANT
PAD ARMBOARD 7.5X6 YLW CONV (MISCELLANEOUS) ×6 IMPLANT
PAK PIK VITRECTOMY CVS 25GA (OPHTHALMIC) IMPLANT
PROBE DIRECTIONAL LASER (MISCELLANEOUS) ×2 IMPLANT
PROBE LASER ILLUM FLEX CVD 25G (OPHTHALMIC) ×2 IMPLANT
REPL STRA BRUSH NDL (NEEDLE) ×1 IMPLANT
REPL STRA BRUSH NEEDLE (NEEDLE) ×3 IMPLANT
RESERVOIR BACK FLUSH (MISCELLANEOUS) ×3 IMPLANT
ROLLS DENTAL (MISCELLANEOUS) ×6 IMPLANT
SCRAPER DIAMOND 25GA (OPHTHALMIC RELATED) IMPLANT
SCRAPER DIAMOND DUST MEMBRANE (MISCELLANEOUS) ×3 IMPLANT
SPONGE SURGIFOAM ABS GEL 12-7 (HEMOSTASIS) ×3 IMPLANT
STOPCOCK 4 WAY LG BORE MALE ST (IV SETS) ×3 IMPLANT
SUT CHROMIC 7 0 TG140 8 (SUTURE) IMPLANT
SUT ETHILON 9 0 TG140 8 (SUTURE) ×3 IMPLANT
SUT POLY NON ABSORB 10-0 8 STR (SUTURE) IMPLANT
SUT SILK 4 0 RB 1 (SUTURE) IMPLANT
SYR 10ML LL (SYRINGE) ×3 IMPLANT
SYR 20ML LL LF (SYRINGE) ×3 IMPLANT
SYR 50ML LL SCALE MARK (SYRINGE) ×3 IMPLANT
SYR 5ML LL (SYRINGE) IMPLANT
SYR BULB EAR ULCER 3OZ GRN STR (SYRINGE) ×3 IMPLANT
SYR TB 1ML LUER SLIP (SYRINGE) ×3 IMPLANT
TOWEL GREEN STERILE FF (TOWEL DISPOSABLE) ×9 IMPLANT
TUBING HIGH PRESS EXTEN 6IN (TUBING) IMPLANT
WATER STERILE IRR 1000ML POUR (IV SOLUTION) ×3 IMPLANT
WIPE INSTRUMENT VISIWIPE 73X73 (MISCELLANEOUS) ×3 IMPLANT

## 2019-09-20 NOTE — Op Note (Signed)
NAME: Milberger, [place] DD:[id number] ACCOUNT [id number] DATE OF BIRTH:07-25-1950 FACILITY: [facility] LOCATION: [facility] PHYSICIAN:Adron Geisel D. Jodilyn Giese, MD  OPERATIVE REPORT  DATE OF PROCEDURE:  09/20/2019  ADMISSION DIAGNOSIS:  Macular hole, left eye.  PROCEDURES:   1.  Pars plana vitrectomy.  2.  Removal of internal limiting with membrane peel.  3.  Serum patch.   4.  Gas fluid exchange.  5.  Retinal photocoagulation in the left eye.  SURGEON:  Tempie Hoist, MD  ASSISTANT:   Deatra Ina, SA.  ANESTHESIA:  General.  DESCRIPTION OF PROCEDURE:  The usual prep and drape.  The indirect ophthalmoscope laser was moved into place and 907 burns were placed around the retinal periphery in 2 concentric rows.  The power was between 340 and 500 milliwatts, 1000 microns each and  0.1 seconds each.  Attention was then carried to the pars plana area where a conjunctival incision was made at 2 o'clock  with diathermy and MVR incision into the vitreous cavity.  The diamond knife was used to form a frown incision and a pocket before  MVR incision.  A 25-gauge trocar was placed at 4 and 10 o'clock with an infusion at 4 o'clock.   The contact lens ring was anchored into place at 6 and 12 o'clock.  Provisc placed on the corneal surface and the flat contact lens was placed.  Pars plana  vitrectomy was begun just behind the cataractous lens.  The vitrectomy was carried in a core fashion down toward the macular region.  The macular hole was apparent centrally and was medium size.  A shiny membrane was seen around the hole.  The core  vitrectomy was carried out, removing vitreous debris and membranes from the core area of the vitreous cavity.  The vitrectomy was carried into the mid periphery with a 30-degree prismatic lens.  The vitreous was trimmed for 360 degrees.  The vitrectomy  was carried into the far periphery with a 30-degree prismatic lens with a magnifying center.  Additional vitreous was removed  for 360 degrees.  The flat contact lens was then placed and the macular hole was inspected.  The silicone tipped suction line  was brought into the eye and the FISH strike sign occurred.  The posterior hyaloid was lifted with the silicone tipped suction line and it was pulled away with the lighted pick.  The vitreous cutter was used to remove this posterior hyaloid.  Diamond  dusted membrane scraper was then placed into the vitreous cavity and the areas around the macular hole were treated with the diamond dusted membrane scraper.  Wispy grey membranes were elevated and the internal limiting membrane was elevated for 1 disc  diameter around the macular hole.  These membranes were then removed with the vitreous cutter.  A total gas fluid exchange was carried out.  Endolaser was placed on what appeared to be a peripheral hole at 2 o'clock.  The gas fluid exchange was  completed.  Sufficient time was allowed for additional fluid to track down the walls of the eye and collect in the posterior segment.  The serum patch and the C3F8 14% gas mixture was prepared during this time.  The silicone tipped suction line was  brought back into the eye and the posterior fluid was removed.  The serum patch was delivered.  Additional posterior fluid was removed with the silicone tipped suction line.  The gas exchange was carried out with C3F8 14%.  Once the total gas fluid  exchange was carried out, the instruments were removed from the eye and 9-0 nylon was used to close the sclerotomy site at 2 o'clock.  The 25-gauge trocars were removed.  The wounds were tested and found to be secure.  Polymyxin and ceftazidime were  rinsed around the globe for antibiotic coverage.  Decadron 10 mg was injected into the lower subconjunctival space.  Atropine solution was applied.  Marcaine was injected around the globe for postoperative pain.  Polysporin ophthalmic ointment, a patch  and a shield were placed.  The closing pressure was 10  with a Barraquer tonometer.  COMPLICATIONS:  None.  DURATION:  One hour 20 minutes.  VN/NUANCE  D:09/20/2019 T:09/20/2019 JOB:[id number]

## 2019-09-20 NOTE — Progress Notes (Signed)
Green Occupational psychologist place on patients left wrist.

## 2019-09-20 NOTE — Anesthesia Postprocedure Evaluation (Signed)
Anesthesia Post Note  Patient: Timothy Nicholson  Procedure(s) Performed: 25 Gauge PARS PLANA VITRECTOMY WITH LASER FOR MACULAR HOLE (Left Eye) INSERTION OF GAS (Left Eye) SERUM PATCH (Left Eye) Membrane Peel (Left Eye) Laser Photo Ablation (Left Eye)     Patient location during evaluation: PACU Anesthesia Type: General Level of consciousness: awake and alert Pain management: pain level controlled Vital Signs Assessment: post-procedure vital signs reviewed and stable Respiratory status: spontaneous breathing, nonlabored ventilation and respiratory function stable Cardiovascular status: blood pressure returned to baseline and stable Postop Assessment: no apparent nausea or vomiting Anesthetic complications: no    Last Vitals:  Vitals:   09/20/19 1500 09/20/19 1516  BP:  120/88  Pulse:  83  Resp:  17  Temp: 36.8 C 36.8 C  SpO2:  99%    Last Pain:  Vitals:   09/20/19 1516  TempSrc: Oral  PainSc:                  Holland Kotter,W. EDMOND

## 2019-09-20 NOTE — Anesthesia Procedure Notes (Signed)
Procedure Name: Intubation Date/Time: 09/20/2019 11:48 AM Performed by: Oletta Lamas, CRNA Pre-anesthesia Checklist: Patient identified, Emergency Drugs available, Suction available and Patient being monitored Patient Re-evaluated:Patient Re-evaluated prior to induction Oxygen Delivery Method: Circle System Utilized Preoxygenation: Pre-oxygenation with 100% oxygen Induction Type: IV induction Ventilation: Mask ventilation without difficulty, Oral airway inserted - appropriate to patient size and Two handed mask ventilation required Laryngoscope Size: Glidescope and 3 Grade View: Grade I Tube type: Oral Tube size: 7.5 mm Number of attempts: 3 Airway Equipment and Method: Stylet and Oral airway Placement Confirmation: ETT inserted through vocal cords under direct vision,  positive ETCO2 and breath sounds checked- equal and bilateral Secured at: 23 cm Tube secured with: Tape Dental Injury: Teeth and Oropharynx as per pre-operative assessment and Injury to lip  Comments: 2 attempts by CRNA( MAC 4 and Mil 3 grade 3 view with both).  Only one attempt to pass tube.  No resistance met with resulting esophageal intubation. 1 attempt by MDA (fitzgerald with Mil 3 Grade 3 view).  Glidoscope obtained and intubated with ease. .  Small laceration <38m to left upper lip from direct laryngoscopy.

## 2019-09-20 NOTE — Brief Op Note (Signed)
09/20/2019  1:17 PM  PATIENT:  Timothy Nicholson  68 y.o. male  PRE-OPERATIVE DIAGNOSIS:  macular hole left  POST-OPERATIVE DIAGNOSIS:  macular hole left  PROCEDURE:  Procedure(s): 25 Gauge PARS PLANA VITRECTOMY WITH LASER FOR MACULAR HOLE (Left) INSERTION OF GAS (Left) SERUM PATCH (Left) Membrane Peel (Left) Laser Photo Ablation (Left)  SURGEON:  Surgeon(s) and Role:    Hayden Pedro, MD - Primary  Brief Operative note   Preoperative diagnosis:  macular hole left Postoperative diagnosis  * No Diagnosis Codes entered *  Procedures: @ORPROCAL @  Surgeon:  Hayden Pedro, MD...  Assistant:  Deatra Ina SA    Anesthesia: General  Specimen: none  Estimated blood loss:  1cc  Complications: none  Patient sent to PACU in good condition  Composed by Hayden Pedro MD  Dictation number: [id number]

## 2019-09-20 NOTE — Transfer of Care (Signed)
Immediate Anesthesia Transfer of Care Note  Patient: Timothy Nicholson  Procedure(s) Performed: 25 Gauge PARS PLANA VITRECTOMY WITH LASER FOR MACULAR HOLE (Left Eye) INSERTION OF GAS (Left Eye) SERUM PATCH (Left Eye) Membrane Peel (Left Eye) Laser Photo Ablation (Left Eye)  Patient Location: PACU  Anesthesia Type:General  Level of Consciousness: oriented, drowsy and pateint uncooperative  Airway & Oxygen Therapy: Patient Spontanous Breathing and Patient connected to nasal cannula oxygen  Post-op Assessment: Report given to RN and Post -op Vital signs reviewed and stable  Post vital signs: Reviewed and stable  Last Vitals:  Vitals Value Taken Time  BP 115/69 09/20/19 1320  Temp    Pulse 84 09/20/19 1321  Resp 13 09/20/19 1321  SpO2 97 % 09/20/19 1321  Vitals shown include unvalidated device data.  Last Pain:  Vitals:   09/20/19 1001  TempSrc:   PainSc: 0-No pain         Complications: No apparent anesthesia complications

## 2019-09-20 NOTE — H&P (Signed)
I examined the patient today and there is no change in the medical status 

## 2019-09-20 NOTE — Anesthesia Preprocedure Evaluation (Addendum)
Anesthesia Evaluation  Patient identified by MRN, date of birth, ID band Patient awake    Reviewed: Allergy & Precautions, H&P , NPO status , Patient's Chart, lab work & pertinent test results  Airway Mallampati: II  TM Distance: >3 FB Neck ROM: Full    Dental no notable dental hx. (+) Teeth Intact, Dental Advisory Given   Pulmonary neg pulmonary ROS,    Pulmonary exam normal breath sounds clear to auscultation       Cardiovascular hypertension, Pt. on medications  Rhythm:Regular Rate:Normal     Neuro/Psych  Headaches, negative psych ROS   GI/Hepatic Neg liver ROS, GERD  Medicated,  Endo/Other  negative endocrine ROS  Renal/GU negative Renal ROS  negative genitourinary   Musculoskeletal  (+) Arthritis , Osteoarthritis,    Abdominal   Peds  Hematology negative hematology ROS (+)   Anesthesia Other Findings   Reproductive/Obstetrics negative OB ROS                            Anesthesia Physical Anesthesia Plan  ASA: II  Anesthesia Plan: General   Post-op Pain Management:    Induction: Intravenous  PONV Risk Score and Plan: 3 and Ondansetron, Dexamethasone, Treatment Tricarico vary due to age or medical condition and Midazolam  Airway Management Planned: Oral ETT  Additional Equipment:   Intra-op Plan:   Post-operative Plan: Extubation in OR  Informed Consent: I have reviewed the patients History and Physical, chart, labs and discussed the procedure including the risks, benefits and alternatives for the proposed anesthesia with the patient or authorized representative who has indicated his/her understanding and acceptance.     Dental advisory given  Plan Discussed with: CRNA  Anesthesia Plan Comments:         Anesthesia Quick Evaluation

## 2019-09-21 DIAGNOSIS — H35342 Macular cyst, hole, or pseudohole, left eye: Secondary | ICD-10-CM | POA: Diagnosis not present

## 2019-09-21 MED ORDER — GATIFLOXACIN 0.5 % OP SOLN: 1.0000 [drp] | Freq: Four times a day (QID) | OPHTHALMIC | Status: DC

## 2019-09-21 MED ORDER — BACITRACIN-POLYMYXIN B 500-10000 UNIT/GM OP OINT: 1.0000 "application " | TOPICAL_OINTMENT | Freq: Three times a day (TID) | OPHTHALMIC | 0 refills | Status: DC

## 2019-09-21 MED ORDER — PREDNISOLONE ACETATE 1 % OP SUSP
1.0000 [drp] | Freq: Four times a day (QID) | OPHTHALMIC | 0 refills | Status: AC
Start: 2019-09-21 — End: ?

## 2019-09-21 NOTE — Progress Notes (Signed)
09/21/2019, 6:33 AM  Mental Status:  Awake, Alert, Oriented  Anterior segment: Cornea  Blood spot   Anterior Chamber Clear    Lens:   Cataract  Intra Ocular Pressure 20 mmHg with Tonopen  Vitreous: Clear 95%gas bubble   Retina:  Attached Good laser reaction     Impression: Excellent result   Final Diagnosis: Principal Problem:   Macular hole, left Active Problems:   Macular hole, left eye   Plan: start post operative eye drops.  Discharge to home.  Give post operative instructions  Timothy Nicholson 09/21/2019, 6:33 AM

## 2019-09-21 NOTE — Discharge Summary (Signed)
Discharge summary not needed on [facility] patients per medical records. 

## 2019-09-27 ENCOUNTER — Other Ambulatory Visit: Payer: Self-pay

## 2019-09-27 ENCOUNTER — Encounter (INDEPENDENT_AMBULATORY_CARE_PROVIDER_SITE_OTHER): Payer: Medicare Other | Admitting: Ophthalmology

## 2019-09-27 DIAGNOSIS — H35342 Macular cyst, hole, or pseudohole, left eye: Secondary | ICD-10-CM

## 2019-10-19 ENCOUNTER — Other Ambulatory Visit: Payer: Self-pay

## 2019-10-19 ENCOUNTER — Encounter (INDEPENDENT_AMBULATORY_CARE_PROVIDER_SITE_OTHER): Payer: Medicare Other | Admitting: Ophthalmology

## 2019-10-19 DIAGNOSIS — H35342 Macular cyst, hole, or pseudohole, left eye: Secondary | ICD-10-CM | POA: Diagnosis not present

## 2019-10-19 DIAGNOSIS — H4312 Vitreous hemorrhage, left eye: Secondary | ICD-10-CM

## 2019-11-02 ENCOUNTER — Encounter (INDEPENDENT_AMBULATORY_CARE_PROVIDER_SITE_OTHER): Payer: Medicare Other | Admitting: Ophthalmology

## 2019-11-02 ENCOUNTER — Other Ambulatory Visit: Payer: Self-pay

## 2019-11-02 DIAGNOSIS — H35342 Macular cyst, hole, or pseudohole, left eye: Secondary | ICD-10-CM

## 2019-11-02 DIAGNOSIS — H4312 Vitreous hemorrhage, left eye: Secondary | ICD-10-CM

## 2019-11-30 ENCOUNTER — Encounter (INDEPENDENT_AMBULATORY_CARE_PROVIDER_SITE_OTHER): Payer: Medicare Other | Admitting: Ophthalmology

## 2019-12-09 ENCOUNTER — Other Ambulatory Visit: Payer: Self-pay

## 2019-12-09 ENCOUNTER — Encounter (INDEPENDENT_AMBULATORY_CARE_PROVIDER_SITE_OTHER): Payer: Medicare Other | Admitting: Ophthalmology

## 2019-12-09 DIAGNOSIS — H35342 Macular cyst, hole, or pseudohole, left eye: Secondary | ICD-10-CM | POA: Diagnosis not present

## 2020-03-14 ENCOUNTER — Encounter (INDEPENDENT_AMBULATORY_CARE_PROVIDER_SITE_OTHER): Payer: Medicare Other | Admitting: Ophthalmology

## 2020-03-14 ENCOUNTER — Other Ambulatory Visit: Payer: Self-pay

## 2020-03-14 DIAGNOSIS — H35033 Hypertensive retinopathy, bilateral: Secondary | ICD-10-CM

## 2020-03-14 DIAGNOSIS — I10 Essential (primary) hypertension: Secondary | ICD-10-CM

## 2020-03-14 DIAGNOSIS — H35342 Macular cyst, hole, or pseudohole, left eye: Secondary | ICD-10-CM | POA: Diagnosis not present

## 2020-03-14 DIAGNOSIS — H43811 Vitreous degeneration, right eye: Secondary | ICD-10-CM

## 2020-04-19 ENCOUNTER — Other Ambulatory Visit: Admission: RE | Admit: 2020-04-19 | Payer: Medicare Other | Source: Ambulatory Visit

## 2020-06-21 ENCOUNTER — Other Ambulatory Visit: Payer: Self-pay

## 2020-06-21 ENCOUNTER — Other Ambulatory Visit
Admission: RE | Admit: 2020-06-21 | Discharge: 2020-06-21 | Disposition: A | Discharge status: Home / Self Care | Payer: Medicare Other | Source: Ambulatory Visit | Attending: Gastroenterology | Admitting: Gastroenterology

## 2020-06-21 DIAGNOSIS — Z20822 Contact with and (suspected) exposure to covid-19: Secondary | ICD-10-CM | POA: Insufficient documentation

## 2020-06-21 DIAGNOSIS — Z01812 Encounter for preprocedural laboratory examination: Secondary | ICD-10-CM | POA: Insufficient documentation

## 2020-06-21 LAB — SARS CORONAVIRUS 2 (TAT 6-24 HRS): SARS Coronavirus 2: NEGATIVE

## 2020-06-25 ENCOUNTER — Encounter: Payer: Self-pay | Admitting: *Deleted

## 2020-06-25 ENCOUNTER — Ambulatory Visit: Payer: Medicare Other | Admitting: Certified Registered Nurse Anesthetist

## 2020-06-25 ENCOUNTER — Encounter
Admission: RE | Disposition: A | Discharge status: Home / Self Care | Payer: Self-pay | Source: Home / Self Care | Attending: Gastroenterology

## 2020-06-25 ENCOUNTER — Other Ambulatory Visit: Payer: Self-pay

## 2020-06-25 ENCOUNTER — Ambulatory Visit
Admission: RE | Admit: 2020-06-25 | Discharge: 2020-06-25 | Disposition: A | Discharge status: Home / Self Care | Payer: Medicare Other | Attending: Gastroenterology | Admitting: Gastroenterology

## 2020-06-25 DIAGNOSIS — Z1211 Encounter for screening for malignant neoplasm of colon: Secondary | ICD-10-CM | POA: Insufficient documentation

## 2020-06-25 DIAGNOSIS — K640 First degree hemorrhoids: Secondary | ICD-10-CM | POA: Insufficient documentation

## 2020-06-25 DIAGNOSIS — Z79899 Other long term (current) drug therapy: Secondary | ICD-10-CM | POA: Diagnosis not present

## 2020-06-25 DIAGNOSIS — K6389 Other specified diseases of intestine: Secondary | ICD-10-CM | POA: Diagnosis not present

## 2020-06-25 DIAGNOSIS — Z85038 Personal history of other malignant neoplasm of large intestine: Secondary | ICD-10-CM | POA: Insufficient documentation

## 2020-06-25 DIAGNOSIS — D123 Benign neoplasm of transverse colon: Secondary | ICD-10-CM | POA: Insufficient documentation

## 2020-06-25 DIAGNOSIS — Z882 Allergy status to sulfonamides status: Secondary | ICD-10-CM | POA: Insufficient documentation

## 2020-06-25 HISTORY — PX: COLONOSCOPY WITH PROPOFOL: SHX5780

## 2020-06-25 SURGERY — COLONOSCOPY WITH PROPOFOL
Anesthesia: General

## 2020-06-25 MED ORDER — PROPOFOL 10 MG/ML IV BOLUS
INTRAVENOUS | Status: DC | PRN
  Administered 2020-06-25: 20 mg via INTRAVENOUS
  Administered 2020-06-25 (×2): 30 mg via INTRAVENOUS
  Administered 2020-06-25: 40 mg via INTRAVENOUS

## 2020-06-25 MED ORDER — PROPOFOL 500 MG/50ML IV EMUL
INTRAVENOUS | Status: DC | PRN
  Administered 2020-06-25: 110 ug/kg/min via INTRAVENOUS

## 2020-06-25 MED ORDER — PHENYLEPHRINE HCL (PRESSORS) 10 MG/ML IV SOLN
INTRAVENOUS | Status: DC | PRN
  Administered 2020-06-25 (×4): 100 ug via INTRAVENOUS

## 2020-06-25 MED ORDER — SODIUM CHLORIDE 0.9 % IV SOLN: INTRAVENOUS | Status: DC

## 2020-06-25 MED ORDER — LIDOCAINE HCL (CARDIAC) PF 100 MG/5ML IV SOSY
PREFILLED_SYRINGE | INTRAVENOUS | Status: DC | PRN
  Administered 2020-06-25: 50 mg via INTRAVENOUS

## 2020-06-25 NOTE — Interval H&P Note (Signed)
History and Physical Interval Note:  06/25/2020 9:36 AM  Timothy Nicholson  has presented today for surgery, with the diagnosis of HX COLON CA.  The various methods of treatment have been discussed with the patient and family. After consideration of risks, benefits and other options for treatment, the patient has consented to  Procedure(s): COLONOSCOPY WITH PROPOFOL (N/A) as a surgical intervention.  The patient's history has been reviewed, patient examined, no change in status, stable for surgery.  I have reviewed the patient's chart and labs.  Questions were answered to the patient's satisfaction.     Lesly Rubenstein  Ok to proceed with colonoscopy

## 2020-06-25 NOTE — Anesthesia Preprocedure Evaluation (Signed)
Anesthesia Evaluation  Patient identified by MRN, date of birth, ID band Patient awake    Reviewed: Allergy & Precautions, H&P , NPO status , Patient's Chart, lab work & pertinent test results  History of Anesthesia Complications Negative for: history of anesthetic complications  Airway Mallampati: III  TM Distance: <3 FB Neck ROM: limited    Dental  (+) Chipped, Missing   Pulmonary neg pulmonary ROS, neg shortness of breath,    Pulmonary exam normal        Cardiovascular Exercise Tolerance: Good hypertension, Normal cardiovascular exam     Neuro/Psych  Headaches, negative psych ROS   GI/Hepatic Neg liver ROS, GERD  Medicated and Controlled,  Endo/Other  negative endocrine ROS  Renal/GU negative Renal ROS  negative genitourinary   Musculoskeletal   Abdominal   Peds  Hematology negative hematology ROS (+)   Anesthesia Other Findings Past Medical History: No date: Arthritis     Comment:  osteo No date: Back pain No date: Benign prostatic hyperplasia with urinary obstruction and  other lower urinary tract symptoms No date: Cancer ([place])     Comment:  maligant polyp No date: Dyslipidemia No date: Elevated PSA No date: Enlarged prostate No date: Family history of breast cancer No date: Family history of colon cancer No date: Family history of lung cancer No date: Family history of pancreatic cancer No date: Family history of prostate cancer No date: GERD (gastroesophageal reflux disease) No date: Headache     Comment:  migraine without aura No date: HOH (hard of hearing) No date: Hypercholesterolemia No date: Hypertension 01/19/2018: Personal history of colon cancer No date: Scoliosis  Past Surgical History: 08/27/2010: COLONOSCOPY     Comment:  Dr. Gustavo Lah Tubular adenomas, FH polyps No date: COLONOSCOPY 11/19/2017: COLONOSCOPY WITH PROPOFOL; N/A     Comment:  Procedure: COLONOSCOPY WITH PROPOFOL;   Surgeon:               Lollie Sails, MD;  Location: [facility] ENDOSCOPY;                Service: Endoscopy;  Laterality: N/A; 04/29/2018: COLONOSCOPY WITH PROPOFOL; N/A     Comment:  Procedure: COLONOSCOPY WITH PROPOFOL;  Surgeon:               Lollie Sails, MD;  Location: [facility] ENDOSCOPY;                Service: Endoscopy;  Laterality: N/A; 04/30/2018: COLONOSCOPY WITH PROPOFOL; N/A     Comment:  Procedure: COLONOSCOPY WITH PROPOFOL;  Surgeon:               Lollie Sails, MD;  Location: [facility] ENDOSCOPY;                Service: Endoscopy;  Laterality: N/A; 05/31/2018: COLONOSCOPY WITH PROPOFOL; N/A     Comment:  Procedure: COLONOSCOPY WITH PROPOFOL;  Surgeon:               Lollie Sails, MD;  Location: [facility] ENDOSCOPY;                Service: Endoscopy;  Laterality: N/A; 09/17/2018: COLONOSCOPY WITH PROPOFOL; N/A     Comment:  Procedure: COLONOSCOPY WITH PROPOFOL;  Surgeon:               Lollie Sails, MD;  Location: [facility] ENDOSCOPY;                Service: Endoscopy;  Laterality: N/A; 11/19/2017: ESOPHAGOGASTRODUODENOSCOPY (EGD)  WITH PROPOFOL; N/A     Comment:  Procedure: ESOPHAGOGASTRODUODENOSCOPY (EGD) WITH               PROPOFOL;  Surgeon: Lollie Sails, MD;  Location:               [facility] ENDOSCOPY;  Service: Endoscopy;  Laterality: N/A; 09/20/2019: GAS INSERTION; Left     Comment:  Procedure: INSERTION OF GAS;  Surgeon: Hayden Pedro,              MD;  Location: [place];  Service: Ophthalmology;                Laterality: Left; 09/20/2019: LASER PHOTO ABLATION; Left     Comment:  Procedure: Laser Photo Ablation;  Surgeon: Hayden Pedro, MD;  Location: [place];  Service: Ophthalmology;                Laterality: Left; 09/20/2019: MEMBRANE PEEL; Left     Comment:  Procedure: Antoine Primas;  Surgeon: Hayden Pedro,               MD;  Location: [place];  Service: Ophthalmology;                Laterality: Left; 09/20/2019: PARS PLANA VITRECTOMY W/ SCLERAL  BUCKLE; Left     Comment:  Procedure: 25 Gauge PARS PLANA VITRECTOMY WITH LASER FOR              MACULAR HOLE;  Surgeon: Hayden Pedro, MD;  Location:               [place];  Service: Ophthalmology;  Laterality: Left; 09/20/2019: SERUM PATCH; Left     Comment:  Procedure: SERUM PATCH;  Surgeon: Hayden Pedro, MD;               Location: [place];  Service: Ophthalmology;  Laterality:               Left; No date: TONSILLECTOMY 09/20/2019: VITRECTOMY; Left     Comment:   25 Gauge PARS PLANA VITRECTOMY WITH LASER FOR MACULAR               HOLE (Left Eye)     Reproductive/Obstetrics negative OB ROS                             Anesthesia Physical Anesthesia Plan  ASA: III  Anesthesia Plan: General   Post-op Pain Management:    Induction: Intravenous  PONV Risk Score and Plan: Propofol infusion and TIVA  Airway Management Planned: Natural Airway and Nasal Cannula  Additional Equipment:   Intra-op Plan:   Post-operative Plan:   Informed Consent: I have reviewed the patients History and Physical, chart, labs and discussed the procedure including the risks, benefits and alternatives for the proposed anesthesia with the patient or authorized representative who has indicated his/her understanding and acceptance.     Dental Advisory Given  Plan Discussed with: Anesthesiologist, CRNA and Surgeon  Anesthesia Plan Comments: (Patient consented for risks of anesthesia including but not limited to:  - adverse reactions to medications - risk of airway placement if required - damage to eyes, teeth, lips or other oral mucosa - nerve damage due to positioning  - sore throat or hoarseness - Damage to heart, brain, nerves, lungs, other parts of body or  loss of life  Patient voiced understanding.)        Anesthesia Quick Evaluation

## 2020-06-25 NOTE — H&P (Signed)
Outpatient short stay form Pre-procedure 06/25/2020 9:33 AM Raylene Miyamoto MD, MPH  Primary Physician: Dr. 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 No family history of GI malignancies. No blood thinners. No new symptoms.    Current Facility-Administered Medications:  .  0.9 %  sodium chloride infusion, , Intravenous, Continuous, Rochella Benner, Hilton Cork, MD, Last Rate: 20 mL/hr at 06/25/20 0907, New Bag at 06/25/20 0907  Medications Prior to Admission  Medication Sig Dispense Refill Last Dose  . finasteride (PROSCAR) 5 MG tablet Take 5 mg by mouth daily.   06/24/2020 at Unknown time  . lisinopril-hydrochlorothiazide (PRINZIDE,ZESTORETIC) 10-12.5 MG tablet Take 1 tablet by mouth daily.   06/24/2020 at Unknown time  . pantoprazole (PROTONIX) 40 MG tablet Take 40 mg by mouth daily.   06/24/2020 at Unknown time  . tamsulosin (FLOMAX) 0.4 MG CAPS capsule Take 0.4 mg by mouth daily.    06/24/2020 at Unknown time  . acetaminophen (TYLENOL) 500 MG tablet Take 500 mg by mouth every 6 (six) hours as needed for mild pain or moderate pain.     . bacitracin-polymyxin b (POLYSPORIN) ophthalmic ointment Place 1 application into the left eye 3 (three) times daily. apply to eye every 12 hours while awake 3.5 g 0   . gatifloxacin (ZYMAXID) 0.5 % SOLN Place 1 drop into the left eye 4 (four) times daily.     . Glucosamine HCl 1500 MG TABS Take 1,500 mg by mouth daily. turmeric     . Multiple Vitamins-Minerals (MULTIVITAMIN WITH MINERALS) tablet Take 1 tablet by mouth daily.     . prednisoLONE acetate (PRED FORTE) 1 % ophthalmic suspension Place 1 drop into the left eye 4 (four) times daily. 5 mL 0   . rosuvastatin (CRESTOR) 10 MG tablet Take 10 mg by mouth daily.        Allergies  Allergen Reactions  . Sulfa Antibiotics  Rash     Past Medical History:  Diagnosis Date  . Arthritis    osteo  . Back pain   . Benign prostatic hyperplasia with urinary obstruction and other lower urinary tract symptoms   . Cancer (HCC)    maligant polyp  . Dyslipidemia   . Elevated PSA   . Enlarged prostate   . Family history of breast cancer   . Family history of colon cancer   . Family history of lung cancer   . Family history of pancreatic cancer   . Family history of prostate cancer   . GERD (gastroesophageal reflux disease)   . Headache    migraine without aura  . HOH (hard of hearing)   . Hypercholesterolemia   . Hypertension   . Personal history of colon cancer 01/19/2018  . Scoliosis     Review of systems:  Otherwise negative.    Physical Exam  Gen: Alert, oriented. Appears stated age.  HEENT: PERRLA. Lungs: No respiratory distress CV: RRR Abd: soft, benign, no masses Ext: No edema    Planned procedures: Proceed with colonoscopy. The patient understands the nature of the planned procedure, indications, risks, alternatives and potential complications including but not limited to bleeding, infection, perforation, damage to internal organs and possible oversedation/side effects from anesthesia. The patient agrees and gives consent to proceed.  Please refer to procedure notes for findings, recommendations and patient disposition/instructions.  Raylene Miyamoto MD, MPH Gastroenterology 06/25/2020  9:33 AM

## 2020-06-25 NOTE — Op Note (Signed)
[facility] Gastroenterology Patient Name: Timothy Nicholson Procedure Date: 06/25/2020 9:28 AM MRN: [id number] Account #: [id number] Date of Birth: 09-26-1950 Admit Type: Outpatient Age: 70 Room: [facility] ENDO ROOM 3 Gender: Male Note Status: Finalized Procedure:             Colonoscopy Indications:           High risk colon cancer surveillance: Personal history                         of colon cancer Providers:             Andrey Farmer MD, MD Referring MD:          Ocie Cornfield. Ouida Sills MD, MD (Referring MD) Medicines:             Monitored Anesthesia Care Complications:         No immediate complications. Estimated blood loss:                         Minimal. Procedure:             Pre-Anesthesia Assessment:                        - Prior to the procedure, a History and Physical was                         performed, and patient medications and allergies were                         reviewed. The patient is competent. The risks and                         benefits of the procedure and the sedation options and                         risks were discussed with the patient. All questions                         were answered and informed consent was obtained.                         Patient identification and proposed procedure were                         verified by the physician, the nurse, the anesthetist                         and the technician in the endoscopy suite. Mental                         Status Examination: alert and oriented. Airway                         Examination: normal oropharyngeal airway and neck                         mobility. Respiratory Examination: clear to  auscultation. CV Examination: normal. Prophylactic                         Antibiotics: The patient does not require prophylactic                         antibiotics. Prior Anticoagulants: The patient has                         taken no previous anticoagulant  or antiplatelet                         agents. ASA Grade Assessment: II - A patient with mild                         systemic disease. After reviewing the risks and                         benefits, the patient was deemed in satisfactory                         condition to undergo the procedure. The anesthesia                         plan was to use monitored anesthesia care (MAC).                         Immediately prior to administration of medications,                         the patient was re-assessed for adequacy to receive                         sedatives. The heart rate, respiratory rate, oxygen                         saturations, blood pressure, adequacy of pulmonary                         ventilation, and response to care were monitored                         throughout the procedure. The physical status of the                         patient was re-assessed after the procedure.                        After obtaining informed consent, the colonoscope was                         passed under direct vision. Throughout the procedure,                         the patient's blood pressure, pulse, and oxygen                         saturations were monitored continuously. The  Colonoscope was introduced through the anus and                         advanced to the the cecum, identified by appendiceal                         orifice and ileocecal valve. The colonoscopy was                         performed without difficulty. The patient tolerated                         the procedure well. The quality of the bowel                         preparation was good. Findings:      The perianal and digital rectal examinations were normal.      A localized area of granular mucosa was found at the ileocecal valve.       The valve had an almost adenomatous appearance. Biopsies were taken with       a cold forceps for histology. Estimated blood loss was minimal.       A 2 mm polyp was found in the hepatic flexure. The polyp was sessile.       The polyp was removed with a jumbo cold forceps. Resection and retrieval       were complete. Estimated blood loss was minimal.      A tattoo was seen in the transverse colon. The tattoo site appeared       normal.      Internal hemorrhoids were found during retroflexion. The hemorrhoids       were Grade I (internal hemorrhoids that do not prolapse).      The exam was otherwise without abnormality on direct and retroflexion       views. Impression:            - Granularity at the ileocecal valve. Biopsied.                        - One 2 mm polyp at the hepatic flexure, removed with                         a jumbo cold forceps. Resected and retrieved.                        - A tattoo was seen in the transverse colon. The                         tattoo site appeared normal.                        - Internal hemorrhoids.                        - The examination was otherwise normal on direct and                         retroflexion views. Recommendation:        - Discharge patient to home.                        -  Resume previous diet.                        - Continue present medications.                        - Await pathology results.                        - Repeat colonoscopy date to be determined after                         pending pathology results are reviewed for                         surveillance. Would go no longer than three years.                        - Return to referring physician as previously                         scheduled. Procedure Code(s):     --- Professional ---                        [phone], Colonoscopy, flexible; with biopsy, single or                         multiple Diagnosis Code(s):     --- Professional ---                        [phone], Personal history of other malignant neoplasm                         of large intestine                        K63.89, Other specified  diseases of intestine                        K63.5, Polyp of colon                        K64.0, First degree hemorrhoids CPT copyright 2019 [employer] Association. All rights reserved. The codes documented in this report are preliminary and upon coder review Claus  be revised to meet current compliance requirements. Andrey Farmer MD, MD 06/25/2020 10:09:45 AM Number of Addenda: 0 Note Initiated On: 06/25/2020 9:28 AM Scope Withdrawal Time: 0 hours 11 minutes 21 seconds  Total Procedure Duration: 0 hours 17 minutes 45 seconds  Estimated Blood Loss:  Estimated blood loss was minimal.      [facility]

## 2020-06-25 NOTE — Anesthesia Postprocedure Evaluation (Signed)
Anesthesia Post Note  Patient: Timothy Nicholson  Procedure(s) Performed: COLONOSCOPY WITH PROPOFOL (N/A )  Patient location during evaluation: Endoscopy Anesthesia Type: General Level of consciousness: awake and alert Pain management: pain level controlled Vital Signs Assessment: post-procedure vital signs reviewed and stable Respiratory status: spontaneous breathing, nonlabored ventilation, respiratory function stable and patient connected to nasal cannula oxygen Cardiovascular status: blood pressure returned to baseline and stable Postop Assessment: no apparent nausea or vomiting Anesthetic complications: no   No complications documented.   Last Vitals:  Vitals:   06/25/20 1027 06/25/20 1037  BP: 113/71 118/72  Pulse: 78 76  Resp: 13 20  Temp:    SpO2: 98% 98%    Last Pain:  Vitals:   06/25/20 1037  TempSrc:   PainSc: 0-No pain                 Precious Haws Jeanpierre Thebeau

## 2020-06-25 NOTE — Transfer of Care (Signed)
Immediate Anesthesia Transfer of Care Note  Patient: Timothy Nicholson  Procedure(s) Performed: COLONOSCOPY WITH PROPOFOL (N/A )  Patient Location: PACU  Anesthesia Type:General  Level of Consciousness: awake, alert  and oriented  Airway & Oxygen Therapy: Patient Spontanous Breathing and Patient connected to nasal cannula oxygen  Post-op Assessment: Report given to RN and Post -op Vital signs reviewed and stable  Post vital signs: Reviewed and stable  Last Vitals:  Vitals Value Taken Time  BP 87/49 06/25/20 1008  Temp    Pulse 80 06/25/20 1008  Resp 12 06/25/20 1008  SpO2 100 % 06/25/20 1008  Vitals shown include unvalidated device data.  Last Pain:  Vitals:   06/25/20 0839  TempSrc: Temporal  PainSc: 0-No pain         Complications: No complications documented.

## 2020-06-26 ENCOUNTER — Encounter: Payer: Self-pay | Admitting: Gastroenterology

## 2020-06-26 LAB — SURGICAL PATHOLOGY

## 2020-09-12 ENCOUNTER — Encounter (INDEPENDENT_AMBULATORY_CARE_PROVIDER_SITE_OTHER): Payer: Medicare Other | Admitting: Ophthalmology

## 2020-09-24 ENCOUNTER — Encounter (INDEPENDENT_AMBULATORY_CARE_PROVIDER_SITE_OTHER): Payer: Medicare Other | Admitting: Ophthalmology

## 2020-10-22 ENCOUNTER — Encounter (INDEPENDENT_AMBULATORY_CARE_PROVIDER_SITE_OTHER): Payer: Medicare Other | Admitting: Ophthalmology

## 2021-07-08 ENCOUNTER — Ambulatory Visit: Payer: Medicare Other | Admitting: Dermatology

## 2021-07-29 ENCOUNTER — Ambulatory Visit: Payer: Medicare Other | Admitting: Dermatology

## 2021-10-29 ENCOUNTER — Other Ambulatory Visit: Payer: Self-pay | Admitting: Internal Medicine

## 2021-10-29 DIAGNOSIS — R1909 Other intra-abdominal and pelvic swelling, mass and lump: Secondary | ICD-10-CM

## 2021-10-31 ENCOUNTER — Ambulatory Visit
Admission: RE | Admit: 2021-10-31 | Discharge: 2021-10-31 | Disposition: A | Discharge status: Home / Self Care | Payer: Medicare Other | Source: Ambulatory Visit | Attending: Internal Medicine | Admitting: Internal Medicine

## 2021-10-31 DIAGNOSIS — R1909 Other intra-abdominal and pelvic swelling, mass and lump: Secondary | ICD-10-CM

## 2022-01-01 ENCOUNTER — Ambulatory Visit: Payer: Medicare Other | Admitting: Dermatology

## 2022-01-02 DIAGNOSIS — R7303 Prediabetes: Secondary | ICD-10-CM | POA: Insufficient documentation

## 2022-07-16 NOTE — Progress Notes (Signed)
 MEDICARE WELLNESS VISIT   PROVIDERS RENDERING CARE Dr. Lenon, [facility] optho, Timothy Nicholson   FUNCTIONAL ASSESSMENT  (1) Hearing: Demonstrates normal hearing in conversation.  (2) Risk of Falls: No reports of falls or abnormal balance. Gait is observed to be good upon observation.  (3) Home Safety; Home is safe and secure (4) Activities of Daily Living; Household chores and grooming are managed without problems. Personal finances are managed without problems.    DEPRESSION SCREENING There does not seem to be loss of interest in activities nor excess crying or changes in sleep or appetite.    COGNITIVE SCREENING Orientation is appropriate as are responses to questions and general conversation. No reports of forgetfulness or losing things.      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   [name] MD    [facility] Nich[facility] 2/9 last 3 flowsheet values     06/19/2020    8:21 AM 07/02/2021    8:35 AM 07/16/2022    8:33 AM  PHQ-2/9 Depression Screening   Little interest or pleasure in doing things   0  Feeling down, depressed, or hopeless   0  Patient Health Questionnaire-2 Score   0  (OBSOLETE) Little interest or pleasure in doing things 0 0   (OBSOLETE) Feeling down, depressed, or hopeless (or irritable for Teens only)? 0 0   (OBSOLETE) Total Prescreening Score 0 0   (OBSOLETE) Total Score = 0 0      Depression Severity and  Treatment Recommendations:  0-4= None  5-9= Mild / Treatment: Support, educate to call if worse; return in one month  10-14= Moderate / Treatment: Support, watchful waiting; Antidepressant or Psychotherapy  15-19= Moderately severe / Treatment: Antidepressant OR Psychotherapy  >= 20 = Major depression, severe / Antidepressant AND Psychotherapy  Please note approximately 15 minutes was spent and depression screening by me and nursing staff.     Timothy Nicholson is a 72 y.o. male here for follow up of their medical problems  CHIEF COMPLAINT:  Follow up medical problems in the problem list and as discussed in the history and assessment areas as well as new complaints as listed.   Patient Active Problem List  Diagnosis  . Benign hypertension with CKD (chronic kidney disease) stage III (CMS/HHS-HCC)  . Benign nodular prostatic hyperplasia without lower urinary tract symptoms  . Chronic migraine without aura  . Pure hypercholesterolemia  . GERD without esophagitis  . Elevated prostate specific antigen (PSA)  . Health care maintenance  . Prediabetes     HISTORY OF PRESENT ILLNESS:  Benign hypertension with CKD (chronic kidney disease) stage III (CMS-HCC) Taking medications without noted side effects or dizziness.  Uremic symptoms such as nausea and itching are not noted and nephrotoxic medications are being avoided.    Prediabetes Glucose is followed and controlled   Pure hypercholesterolemia Healthy fat diet is being followed and no myalgia's are noted.   Past Medical History:  Diagnosis Date  .  Chronic back pain   . Decreased hearing   . Dyslipidemia   . GERD (gastroesophageal reflux disease)   . Hyperlipidemia   . Hypertension   . Osteoarthritis     Past Surgical History:  Procedure Laterality Date  . COLONOSCOPY  08/27/2010   Dr. EMERSON Nicholson @ [facility] - Tubular Adenomas, FHPolyps(f), rpt 5 yrs per [facility], ltr mailed  . COLONOSCOPY  11/19/2017   Invasive adenocarcinoma  arising in a pedunculated adenoma/Repeat 4-6 months/[facility]  . EGD  11/19/2017   Gastritis/No Repeat/[facility]  . COLONOSCOPY  04/29/2018   Serrated adenoma/Hyperplastic colon polyp/At least intramucosal carcinoma/Repeat 05/31/2018/[facility]  . COLONOSCOPY  05/31/2018   Tubular adenoma of the colon/Repeat 3 to 4 months/M[facility]  . COLONOSCOPY  09/17/2018   Tubular adenoma of the colon/Hyperplastic colon polyp/Repeat 68yr/MUS  . COLONOSCOPY  06/25/2020   Tubular adenoma/PHx CC/Repeat 89yrs/CTL  . COLONOSCOPY    . TONSILLECTOMY       No fever chills or sweats   No nausea, vomiting or diarrhea  No chest pain, shortness of breath   Social History   Socioeconomic History  . Marital status: Married  Tobacco Use  . Smoking status: Never  . Smokeless tobacco: Never  Vaping Use  . Vaping Use: Never used  Substance and Sexual Activity  . Alcohol use: No  . Drug use: No  . Sexual activity: Defer   Social Determinants of Health   Financial Resource Strain: Low Risk  (07/16/2022)   Overall Financial Resource Strain (CARDIA)   . Difficulty of Paying Living Expenses: Not hard at all  Food Insecurity: No Food Insecurity (07/16/2022)   Hunger Vital Sign   . Worried About Programme Researcher, Broadcasting/film/video in the Last Year: Never true   . Ran Out of Food in the Last Year: Never true  Transportation Needs: No Transportation Needs (07/16/2022)   PRAPARE - Transportation   . Lack of Transportation (Medical): No   . Lack of Transportation (Non-Medical): No  Housing Stability: Unknown (07/16/2022)   Housing Stability Vital Sign   . Unable to Pay for Housing in the Last Year: No   . Unstable Housing in the Last Year: No      Current Outpatient Medications:  .  acetaminophen  (TYLENOL ) 500 MG tablet, Take 1 tablet by mouth every 6 (six) hours as needed, Disp: , Rfl:  .  fexofenadine (ALLEGRA) 180 MG tablet, Take 180 mg by mouth once daily as needed, Disp: , Rfl:  .  finasteride  (PROSCAR ) 5 mg tablet, Take 5 mg by mouth once daily.,  Disp: , Rfl:  .  lisinopriL -hydroCHLOROthiazide  (ZESTORETIC ) 10-12.5 mg tablet, Take 1 tablet by mouth once daily, Disp: 90 tablet, Rfl: 1 .  multivitamin with minerals tablet, Take 1 tablet by mouth once daily, Disp: , Rfl:  .  pantoprazole  (PROTONIX ) 40 MG DR tablet, Take 1 tablet (40 mg total) by mouth once daily, Disp: 90 tablet, Rfl: 1 .  rosuvastatin  (CRESTOR ) 10 MG tablet, Take 1 tablet (10 mg total) by mouth once daily, Disp: 90 tablet, Rfl: 1 .  tamsulosin (FLOMAX) 0.4 mg capsule, Take 1 capsule (0.4 mg total) by mouth once daily Take 30 minutes after same meal each day., Disp: 90 capsule, Rfl: 1 .  triamcinolone  (NASACORT  AQ) 55 mcg nasal spray, Place 2 sprays into both nostrils once daily, Disp: , Rfl:  .  fluticasone propionate (FLONASE) 50 mcg/actuation nasal spray, Place 2 sprays into both nostrils once daily as needed for Rhinitis (Patient not taking: Reported on  07/16/2022), Disp: , Rfl:   Vitals:   07/16/22 0832  BP: 112/68  Pulse: 68  Resp: 16   Body mass index is 26.07 kg/m. No acute distress Lungs; clear to ascultation Heart; Regular rate and rhythm  Abdomen; Soft and flat, normal bowel sounds Extremities; No clubbing, cyanosis or edema  Appointment on 07/09/2022  Component Date Value Ref Range Status  . Glucose 07/09/2022 97  70 - 110 mg/dL Final  . Sodium 96/72/7975 140  136 - 145 mmol/L Final  . Potassium 07/09/2022 3.8  3.6 - 5.1 mmol/L Final  . Chloride 07/09/2022 105  97 - 109 mmol/L Final  . Carbon Dioxide (CO2) 07/09/2022 31.1  22.0 - 32.0 mmol/L Final  . Urea Nitrogen (BUN) 07/09/2022 28 (H)  7 - 25 mg/dL Final  . Creatinine 96/72/7975 1.2  0.7 - 1.3 mg/dL Final  . Glomerular Filtration Rate (eGFR) 07/09/2022 65  >60 mL/min/1.73sq m Final  . Calcium  07/09/2022 9.2  8.7 - 10.3 mg/dL Final  . AST  96/72/7975 18  8 - 39 U/L Final  . ALT  07/09/2022 17  6 - 57 U/L Final  . Alk Phos (alkaline Phosphatase) 07/09/2022 38  34 - 104 U/L Final  . Albumin  07/09/2022 4.3  3.5 - 4.8 g/dL Final  . Bilirubin, Total 07/09/2022 0.6  0.3 - 1.2 mg/dL Final  . Protein, Total 07/09/2022 6.4  6.1 - 7.9 g/dL Final  . A/G Ratio 96/72/7975 2.0  1.0 - 5.0 gm/dL Final  . Hemoglobin J8R 07/09/2022 5.9 (H)  4.2 - 5.6 % Final  . Average Blood Glucose (Calc) 07/09/2022 123  mg/dL Final  . Cholesterol, Total 07/09/2022 168  100 - 200 mg/dL Final  . Triglyceride 96/72/7975 94  35 - 199 mg/dL Final  . HDL (High Density Lipoprotein) Cho* 07/09/2022 56.7  29.0 - 71.0 mg/dL Final  . LDL Calculated 07/09/2022 93  0 - 130 mg/dL Final  . VLDL Cholesterol 07/09/2022 19  mg/dL Final  . Cholesterol/HDL Ratio 07/09/2022 3.0   Final    ASSESSMENT  AND PLAN:  Diagnoses and all orders for this visit:  Benign hypertension with CKD (chronic kidney disease) stage III (CMS/HHS-HCC) Assessment & Plan: Taking medications without noted side effects or dizziness.  Uremic symptoms such as nausea and itching are not noted and nephrotoxic medications are being avoided.     Routine general medical examination at a health care facility  Prediabetes Assessment & Plan: Glucose is followed and controlled   Orders: -     Comprehensive Metabolic Panel (CMP); Future -     Hemoglobin A1C; Future -     Lipid Panel w/calc LDL; Future -     PSA, Total (Screen); Future  Pure hypercholesterolemia Assessment & Plan: Healthy fat diet is being followed and no myalgia's are noted.   Orders: -     Comprehensive Metabolic Panel (CMP); Future -     Hemoglobin A1C; Future -     Lipid Panel w/calc LDL; Future -     PSA, Total (Screen); Future  Right inguinal pain -     Ambulatory Referral to General Surgery -     X-ray hip right 2 or 3 views with or without pelvis; Future  Encounter for prostate cancer screening -     Comprehensive Metabolic Panel (CMP); Future -     Hemoglobin A1C; Future -     Lipid Panel w/calc LDL; Future -     PSA, Total (Screen); Future

## 2022-07-21 ENCOUNTER — Other Ambulatory Visit: Payer: Self-pay | Admitting: Surgery

## 2022-07-21 DIAGNOSIS — R1031 Right lower quadrant pain: Secondary | ICD-10-CM

## 2022-07-25 ENCOUNTER — Ambulatory Visit
Admission: RE | Admit: 2022-07-25 | Discharge: 2022-07-25 | Disposition: A | Discharge status: Home / Self Care | Payer: Medicare Other | Source: Ambulatory Visit | Attending: Surgery | Admitting: Surgery

## 2022-07-25 DIAGNOSIS — R1031 Right lower quadrant pain: Secondary | ICD-10-CM

## 2022-07-25 MED ORDER — IOHEXOL 300 MG/ML  SOLN
100.0000 mL | Freq: Once | INTRAMUSCULAR | Status: AC | PRN
  Administered 2022-07-25: 100 mL via INTRAVENOUS

## 2022-07-25 MED ORDER — BARIUM SULFATE 2 % PO SUSP
900.0000 mL | Freq: Once | ORAL | Status: AC
  Administered 2022-07-25: 900 mL via ORAL

## 2023-07-02 NOTE — Progress Notes (Signed)
 CC: Chief Complaint  Patient presents with  . Foot Problem    Routine foot care .     Timothy Nicholson presents for a complaint of painful toenails and all of his toes causing some pain and pressure in his shoes when walking.  Patient is prediabetic.  Denies any numbness or paresthesias in the feet.   Objective: The skin is warm dry and supple with some diminished hair growth.  All 10 toenails are thick, dystrophic, discolored, brittle with subungual debris.  DP and PT pulses are palpable bilateral.  Protective threshold with monofilament wire intact and symmetric to the forefoot and digits.  Chart review: Prior labs reviewed today by myself.  Most recent hemoglobin A1c from September 2024 minimally elevated at 5.8, stable from 5.21 June 2022.    Assessment: Encounter Diagnoses  Name Primary?  . Mycotic toenails Yes  . Pain in toes of both feet   . Prediabetes     Plan: Debrided all 10 toenails in length and thickness sharply using toenail nippers.  Discussed proper diabetic footcare including close observation of the feet for signs or symptoms of any sores or infections.  Discussed importance of keeping his blood sugars under good control.  Patient will return to clinic as needed at this point as his feet generally appear to be in pretty good shape as far as his diabetes is concerned.  No orders of the defined types were placed in this encounter.

## 2023-09-24 ENCOUNTER — Encounter (INDEPENDENT_AMBULATORY_CARE_PROVIDER_SITE_OTHER): Admitting: Ophthalmology

## 2023-09-24 DIAGNOSIS — I10 Essential (primary) hypertension: Secondary | ICD-10-CM | POA: Diagnosis not present

## 2023-09-24 DIAGNOSIS — H4321 Crystalline deposits in vitreous body, right eye: Secondary | ICD-10-CM

## 2023-09-24 DIAGNOSIS — H35342 Macular cyst, hole, or pseudohole, left eye: Secondary | ICD-10-CM

## 2023-09-24 DIAGNOSIS — H35033 Hypertensive retinopathy, bilateral: Secondary | ICD-10-CM | POA: Diagnosis not present

## 2023-12-07 NOTE — Progress Notes (Signed)
 Subjective: Pt. returns with painful toenails on all toes causing pressure in the shoes when walking.  Objective: All of the toenails are thick, dystrophic, discolored, and brittle with subungual debris.  Impression: Painful onychomycosis all toes.  Plan: Debridement of all toenails in length and thickness sharply using toenail nippers.  Pt will return PRN.

## 2024-02-04 NOTE — Progress Notes (Signed)
 Timothy Nicholson is a 73 y.o. male here for follow up of their medical problems  CHIEF COMPLAINT:  Follow up medical problems in the problem list and as discussed in the history and assessment areas as well as new complaints as listed.   Patient Active Problem List  Diagnosis  . Benign hypertension with CKD (chronic kidney disease) stage III (CMS-HCC)  . Benign nodular prostatic hyperplasia without lower urinary tract symptoms  . Chronic migraine without aura  . Pure hypercholesterolemia  . GERD without esophagitis  . Elevated prostate specific antigen (PSA)  . Health care maintenance  . Prediabetes     HISTORY OF PRESENT ILLNESS:  Pure hypercholesterolemia Healthy fat diet is being followed and no myalgia's are noted.   Prediabetes Glucose is controlled and followed  Benign hypertension with CKD (chronic kidney disease) stage III (CMS/HHS-HCC) Taking medications without noted side effects or dizziness.  Uremic symptoms such as nausea and itching are not noted and nephrotoxic medications are being avoided.    Past Medical History:  Diagnosis Date  . Chronic back pain   . Decreased hearing   . Dyslipidemia   . GERD (gastroesophageal reflux disease)   . Hyperlipidemia   . Hypertension   . Osteoarthritis     Past Surgical History:  Procedure Laterality Date  . COLONOSCOPY  08/27/2010   Dr. EMERSON Mariner @ [facility] - Tubular Adenomas, FHPolyps(f), rpt 5 yrs per [facility], ltr mailed  . 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 EGD  11/19/2017   Gastritis/No Repeat/MUS  . COLONOSCOPY  04/29/2018   Serrated adenoma/Hyperplastic colon polyp/At least intramucosal carcinoma/Repeat 05/31/2018/MUS  . COLONOSCOPY  05/31/2018   Tubular adenoma of the colon/Repeat 3 to 4 months/MUS  . COLONOSCOPY  09/17/2018   Tubular adenoma of the colon/Hyperplastic colon polyp/Repeat 39yr/MUS  . COLONOSCOPY  06/25/2020   Tubular adenoma/PHx  CC/Repeat 42yrs/CTL  . CATARACT EXTRACTION    . COLONOSCOPY    . TONSILLECTOMY       No fever chills or sweats   No nausea, vomiting or diarrhea  No chest pain, shortness of breath   Social History   Socioeconomic History  . Marital status: Married  Tobacco Use  . Smoking status: Never  . Smokeless tobacco: Never  Vaping Use  . Vaping status: Never Used  Substance and Sexual Activity  . Alcohol use: No  . Drug use: No  . Sexual activity: Defer   Social Drivers of Health   Financial Resource Strain: Low Risk  (08/04/2023)   Overall Financial Resource Strain (CARDIA)   . Difficulty of Paying Living Expenses: Not hard at all  Food Insecurity: No Food Insecurity (08/04/2023)   Hunger Vital Sign   . Worried About Programme Researcher, Broadcasting/film/video in the Last Year: Never true   . Ran Out of Food in the Last Year: Never true  Transportation Needs: No Transportation Needs (08/04/2023)   PRAPARE - Transportation   . Lack of Transportation (Medical): No   . Lack of Transportation (Non-Medical): No  Housing Stability: Unknown (08/04/2023)   Housing Stability Vital Sign   . Unable to Pay for Housing in the Last Year: No   . Homeless in the Last Year: No      Current Outpatient Medications:  .  acetaminophen  (TYLENOL ) 500 MG tablet, Take 1 tablet by mouth every 6 (six) hours as needed, Disp: , Rfl:  .  fexofenadine (ALLEGRA) 180 MG tablet, Take 180 mg  by mouth once daily as needed, Disp: , Rfl:  .  finasteride  (PROSCAR ) 5 mg tablet, Take 5 mg by mouth once daily., Disp: , Rfl:  .  lisinopriL -hydroCHLOROthiazide  (ZESTORETIC ) 10-12.5 mg tablet, TAKE 1 TABLET BY MOUTH EVERY DAY, Disp: 90 tablet, Rfl: 1 .  multivitamin with minerals tablet, Take 1 tablet by mouth once daily, Disp: , Rfl:  .  pantoprazole  (PROTONIX ) 40 MG DR tablet, TAKE 1 TABLET BY MOUTH EVERY DAY, Disp: 90 tablet, Rfl: 3 .  rosuvastatin  (CRESTOR ) 10 MG tablet, TAKE 1 TABLET BY MOUTH EVERY DAY, Disp: 90 tablet, Rfl: 3 .  tamsulosin  (FLOMAX) 0.4 mg capsule, Take 1 capsule (0.4 mg total) by mouth once daily Take 30 minutes after same meal each day., Disp: 90 capsule, Rfl: 1 .  triamcinolone  (NASACORT  AQ) 55 mcg nasal spray, Place 2 sprays into both nostrils once daily, Disp: , Rfl:   Vitals:   02/04/24 0803  BP: 117/72  Pulse: 63   Body mass index is 25.25 kg/m. No acute distress Lungs; clear to ascultation Heart; Regular rate and rhythm  Abdomen; Soft and flat, normal bowel sounds Extremities; No clubbing, cyanosis or edema  Appointment on 01/28/2024  Component Date Value Ref Range Status  . Glucose 01/28/2024 96  70 - 110 mg/dL Final  . Sodium 89/83/7974 141  136 - 145 mmol/L Final  . Potassium 01/28/2024 4.0  3.6 - 5.1 mmol/L Final  . Chloride 01/28/2024 104  97 - 109 mmol/L Final  . Carbon Dioxide (CO2) 01/28/2024 32.1 (H)  22.0 - 32.0 mmol/L Final  . Urea Nitrogen (BUN) 01/28/2024 26 (H)  7 - 25 mg/dL Final  . Creatinine 89/83/7974 1.3  0.7 - 1.3 mg/dL Final  . Glomerular Filtration Rate (eGFR) 01/28/2024 58 (L)  >60 mL/min/1.73sq m Final  . Calcium  01/28/2024 9.2  8.7 - 10.3 mg/dL Final  . AST  89/83/7974 17  8 - 39 U/L Final  . ALT  01/28/2024 16  6 - 57 U/L Final  . Alk Phos (alkaline Phosphatase) 01/28/2024 37  34 - 104 U/L Final  . Albumin 01/28/2024 4.3  3.5 - 4.8 g/dL Final  . Bilirubin, Total 01/28/2024 0.6  0.3 - 1.2 mg/dL Final  . Protein, Total 01/28/2024 6.4  6.1 - 7.9 g/dL Final  . A/G Ratio 89/83/7974 2.0  1.0 - 5.0 gm/dL Final  . Hemoglobin J8R 01/28/2024 6.1 (H)  4.2 - 5.6 % Final  . Average Blood Glucose (Calc) 01/28/2024 128  mg/dL Final  . Cholesterol, Total 01/28/2024 198  100 - 200 mg/dL Final  . Triglyceride 89/83/7974 114  35 - 199 mg/dL Final  . HDL (High Density Lipoprotein) Cho* 01/28/2024 58.3  29.0 - 71.0 mg/dL Final  . LDL Calculated 01/28/2024 882  0 - 130 mg/dL Final  . VLDL Cholesterol 01/28/2024 23  mg/dL Final  . Cholesterol/HDL Ratio 01/28/2024 3.4   Final     ASSESSMENT  AND PLAN:  Diagnoses and all orders for this visit:  Benign hypertension with CKD (chronic kidney disease) stage III (CMS-HCC) Assessment & Plan: Taking medications without noted side effects or dizziness.  Uremic symptoms such as nausea and itching are not noted and nephrotoxic medications are being avoided.    Orders: -     Comprehensive Metabolic Panel (CMP); Future -     Hemoglobin A1C; Future -     Lipid Panel w/calc LDL; Future -     PSA, Total (Screen); Future  Prediabetes Assessment & Plan: Glucose is  controlled and followed  Orders: -     Comprehensive Metabolic Panel (CMP); Future -     Hemoglobin A1C; Future -     Lipid Panel w/calc LDL; Future -     PSA, Total (Screen); Future  Pure hypercholesterolemia Assessment & Plan: Healthy fat diet is being followed and no myalgia's are noted.   Orders: -     Comprehensive Metabolic Panel (CMP); Future -     Hemoglobin A1C; Future -     Lipid Panel w/calc LDL; Future -     PSA, Total (Screen); Future  Encounter for prostate cancer screening -     PSA, Total (Screen); Future

## 2024-03-03 ENCOUNTER — Emergency Department (HOSPITAL_COMMUNITY): Payer: No Typology Code available for payment source

## 2024-03-03 ENCOUNTER — Inpatient Hospital Stay (HOSPITAL_COMMUNITY): Payer: No Typology Code available for payment source

## 2024-03-03 ENCOUNTER — Encounter (HOSPITAL_COMMUNITY): Payer: Self-pay | Admitting: Cardiovascular Disease

## 2024-03-03 ENCOUNTER — Inpatient Hospital Stay (HOSPITAL_COMMUNITY): Admission: EM | Disposition: A | Payer: Self-pay | Source: Home / Self Care | Attending: Cardiology

## 2024-03-03 ENCOUNTER — Inpatient Hospital Stay (HOSPITAL_COMMUNITY)
Admission: EM | Admit: 2024-03-03 | Discharge: 2024-03-17 | DRG: 001 | Disposition: A | Payer: No Typology Code available for payment source | Attending: Cardiology | Admitting: Cardiology

## 2024-03-03 DIAGNOSIS — Z801 Family history of malignant neoplasm of trachea, bronchus and lung: Secondary | ICD-10-CM

## 2024-03-03 DIAGNOSIS — Z83719 Family history of colon polyps, unspecified: Secondary | ICD-10-CM

## 2024-03-03 DIAGNOSIS — K567 Ileus, unspecified: Secondary | ICD-10-CM | POA: Diagnosis not present

## 2024-03-03 DIAGNOSIS — Z7902 Long term (current) use of antithrombotics/antiplatelets: Secondary | ICD-10-CM

## 2024-03-03 DIAGNOSIS — Z811 Family history of alcohol abuse and dependence: Secondary | ICD-10-CM

## 2024-03-03 DIAGNOSIS — I5021 Acute systolic (congestive) heart failure: Secondary | ICD-10-CM | POA: Diagnosis not present

## 2024-03-03 DIAGNOSIS — J690 Pneumonitis due to inhalation of food and vomit: Secondary | ICD-10-CM | POA: Diagnosis not present

## 2024-03-03 DIAGNOSIS — I2102 ST elevation (STEMI) myocardial infarction involving left anterior descending coronary artery: Secondary | ICD-10-CM | POA: Diagnosis not present

## 2024-03-03 DIAGNOSIS — R570 Cardiogenic shock: Secondary | ICD-10-CM | POA: Diagnosis not present

## 2024-03-03 DIAGNOSIS — D1771 Benign lipomatous neoplasm of kidney: Secondary | ICD-10-CM | POA: Diagnosis present

## 2024-03-03 DIAGNOSIS — D6959 Other secondary thrombocytopenia: Secondary | ICD-10-CM | POA: Diagnosis present

## 2024-03-03 DIAGNOSIS — Z955 Presence of coronary angioplasty implant and graft: Secondary | ICD-10-CM

## 2024-03-03 DIAGNOSIS — Z8042 Family history of malignant neoplasm of prostate: Secondary | ICD-10-CM

## 2024-03-03 DIAGNOSIS — Z95811 Presence of heart assist device: Secondary | ICD-10-CM | POA: Diagnosis not present

## 2024-03-03 DIAGNOSIS — J9601 Acute respiratory failure with hypoxia: Secondary | ICD-10-CM | POA: Diagnosis not present

## 2024-03-03 DIAGNOSIS — I4901 Ventricular fibrillation: Secondary | ICD-10-CM | POA: Diagnosis present

## 2024-03-03 DIAGNOSIS — Z7982 Long term (current) use of aspirin: Secondary | ICD-10-CM

## 2024-03-03 DIAGNOSIS — I5023 Acute on chronic systolic (congestive) heart failure: Secondary | ICD-10-CM | POA: Diagnosis present

## 2024-03-03 DIAGNOSIS — E785 Hyperlipidemia, unspecified: Secondary | ICD-10-CM

## 2024-03-03 DIAGNOSIS — E7800 Pure hypercholesterolemia, unspecified: Secondary | ICD-10-CM | POA: Diagnosis present

## 2024-03-03 DIAGNOSIS — R310 Gross hematuria: Secondary | ICD-10-CM | POA: Diagnosis present

## 2024-03-03 DIAGNOSIS — I472 Ventricular tachycardia, unspecified: Secondary | ICD-10-CM | POA: Diagnosis present

## 2024-03-03 DIAGNOSIS — R7303 Prediabetes: Secondary | ICD-10-CM | POA: Diagnosis present

## 2024-03-03 DIAGNOSIS — R578 Other shock: Secondary | ICD-10-CM | POA: Diagnosis not present

## 2024-03-03 DIAGNOSIS — Z8419 Family history of other disorders of kidney and ureter: Secondary | ICD-10-CM

## 2024-03-03 DIAGNOSIS — I130 Hypertensive heart and chronic kidney disease with heart failure and stage 1 through stage 4 chronic kidney disease, or unspecified chronic kidney disease: Secondary | ICD-10-CM | POA: Diagnosis present

## 2024-03-03 DIAGNOSIS — Z8249 Family history of ischemic heart disease and other diseases of the circulatory system: Secondary | ICD-10-CM

## 2024-03-03 DIAGNOSIS — N401 Enlarged prostate with lower urinary tract symptoms: Secondary | ICD-10-CM | POA: Diagnosis present

## 2024-03-03 DIAGNOSIS — Z882 Allergy status to sulfonamides status: Secondary | ICD-10-CM

## 2024-03-03 DIAGNOSIS — K683 Retroperitoneal hematoma: Secondary | ICD-10-CM | POA: Diagnosis not present

## 2024-03-03 DIAGNOSIS — K219 Gastro-esophageal reflux disease without esophagitis: Secondary | ICD-10-CM | POA: Diagnosis present

## 2024-03-03 DIAGNOSIS — I2510 Atherosclerotic heart disease of native coronary artery without angina pectoris: Secondary | ICD-10-CM | POA: Diagnosis not present

## 2024-03-03 DIAGNOSIS — N400 Enlarged prostate without lower urinary tract symptoms: Secondary | ICD-10-CM

## 2024-03-03 DIAGNOSIS — I10 Essential (primary) hypertension: Secondary | ICD-10-CM

## 2024-03-03 DIAGNOSIS — N170 Acute kidney failure with tubular necrosis: Secondary | ICD-10-CM | POA: Diagnosis not present

## 2024-03-03 DIAGNOSIS — Z803 Family history of malignant neoplasm of breast: Secondary | ICD-10-CM

## 2024-03-03 DIAGNOSIS — Z8379 Family history of other diseases of the digestive system: Secondary | ICD-10-CM

## 2024-03-03 DIAGNOSIS — F05 Delirium due to known physiological condition: Secondary | ICD-10-CM | POA: Diagnosis not present

## 2024-03-03 DIAGNOSIS — Z800 Family history of malignant neoplasm of digestive organs: Secondary | ICD-10-CM

## 2024-03-03 DIAGNOSIS — K7200 Acute and subacute hepatic failure without coma: Secondary | ICD-10-CM | POA: Diagnosis present

## 2024-03-03 DIAGNOSIS — Z85038 Personal history of other malignant neoplasm of large intestine: Secondary | ICD-10-CM

## 2024-03-03 DIAGNOSIS — N1832 Chronic kidney disease, stage 3b: Secondary | ICD-10-CM | POA: Diagnosis present

## 2024-03-03 DIAGNOSIS — N1831 Chronic kidney disease, stage 3a: Secondary | ICD-10-CM

## 2024-03-03 DIAGNOSIS — N138 Other obstructive and reflux uropathy: Secondary | ICD-10-CM | POA: Diagnosis present

## 2024-03-03 DIAGNOSIS — H9190 Unspecified hearing loss, unspecified ear: Secondary | ICD-10-CM | POA: Diagnosis present

## 2024-03-03 DIAGNOSIS — M7981 Nontraumatic hematoma of soft tissue: Secondary | ICD-10-CM | POA: Diagnosis not present

## 2024-03-03 DIAGNOSIS — D62 Acute posthemorrhagic anemia: Secondary | ICD-10-CM | POA: Diagnosis not present

## 2024-03-03 DIAGNOSIS — Z79899 Other long term (current) drug therapy: Secondary | ICD-10-CM

## 2024-03-03 HISTORY — PX: LEFT HEART CATH AND CORONARY ANGIOGRAPHY: CATH118249

## 2024-03-03 HISTORY — PX: RIGHT HEART CATH: CATH118263

## 2024-03-03 HISTORY — PX: CORONARY/GRAFT ACUTE MI REVASCULARIZATION: CATH118305

## 2024-03-03 HISTORY — PX: VENTRICULAR ASSIST DEVICE INSERTION: CATH118273

## 2024-03-03 LAB — POCT I-STAT, CHEM 8
BUN: 31 mg/dL — ABNORMAL HIGH (ref 8–23)
Calcium, Ion: 1.11 mmol/L — ABNORMAL LOW (ref 1.15–1.40)
Chloride: 104 mmol/L (ref 98–111)
Creatinine, Ser: 1.6 mg/dL — ABNORMAL HIGH (ref 0.61–1.24)
Glucose, Bld: 151 mg/dL — ABNORMAL HIGH (ref 70–99)
HCT: 38 % — ABNORMAL LOW (ref 39.0–52.0)
Hemoglobin: 12.9 g/dL — ABNORMAL LOW (ref 13.0–17.0)
Potassium: 3.7 mmol/L (ref 3.5–5.1)
Sodium: 140 mmol/L (ref 135–145)
TCO2: 23 mmol/L (ref 22–32)

## 2024-03-03 LAB — POCT I-STAT 7, (LYTES, BLD GAS, ICA,H+H)
Acid-base deficit: 1 mmol/L (ref 0.0–2.0)
Acid-base deficit: 5 mmol/L — ABNORMAL HIGH (ref 0.0–2.0)
Acid-base deficit: 1 mmol/L (ref 0.0–2.0)
Bicarbonate: 22 mmol/L (ref 20.0–28.0)
Bicarbonate: 19.9 mmol/L — ABNORMAL LOW (ref 20.0–28.0)
Bicarbonate: 22.1 mmol/L (ref 20.0–28.0)
Calcium, Ion: 1.13 mmol/L — ABNORMAL LOW (ref 1.15–1.40)
Calcium, Ion: 1.07 mmol/L — ABNORMAL LOW (ref 1.15–1.40)
Calcium, Ion: 1.07 mmol/L — ABNORMAL LOW (ref 1.15–1.40)
HCT: 38 % — ABNORMAL LOW (ref 39.0–52.0)
HCT: 38 % — ABNORMAL LOW (ref 39.0–52.0)
HCT: 39 % (ref 39.0–52.0)
Hemoglobin: 12.9 g/dL — ABNORMAL LOW (ref 13.0–17.0)
Hemoglobin: 12.9 g/dL — ABNORMAL LOW (ref 13.0–17.0)
Hemoglobin: 13.3 g/dL (ref 13.0–17.0)
O2 Saturation: 92 %
O2 Saturation: 86 %
O2 Saturation: 88 %
Potassium: 3.6 mmol/L (ref 3.5–5.1)
Potassium: 3.5 mmol/L (ref 3.5–5.1)
Potassium: 4.2 mmol/L (ref 3.5–5.1)
Sodium: 140 mmol/L (ref 135–145)
Sodium: 128 mmol/L — ABNORMAL LOW (ref 135–145)
Sodium: 135 mmol/L (ref 135–145)
TCO2: 23 mmol/L (ref 22–32)
TCO2: 21 mmol/L — ABNORMAL LOW (ref 22–32)
TCO2: 23 mmol/L (ref 22–32)
pCO2 arterial: 29.6 mmHg — ABNORMAL LOW (ref 32–48)
pCO2 arterial: 35.8 mmHg (ref 32–48)
pCO2 arterial: 32 mmHg (ref 32–48)
pH, Arterial: 7.479 — ABNORMAL HIGH (ref 7.35–7.45)
pH, Arterial: 7.352 (ref 7.35–7.45)
pH, Arterial: 7.446 (ref 7.35–7.45)
pO2, Arterial: 59 mmHg — ABNORMAL LOW (ref 83–108)
pO2, Arterial: 53 mmHg — ABNORMAL LOW (ref 83–108)
pO2, Arterial: 51 mmHg — ABNORMAL LOW (ref 83–108)

## 2024-03-03 LAB — CBC
HCT: 35 % — ABNORMAL LOW (ref 39.0–52.0)
Hemoglobin: 11.9 g/dL — ABNORMAL LOW (ref 13.0–17.0)
MCH: 32.6 pg (ref 26.0–34.0)
MCHC: 34 g/dL (ref 30.0–36.0)
MCV: 95.9 fL (ref 80.0–100.0)
Platelets: 229 K/uL (ref 150–400)
RBC: 3.65 MIL/uL — ABNORMAL LOW (ref 4.22–5.81)
RDW: 13.1 % (ref 11.5–15.5)
WBC: 16.4 K/uL — ABNORMAL HIGH (ref 4.0–10.5)
nRBC: 0 % (ref 0.0–0.2)

## 2024-03-03 LAB — CBC WITH DIFFERENTIAL/PLATELET
Abs Immature Granulocytes: 0.07 K/uL (ref 0.00–0.07)
Abs Immature Granulocytes: 0.1 K/uL — ABNORMAL HIGH (ref 0.00–0.07)
Basophils Absolute: 0 K/uL (ref 0.0–0.1)
Basophils Absolute: 0 K/uL (ref 0.0–0.1)
Basophils Relative: 0 %
Basophils Relative: 0 %
Eosinophils Absolute: 0.1 K/uL (ref 0.0–0.5)
Eosinophils Absolute: 0 K/uL (ref 0.0–0.5)
Eosinophils Relative: 1 %
Eosinophils Relative: 0 %
HCT: 38.2 % — ABNORMAL LOW (ref 39.0–52.0)
HCT: 29 % — ABNORMAL LOW (ref 39.0–52.0)
Hemoglobin: 12.6 g/dL — ABNORMAL LOW (ref 13.0–17.0)
Hemoglobin: 9.5 g/dL — ABNORMAL LOW (ref 13.0–17.0)
Immature Granulocytes: 1 %
Immature Granulocytes: 1 %
Lymphocytes Relative: 11 %
Lymphocytes Relative: 4 %
Lymphs Abs: 1.2 K/uL (ref 0.7–4.0)
Lymphs Abs: 0.6 K/uL — ABNORMAL LOW (ref 0.7–4.0)
MCH: 31.8 pg (ref 26.0–34.0)
MCH: 32.5 pg (ref 26.0–34.0)
MCHC: 33 g/dL (ref 30.0–36.0)
MCHC: 32.8 g/dL (ref 30.0–36.0)
MCV: 96.5 fL (ref 80.0–100.0)
MCV: 99.3 fL (ref 80.0–100.0)
Monocytes Absolute: 1 K/uL (ref 0.1–1.0)
Monocytes Absolute: 1.3 K/uL — ABNORMAL HIGH (ref 0.1–1.0)
Monocytes Relative: 9 %
Monocytes Relative: 8 %
Neutro Abs: 8.9 K/uL — ABNORMAL HIGH (ref 1.7–7.7)
Neutro Abs: 13.9 K/uL — ABNORMAL HIGH (ref 1.7–7.7)
Neutrophils Relative %: 78 %
Neutrophils Relative %: 87 %
Platelets: 185 K/uL (ref 150–400)
Platelets: 192 K/uL (ref 150–400)
RBC: 3.96 MIL/uL — ABNORMAL LOW (ref 4.22–5.81)
RBC: 2.92 MIL/uL — ABNORMAL LOW (ref 4.22–5.81)
RDW: 13 % (ref 11.5–15.5)
RDW: 13 % (ref 11.5–15.5)
WBC: 11.3 K/uL — ABNORMAL HIGH (ref 4.0–10.5)
WBC: 15.9 K/uL — ABNORMAL HIGH (ref 4.0–10.5)
nRBC: 0 % (ref 0.0–0.2)
nRBC: 0 % (ref 0.0–0.2)

## 2024-03-03 LAB — COMPREHENSIVE METABOLIC PANEL WITH GFR
ALT: 19 U/L (ref 0–44)
AST: 21 U/L (ref 15–41)
Albumin: 3.7 g/dL (ref 3.5–5.0)
Alkaline Phosphatase: 34 U/L — ABNORMAL LOW (ref 38–126)
Anion gap: 16 — ABNORMAL HIGH (ref 5–15)
BUN: 30 mg/dL — ABNORMAL HIGH (ref 8–23)
CO2: 19 mmol/L — ABNORMAL LOW (ref 22–32)
Calcium: 8.5 mg/dL — ABNORMAL LOW (ref 8.9–10.3)
Chloride: 102 mmol/L (ref 98–111)
Creatinine, Ser: 1.49 mg/dL — ABNORMAL HIGH (ref 0.61–1.24)
GFR, Estimated: 49 mL/min — ABNORMAL LOW (ref >60)
Glucose, Bld: 145 mg/dL — ABNORMAL HIGH (ref 70–99)
Potassium: 3.6 mmol/L (ref 3.5–5.1)
Sodium: 137 mmol/L (ref 135–145)
Total Bilirubin: 0.8 mg/dL (ref 0.0–1.2)
Total Protein: 6.6 g/dL (ref 6.5–8.1)

## 2024-03-03 LAB — POCT I-STAT EG7
Acid-Base Excess: 0 mmol/L (ref 0.0–2.0)
Bicarbonate: 24.9 mmol/L (ref 20.0–28.0)
Calcium, Ion: 1.07 mmol/L — ABNORMAL LOW (ref 1.15–1.40)
HCT: 40 % (ref 39.0–52.0)
Hemoglobin: 13.6 g/dL (ref 13.0–17.0)
O2 Saturation: 48 %
Potassium: 4.2 mmol/L (ref 3.5–5.1)
Sodium: 135 mmol/L (ref 135–145)
TCO2: 26 mmol/L (ref 22–32)
pCO2, Ven: 39.8 mmHg — ABNORMAL LOW (ref 44–60)
pH, Ven: 7.404 (ref 7.25–7.43)
pO2, Ven: 26 mmHg — CL (ref 32–45)

## 2024-03-03 LAB — HEMOGLOBIN A1C
Hgb A1c MFr Bld: 5.6 % (ref 4.8–5.6)
Mean Plasma Glucose: 114.02 mg/dL

## 2024-03-03 LAB — BASIC METABOLIC PANEL WITH GFR
Anion gap: 18 — ABNORMAL HIGH (ref 5–15)
BUN: 31 mg/dL — ABNORMAL HIGH (ref 8–23)
CO2: 15 mmol/L — ABNORMAL LOW (ref 22–32)
Calcium: 7.5 mg/dL — ABNORMAL LOW (ref 8.9–10.3)
Chloride: 103 mmol/L (ref 98–111)
Creatinine, Ser: 2.09 mg/dL — ABNORMAL HIGH (ref 0.61–1.24)
GFR, Estimated: 33 mL/min — ABNORMAL LOW (ref >60)
Glucose, Bld: 363 mg/dL — ABNORMAL HIGH (ref 70–99)
Potassium: 3.7 mmol/L (ref 3.5–5.1)
Sodium: 136 mmol/L (ref 135–145)

## 2024-03-03 LAB — COOXEMETRY PANEL
Carboxyhemoglobin: 0.7 % (ref 0.5–1.5)
Methemoglobin: 0.7 % (ref 0.0–1.5)
O2 Saturation: 52.6 %
Total hemoglobin: 12.8 g/dL (ref 12.0–16.0)

## 2024-03-03 LAB — PREPARE RBC (CROSSMATCH)

## 2024-03-03 LAB — MAGNESIUM: Magnesium: 1.5 mg/dL — ABNORMAL LOW (ref 1.7–2.4)

## 2024-03-03 LAB — MRSA NEXT GEN BY PCR, NASAL: MRSA by PCR Next Gen: NOT DETECTED

## 2024-03-03 LAB — ECHOCARDIOGRAM LIMITED
Est EF: 30
Height: 70 in
Weight: 2800 [oz_av]

## 2024-03-03 LAB — ABO/RH: ABO/RH(D): A NEG

## 2024-03-03 LAB — POCT ACTIVATED CLOTTING TIME
Activated Clotting Time: 222 s
Activated Clotting Time: 331 s
Activated Clotting Time: 383 s

## 2024-03-03 LAB — LIPID PANEL
Cholesterol: 153 mg/dL (ref 0–200)
HDL: 54 mg/dL (ref >40)
LDL Cholesterol: 79 mg/dL (ref 0–99)
Total CHOL/HDL Ratio: 2.8 ratio
Triglycerides: 102 mg/dL (ref <150)
VLDL: 20 mg/dL (ref 0–40)

## 2024-03-03 LAB — PROTIME-INR
INR: 1 (ref 0.8–1.2)
Prothrombin Time: 13.7 s (ref 11.4–15.2)

## 2024-03-03 LAB — APTT: aPTT: 27 s (ref 24–36)

## 2024-03-03 LAB — CG4 I-STAT (LACTIC ACID)
Lactic Acid, Venous: 1.1 mmol/L (ref 0.5–1.9)
Lactic Acid, Venous: 1.7 mmol/L (ref 0.5–1.9)
Lactic Acid, Venous: 1.6 mmol/L (ref 0.5–1.9)

## 2024-03-03 LAB — TROPONIN I (HIGH SENSITIVITY)
Troponin I (High Sensitivity): 162 ng/L (ref <18)
Troponin I (High Sensitivity): >24000 ng/L (ref <18)

## 2024-03-03 LAB — GLUCOSE, CAPILLARY: Glucose-Capillary: 267 mg/dL — ABNORMAL HIGH (ref 70–99)

## 2024-03-03 SURGERY — LEFT HEART CATH AND CORONARY ANGIOGRAPHY
Anesthesia: LOCAL

## 2024-03-03 MED ORDER — PANTOPRAZOLE SODIUM 40 MG PO TBEC
40.0000 mg | DELAYED_RELEASE_TABLET | Freq: Every day | ORAL | Status: DC
  Administered 2024-03-04 – 2024-03-17 (×14): 40 mg via ORAL
  Filled 2024-03-03 (×14): qty 1

## 2024-03-03 MED ORDER — CANGRELOR BOLUS VIA INFUSION
INTRAVENOUS | Status: DC | PRN
  Administered 2024-03-03: 2382 ug via INTRAVENOUS

## 2024-03-03 MED ORDER — VERAPAMIL HCL 2.5 MG/ML IV SOLN
INTRAVENOUS | Status: DC | PRN
  Administered 2024-03-03: 10 mL via INTRA_ARTERIAL

## 2024-03-03 MED ORDER — DOBUTAMINE-DEXTROSE 4-5 MG/ML-% IV SOLN: 2.5000 ug/kg/min | INTRAVENOUS | Status: DC

## 2024-03-03 MED ORDER — MILRINONE LACTATE IN DEXTROSE 20-5 MG/100ML-% IV SOLN
INTRAVENOUS | Status: AC | PRN
  Administered 2024-03-03: .125 ug/kg/min via INTRAVENOUS

## 2024-03-03 MED ORDER — AMIODARONE HCL 150 MG/3ML IV SOLN
INTRAVENOUS | Status: DC | PRN
  Administered 2024-03-03: 150 mg via INTRAVENOUS

## 2024-03-03 MED ORDER — SODIUM CHLORIDE 0.9 % IV SOLN
0.7500 ug/kg/min | INTRAVENOUS | Status: DC
  Administered 2024-03-03: 4 ug/kg/min via INTRAVENOUS
  Administered 2024-03-04: 0.75 ug/kg/min via INTRAVENOUS
  Administered 2024-03-04 (×2): 4 ug/kg/min via INTRAVENOUS
  Filled 2024-03-03 (×6): qty 50

## 2024-03-03 MED ORDER — AMIODARONE HCL IN DEXTROSE 360-4.14 MG/200ML-% IV SOLN
60.0000 mg/h | INTRAVENOUS | Status: AC
  Filled 2024-03-03: qty 200

## 2024-03-03 MED ORDER — VASOPRESSIN 20 UNITS/100 ML INFUSION FOR SHOCK
INTRAVENOUS | Status: AC
  Administered 2024-03-03: 0.03 [IU]/min via INTRAVENOUS
  Filled 2024-03-03: qty 100

## 2024-03-03 MED ORDER — ALBUMIN HUMAN 5 % IV SOLN
INTRAVENOUS | Status: AC
  Administered 2024-03-03: 12.5 g via INTRAVENOUS
  Filled 2024-03-03: qty 250

## 2024-03-03 MED ORDER — SODIUM CHLORIDE 0.9% IV SOLUTION: Freq: Once | INTRAVENOUS | Status: AC

## 2024-03-03 MED ORDER — IOHEXOL 350 MG/ML SOLN
75.0000 mL | Freq: Once | INTRAVENOUS | Status: AC | PRN
  Administered 2024-03-03: 75 mL via INTRAVENOUS

## 2024-03-03 MED ORDER — LABETALOL HCL 5 MG/ML IV SOLN: 10.0000 mg | INTRAVENOUS | Status: AC | PRN

## 2024-03-03 MED ORDER — MILRINONE LACTATE IN DEXTROSE 20-5 MG/100ML-% IV SOLN
INTRAVENOUS | Status: AC
  Filled 2024-03-03: qty 100

## 2024-03-03 MED ORDER — DOBUTAMINE-DEXTROSE 4-5 MG/ML-% IV SOLN
INTRAVENOUS | Status: AC
Start: 2024-03-03 — End: 2024-03-03
  Administered 2024-03-03: 2.5 ug/kg/min via INTRAVENOUS
  Filled 2024-03-03: qty 250

## 2024-03-03 MED ORDER — AMIODARONE HCL 150 MG/3ML IV SOLN
INTRAVENOUS | Status: AC
  Filled 2024-03-03: qty 3

## 2024-03-03 MED ORDER — HEPARIN SODIUM (PORCINE) 1000 UNIT/ML IJ SOLN
INTRAMUSCULAR | Status: DC | PRN
  Administered 2024-03-03: 10000 [IU] via INTRAVENOUS
  Administered 2024-03-03: 5000 [IU] via INTRAVENOUS

## 2024-03-03 MED ORDER — FUROSEMIDE 10 MG/ML IJ SOLN: 80.0000 mg | Freq: Once | INTRAMUSCULAR | Status: DC

## 2024-03-03 MED ORDER — FINASTERIDE 5 MG PO TABS
5.0000 mg | ORAL_TABLET | Freq: Every day | ORAL | Status: DC
  Administered 2024-03-04 – 2024-03-17 (×14): 5 mg via ORAL
  Filled 2024-03-03 (×14): qty 1

## 2024-03-03 MED ORDER — ACETAMINOPHEN 325 MG PO TABS: 650.0000 mg | ORAL_TABLET | ORAL | Status: DC | PRN

## 2024-03-03 MED ORDER — ACETAMINOPHEN 325 MG PO TABS
650.0000 mg | ORAL_TABLET | ORAL | Status: DC | PRN
  Administered 2024-03-07: 650 mg via ORAL
  Filled 2024-03-03: qty 2

## 2024-03-03 MED ORDER — TICAGRELOR 90 MG PO TABS
ORAL_TABLET | ORAL | Status: AC
  Filled 2024-03-03: qty 2

## 2024-03-03 MED ORDER — SODIUM CHLORIDE 0.9 % IV BOLUS
250.0000 mL | Freq: Once | INTRAVENOUS | Status: AC
  Administered 2024-03-03: 250 mL via INTRAVENOUS

## 2024-03-03 MED ORDER — CANGRELOR TETRASODIUM 50 MG IV SOLR
INTRAVENOUS | Status: AC
  Filled 2024-03-03: qty 50

## 2024-03-03 MED ORDER — VASOPRESSIN 20 UNITS/100 ML INFUSION FOR SHOCK
0.0000 [IU]/min | INTRAVENOUS | Status: DC
  Administered 2024-03-04 – 2024-03-10 (×13): 0.03 [IU]/min via INTRAVENOUS
  Filled 2024-03-03 (×13): qty 100

## 2024-03-03 MED ORDER — TICAGRELOR 90 MG PO TABS: 90.0000 mg | ORAL_TABLET | Freq: Two times a day (BID) | ORAL | Status: DC

## 2024-03-03 MED ORDER — MORPHINE SULFATE (PF) 2 MG/ML IV SOLN
2.0000 mg | INTRAVENOUS | Status: DC | PRN
  Administered 2024-03-03: 2 mg via INTRAVENOUS
  Filled 2024-03-03: qty 1

## 2024-03-03 MED ORDER — LACTATED RINGERS IV SOLN: INTRAVENOUS | Status: AC

## 2024-03-03 MED ORDER — FUROSEMIDE 10 MG/ML IJ SOLN
INTRAMUSCULAR | Status: DC | PRN
Start: 2024-03-03 — End: 2024-03-03
  Administered 2024-03-03: 80 mg via INTRAVENOUS

## 2024-03-03 MED ORDER — SODIUM CHLORIDE 0.9 % IV SOLN
12.5000 mg | Freq: Four times a day (QID) | INTRAVENOUS | Status: DC | PRN
  Administered 2024-03-03: 12.5 mg via INTRAVENOUS
  Filled 2024-03-03: qty 0.5

## 2024-03-03 MED ORDER — ALBUMIN HUMAN 5 % IV SOLN
INTRAVENOUS | Status: AC
  Administered 2024-03-03: 25 g via INTRAVENOUS
  Filled 2024-03-03: qty 500

## 2024-03-03 MED ORDER — ALBUMIN HUMAN 5 % IV SOLN: 12.5000 g | INTRAVENOUS | Status: AC

## 2024-03-03 MED ORDER — DEXTROSE 5 % SOLN FOR IMPELLA PURGE CATHETER
INTRAVENOUS | Status: DC
  Filled 2024-03-03: qty 1000

## 2024-03-03 MED ORDER — POTASSIUM CHLORIDE 10 MEQ/100ML IV SOLN
10.0000 meq | INTRAVENOUS | Status: AC
  Administered 2024-03-03 – 2024-03-04 (×2): 10 meq via INTRAVENOUS
  Filled 2024-03-03 (×2): qty 100

## 2024-03-03 MED ORDER — AMIODARONE HCL IN DEXTROSE 360-4.14 MG/200ML-% IV SOLN
INTRAVENOUS | Status: AC | PRN
  Administered 2024-03-03: 60 mg/h via INTRAVENOUS

## 2024-03-03 MED ORDER — ASPIRIN 81 MG PO CHEW
CHEWABLE_TABLET | ORAL | Status: AC
  Filled 2024-03-03: qty 4

## 2024-03-03 MED ORDER — TAMSULOSIN HCL 0.4 MG PO CAPS
0.4000 mg | ORAL_CAPSULE | Freq: Every day | ORAL | Status: DC
  Administered 2024-03-04 – 2024-03-17 (×14): 0.4 mg via ORAL
  Filled 2024-03-03 (×14): qty 1

## 2024-03-03 MED ORDER — AMIODARONE HCL IN DEXTROSE 360-4.14 MG/200ML-% IV SOLN
60.0000 mg/h | INTRAVENOUS | Status: AC
  Administered 2024-03-03: 60 mg/h via INTRAVENOUS
  Filled 2024-03-03: qty 200

## 2024-03-03 MED ORDER — HEPARIN (PORCINE) IN NACL 1000-0.9 UT/500ML-% IV SOLN
INTRAVENOUS | Status: DC | PRN
  Administered 2024-03-03 (×2): 500 mL

## 2024-03-03 MED ORDER — MAGNESIUM SULFATE 2 GM/50ML IV SOLN
2.0000 g | Freq: Once | INTRAVENOUS | Status: AC
  Administered 2024-03-03: 2 g via INTRAVENOUS
  Filled 2024-03-03: qty 50

## 2024-03-03 MED ORDER — ONDANSETRON HCL 4 MG/2ML IJ SOLN
4.0000 mg | Freq: Four times a day (QID) | INTRAMUSCULAR | Status: DC | PRN
Start: 2024-03-03 — End: 2024-03-05
  Administered 2024-03-03 – 2024-03-05 (×3): 4 mg via INTRAVENOUS
  Filled 2024-03-03 (×5): qty 2

## 2024-03-03 MED ORDER — HYDRALAZINE HCL 20 MG/ML IJ SOLN: 10.0000 mg | INTRAMUSCULAR | Status: AC | PRN

## 2024-03-03 MED ORDER — SODIUM CHLORIDE 0.9% FLUSH
3.0000 mL | Freq: Two times a day (BID) | INTRAVENOUS | Status: DC
  Administered 2024-03-03 – 2024-03-12 (×17): 3 mL via INTRAVENOUS

## 2024-03-03 MED ORDER — FUROSEMIDE 10 MG/ML IJ SOLN
INTRAMUSCULAR | Status: AC
  Filled 2024-03-03: qty 8

## 2024-03-03 MED ORDER — SODIUM CHLORIDE 0.9 % IV SOLN: INTRAVENOUS | Status: DC

## 2024-03-03 MED ORDER — ONDANSETRON HCL 4 MG/2ML IJ SOLN
4.0000 mg | Freq: Four times a day (QID) | INTRAMUSCULAR | Status: DC | PRN
  Administered 2024-03-05 – 2024-03-17 (×8): 4 mg via INTRAVENOUS
  Filled 2024-03-03 (×7): qty 2

## 2024-03-03 MED ORDER — LIDOCAINE HCL (PF) 1 % IJ SOLN
INTRAMUSCULAR | Status: AC
  Filled 2024-03-03: qty 30

## 2024-03-03 MED ORDER — IOHEXOL 350 MG/ML SOLN
INTRAVENOUS | Status: DC | PRN
  Administered 2024-03-03: 200 mL

## 2024-03-03 MED ORDER — SODIUM CHLORIDE 0.9 % IV SOLN: 250.0000 mL | INTRAVENOUS | Status: AC | PRN

## 2024-03-03 MED ORDER — SODIUM CHLORIDE 0.9 % IV SOLN
INTRAVENOUS | Status: AC | PRN
  Administered 2024-03-03: 250 mL/h via INTRAVENOUS

## 2024-03-03 MED ORDER — SODIUM CHLORIDE 0.9% FLUSH: 3.0000 mL | INTRAVENOUS | Status: DC | PRN

## 2024-03-03 MED ORDER — OXYCODONE HCL 5 MG PO TABS
5.0000 mg | ORAL_TABLET | ORAL | Status: DC | PRN
  Administered 2024-03-10 – 2024-03-16 (×2): 5 mg via ORAL
  Filled 2024-03-03 (×2): qty 1

## 2024-03-03 MED ORDER — HEPARIN SODIUM (PORCINE) 1000 UNIT/ML IJ SOLN
INTRAMUSCULAR | Status: AC
  Filled 2024-03-03: qty 20

## 2024-03-03 MED ORDER — TICAGRELOR 90 MG PO TABS: 180.0000 mg | ORAL_TABLET | Freq: Once | ORAL | Status: DC

## 2024-03-03 MED ORDER — ASPIRIN 81 MG PO CHEW
81.0000 mg | CHEWABLE_TABLET | Freq: Every day | ORAL | Status: DC
  Administered 2024-03-04 – 2024-03-09 (×6): 81 mg via ORAL
  Filled 2024-03-03 (×6): qty 1

## 2024-03-03 MED ORDER — ONDANSETRON HCL 4 MG/2ML IJ SOLN
INTRAMUSCULAR | Status: DC | PRN
  Administered 2024-03-03: 4 mg via INTRAVENOUS

## 2024-03-03 MED ORDER — NOREPINEPHRINE 4 MG/250ML-% IV SOLN
0.0000 ug/min | INTRAVENOUS | Status: DC
  Administered 2024-03-03: 26 ug/min via INTRAVENOUS
  Administered 2024-03-03: 22 ug/min via INTRAVENOUS
  Administered 2024-03-04 (×3): 10 ug/min via INTRAVENOUS
  Administered 2024-03-04: 18 ug/min via INTRAVENOUS
  Administered 2024-03-05: 10 ug/min via INTRAVENOUS
  Administered 2024-03-05 (×2): 11 ug/min via INTRAVENOUS
  Administered 2024-03-05 – 2024-03-06 (×2): 13 ug/min via INTRAVENOUS
  Administered 2024-03-06: 11 ug/min via INTRAVENOUS
  Administered 2024-03-06 (×2): 13 ug/min via INTRAVENOUS
  Administered 2024-03-07: 11 ug/min via INTRAVENOUS
  Administered 2024-03-07: 12 ug/min via INTRAVENOUS
  Administered 2024-03-07: 13 ug/min via INTRAVENOUS
  Administered 2024-03-07: 8 ug/min via INTRAVENOUS
  Administered 2024-03-07: 13 ug/min via INTRAVENOUS
  Administered 2024-03-09: 7 ug/min via INTRAVENOUS
  Administered 2024-03-15: 5 ug/min via INTRAVENOUS
  Filled 2024-03-03 (×21): qty 250

## 2024-03-03 MED ORDER — METOCLOPRAMIDE HCL 5 MG/ML IJ SOLN
10.0000 mg | Freq: Three times a day (TID) | INTRAMUSCULAR | Status: DC | PRN
  Administered 2024-03-03: 10 mg via INTRAVENOUS
  Filled 2024-03-03: qty 2

## 2024-03-03 MED ORDER — SODIUM BICARBONATE 8.4 % IV SOLN
INTRAVENOUS | Status: DC
  Filled 2024-03-03 (×7): qty 25

## 2024-03-03 MED ORDER — ONDANSETRON HCL 4 MG/2ML IJ SOLN
INTRAMUSCULAR | Status: AC
Start: 2024-03-03 — End: 2024-03-03
  Filled 2024-03-03: qty 2

## 2024-03-03 MED ORDER — ALBUMIN HUMAN 5 % IV SOLN: 25.0000 g | Freq: Once | INTRAVENOUS | Status: AC

## 2024-03-03 MED ORDER — MILRINONE LACTATE IN DEXTROSE 20-5 MG/100ML-% IV SOLN
0.2500 ug/kg/min | INTRAVENOUS | Status: DC
  Filled 2024-03-03: qty 100

## 2024-03-03 MED ORDER — LIDOCAINE HCL (PF) 1 % IJ SOLN
INTRAMUSCULAR | Status: DC | PRN
  Administered 2024-03-03: 5 mL
  Administered 2024-03-03 (×2): 2 mL

## 2024-03-03 MED ORDER — SODIUM BICARBONATE 8.4 % IV SOLN: INTRAVENOUS | Status: DC

## 2024-03-03 MED ORDER — ROSUVASTATIN CALCIUM 20 MG PO TABS
20.0000 mg | ORAL_TABLET | Freq: Every day | ORAL | Status: DC
  Administered 2024-03-04 – 2024-03-17 (×14): 20 mg via ORAL
  Filled 2024-03-03 (×14): qty 1

## 2024-03-03 MED ORDER — SODIUM CHLORIDE 0.9 % IV SOLN
INTRAVENOUS | Status: AC | PRN
  Administered 2024-03-03: 4 ug/kg/min via INTRAVENOUS

## 2024-03-03 MED ORDER — SODIUM CHLORIDE 0.9 % IV SOLN
3.0000 g | Freq: Four times a day (QID) | INTRAVENOUS | Status: AC
  Administered 2024-03-03 – 2024-03-08 (×19): 3 g via INTRAVENOUS
  Filled 2024-03-03 (×19): qty 8

## 2024-03-03 MED ORDER — NITROGLYCERIN 1 MG/10 ML FOR IR/CATH LAB
INTRA_ARTERIAL | Status: AC
  Filled 2024-03-03: qty 10

## 2024-03-03 MED ORDER — NOREPINEPHRINE BITARTRATE 1 MG/ML IV SOLN
INTRAVENOUS | Status: AC | PRN
  Administered 2024-03-03: 10 ug/min via INTRAVENOUS

## 2024-03-03 SURGICAL SUPPLY — 29 items
BALLOON EMERGE MR 2.5X12 (BALLOONS) IMPLANT
BALLOON EMERGE MR 4.0X12 (BALLOONS) IMPLANT
BALLOON EMERGE MR 4.0X8 (BALLOONS) IMPLANT
BALLOON SAPPHIRE 2.5X12 (BALLOONS) IMPLANT
BALLOON ~~LOC~~ EMERGE MR 4.0X15 (BALLOONS) IMPLANT
CATH INFINITI 5 FR JL3.5 (CATHETERS) IMPLANT
CATH INFINITI 5FR ANG PIGTAIL (CATHETERS) IMPLANT
CATH INFINITI JR4 5F (CATHETERS) IMPLANT
CATH LAUNCHER 6FR EBU3.5 (CATHETERS) IMPLANT
CATH SWAN GANZ 7F STRAIGHT (CATHETERS) IMPLANT
CATH VISTA GUIDE 6FR XBLAD4 (CATHETERS) IMPLANT
DEVICE IMPELLA CP SMRT ASSIST (CATHETERS) IMPLANT
DEVICE RAD COMP TR BAND LRG (VASCULAR PRODUCTS) IMPLANT
ELECT DEFIB PAD ADLT CADENCE (PAD) IMPLANT
GLIDESHEATH SLEND SS 6F .021 (SHEATH) IMPLANT
GUIDEWIRE INQWIRE 1.5J.035X260 (WIRE) IMPLANT
KIT ENCORE 26 ADVANTAGE (KITS) IMPLANT
KIT MICROPUNCTURE NIT STIFF (SHEATH) IMPLANT
PACK CARDIAC CATHETERIZATION (CUSTOM PROCEDURE TRAY) ×1 IMPLANT
SET ATX-X65L (MISCELLANEOUS) IMPLANT
SHEATH PINNACLE 6F 10CM (SHEATH) IMPLANT
SHEATH PINNACLE 8F 10CM (SHEATH) IMPLANT
SHIELD CATH-GARD CONTAMINATION (MISCELLANEOUS) IMPLANT
STENT SYNERGY XD 3.50X20 (Permanent Stent) IMPLANT
TUBING CIL FLEX 10 FLL-RA (TUBING) IMPLANT
WIRE ASAHI PROWATER 180CM (WIRE) IMPLANT
WIRE HI TORQ WHISPER MS 190CM (WIRE) IMPLANT
WIRE MICRO SET 5FR 12 (WIRE) IMPLANT
WIRE RUNTHROUGH .014X180CM (WIRE) IMPLANT

## 2024-03-03 NOTE — Progress Notes (Signed)
 73 year old male cardiovascular risk factors here with anterior STEMI complicated by MI cardiogenic shock status post Impella CP via right common femoral artery.  Underwent successful PCI to LAD, proximal RCA occlusion also noted but not culprit.  Called given rapidly intensifying pressor requirements with expanding hematoma in the right groin.  Manual pressure being held for 30+ minutes with maps dropping to the 40s despite escalating levo and adding vaso.  Suction alarms on P6.  Dropped P5.  Bedside ultrasound demonstrating fully collapsed IVC, similar LV and RV function.  Impella in correct position.  Concern for hemorrhagic shock.  Emergency blood ordered 2 units and albumin given with rapid improvement and blood pressure.  He is on cangrelor for recent stent, heparin being held, status post aspirin.  Normal platelets.  High risk for in-stent thrombosis will continue cangrelor for now and hopefully resolution of bleeding.  Patient had AKI on CKD with creatinine of 2, risk-benefit of CTA for evaluation of arterial bleed favors imaging.  I have ordered stat CT abdomen and pelvis to mid thigh to evaluate for an active arterial bleed.  Hemoglobin dropped from 12-9.5.  Will need serial hemoglobin and groin checks throughout the night.  Timothy Nicholson. Vonda, MD

## 2024-03-03 NOTE — Progress Notes (Signed)
 eLink Physician-Brief Progress Note Patient Name: Timothy Nicholson DOB: [birth year] MRN: [id number]   Date of Service  03/03/2024  HPI/Events of Note  73 year old male that presented with anterior STEMI status post PCI and DES to the LAD on cangrelor.  Patient has expanding right lower extremity hematoma with concern for ongoing bleeding and hemorrhagic shock.  Max peripheral norepinephrine, attempting fluid resuscitation.  Heart failure team requesting additional IV access  eICU Interventions  Refer to ground team for potential placement of MAC introducer for central access, airway stable for the time being, primary team considering MTP     Intervention Category Intermediate Interventions: Bleeding - evaluation and treatment with blood products  Nneoma Harral 03/03/2024, 9:25 PM

## 2024-03-03 NOTE — H&P (Addendum)
 Advanced Heart Failure Team History and Physical Note   PCP:  [name] ORN, MD  PCP-Cardiology: None     Reason for Admission: Anterior STEMI   HPI:   Timothy Nicholson is a 73 y.o. male with history of HTN, HLD, GERD, BPH, CKD IIIa, prediabetes. No prior cardiac history on chart review. Unable to obtain further history from patient during emergent procedure.  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 Pain began about 1hr prior to arrival. Patient presented today with anterior STEMI. He was taken for emergent LHC. He was hypotensive on arrival requiring initiation of norepinephrine infusion. LVEDP elevated at 30. Decision made to unload LV with Impella CP. As Impella was placed he went into VT/VF and was shocked. Coronaries then injected and found to have 100% p LAD treated with PCI. He was then admitted to the ICU for further management.   Home Medications Prior to Admission medications   Medication Sig Start Date End Date Taking? Authorizing Provider  acetaminophen  (TYLENOL ) 500 MG tablet Take 500 mg by mouth every 6 (six) hours as needed for mild pain or moderate pain.    [provider]  bacitracin -polymyxin b  (POLYSPORIN ) ophthalmic ointment Place 1 application into the left eye 3 (three) times daily. apply to eye every 12 hours while awake 09/21/19   Alvia Norleen BIRCH, MD  finasteride  (PROSCAR ) 5 MG tablet Take 5 mg by mouth daily.    [provider]  gatifloxacin  (ZYMAXID ) 0.5 % SOLN Place 1 drop into the left eye 4 (four) times daily. 09/21/19   Matthews, John D, MD  Glucosamine HCl 1500 MG TABS Take 1,500 mg by mouth daily. turmeric    [provider]  lisinopril -hydrochlorothiazide  (PRINZIDE ,ZESTORETIC ) 10-12.5 MG tablet Take 1 tablet by mouth daily.    [provider]  Multiple Vitamins-Minerals (MULTIVITAMIN WITH MINERALS) tablet Take 1 tablet by mouth daily.    [provider]  pantoprazole  (PROTONIX ) 40 MG  tablet Take 40 mg by mouth daily.    [provider]  prednisoLONE  acetate (PRED FORTE ) 1 % ophthalmic suspension Place 1 drop into the left eye 4 (four) times daily. 09/21/19   Alvia Norleen BIRCH, MD  rosuvastatin  (CRESTOR ) 10 MG tablet Take 10 mg by mouth daily. 06/13/19 06/12/20  [provider]  tamsulosin (FLOMAX) 0.4 MG CAPS capsule Take 0.4 mg by mouth daily.     [provider]    Past Medical History: Past Medical History:  Diagnosis Date   Arthritis    osteo   Back pain    Benign prostatic hyperplasia with urinary obstruction and other lower urinary tract symptoms    Cancer (HCC)    maligant polyp   Dyslipidemia    Dysplastic nevus 08/13/2006   L chest pectoral - mod, excision 06.10.2008   Dysplastic nevus 09/29/2006   L groin - mild   Elevated PSA    Enlarged prostate    Family history of breast cancer    Family history of colon cancer    Family history of lung cancer    Family history of pancreatic cancer    Family history of prostate cancer    GERD (gastroesophageal reflux disease)    Headache    migraine without aura   HOH (hard of hearing)    Hypercholesterolemia    Hypertension    Personal history of colon cancer 01/19/2018   Scoliosis     Past Surgical History: Past Surgical History:  Procedure Laterality Date  COLONOSCOPY  08/27/2010   Dr. Gaylyn Tubular adenomas, FH polyps   COLONOSCOPY     COLONOSCOPY WITH PROPOFOL  N/A 11/19/2017   Procedure: COLONOSCOPY WITH PROPOFOL ;  Surgeon: Gaylyn Gladis PENNER, MD;  Location: [facility] ENDOSCOPY;  Service: Endoscopy;  Laterality: N/A;   COLONOSCOPY WITH PROPOFOL  N/A 04/29/2018   Procedure: COLONOSCOPY WITH PROPOFOL ;  Surgeon: Gaylyn Gladis PENNER, MD;  Location: [facility] ENDOSCOPY;  Service: Endoscopy;  Laterality: N/A;   COLONOSCOPY WITH PROPOFOL  N/A 04/30/2018   Procedure: COLONOSCOPY WITH PROPOFOL ;  Surgeon: Gaylyn Gladis PENNER, MD;  Location: [facility] ENDOSCOPY;  Service: Endoscopy;  Laterality: N/A;    COLONOSCOPY WITH PROPOFOL  N/A 05/31/2018   Procedure: COLONOSCOPY WITH PROPOFOL ;  Surgeon: Gaylyn Gladis PENNER, MD;  Location: [facility] ENDOSCOPY;  Service: Endoscopy;  Laterality: N/A;   COLONOSCOPY WITH PROPOFOL  N/A 09/17/2018   Procedure: COLONOSCOPY WITH PROPOFOL ;  Surgeon: Gaylyn Gladis PENNER, MD;  Location: [facility] ENDOSCOPY;  Service: Endoscopy;  Laterality: N/A;   COLONOSCOPY WITH PROPOFOL  N/A 06/25/2020   Procedure: COLONOSCOPY WITH PROPOFOL ;  Surgeon: Maryruth Ole DASEN, MD;  Location: [facility] ENDOSCOPY;  Service: Endoscopy;  Laterality: N/A;   ESOPHAGOGASTRODUODENOSCOPY (EGD) WITH PROPOFOL  N/A 11/19/2017   Procedure: ESOPHAGOGASTRODUODENOSCOPY (EGD) WITH PROPOFOL ;  Surgeon: Gaylyn Gladis PENNER, MD;  Location: [facility] ENDOSCOPY;  Service: Endoscopy;  Laterality: N/A;   GAS INSERTION Left 09/20/2019   Procedure: INSERTION OF GAS;  Surgeon: Alvia Norleen BIRCH, MD;  Location: [facility] OR;  Service: Ophthalmology;  Laterality: Left;   LASER PHOTO ABLATION Left 09/20/2019   Procedure: Laser Photo Ablation;  Surgeon: Alvia Norleen BIRCH, MD;  Location: [facility] OR;  Service: Ophthalmology;  Laterality: Left;   MEMBRANE PEEL Left 09/20/2019   Procedure: Ottie Booty;  Surgeon: Alvia Norleen BIRCH, MD;  Location: [facility] OR;  Service: Ophthalmology;  Laterality: Left;   PARS PLANA VITRECTOMY W/ SCLERAL BUCKLE Left 09/20/2019   Procedure: 25 Gauge PARS PLANA VITRECTOMY WITH LASER FOR MACULAR HOLE;  Surgeon: Alvia Norleen BIRCH, MD;  Location: [facility] OR;  Service: Ophthalmology;  Laterality: Left;   SERUM PATCH Left 09/20/2019   Procedure: SERUM PATCH;  Surgeon: Alvia Norleen BIRCH, MD;  Location: [facility] OR;  Service: Ophthalmology;  Laterality: Left;   TONSILLECTOMY     VITRECTOMY Left 09/20/2019    25 Gauge PARS PLANA VITRECTOMY WITH LASER FOR MACULAR HOLE (Left Eye)    Family History:  Family History  Problem Relation Age of Onset   Breast cancer Mother 77   Lung cancer Father 2       hx of smoking   Colon polyps Father        'had 9 polyps on his  last colonocospy'   Colon cancer Maternal Uncle 75   Lung cancer Paternal Aunt 19       hx smoking   Pancreatic cancer Paternal Uncle        hx of alcohol abuse   Heart disease Maternal Grandfather    Aortic aneurysm Maternal Grandfather    Kidney failure Paternal Grandmother    Prostate cancer Maternal Uncle        metastatic, died of this cancer in 70's/80's   Cirrhosis Paternal Uncle        hx of alcohol abuse   Breast cancer Cousin        9's   Breast cancer Cousin        67's   Breast cancer Cousin        97's    Social History: Social History   Socioeconomic History  Marital status: Married    Spouse name: Not on file   Number of children: Not on file   Years of education: Not on file   Highest education level: Not on file  Occupational History   Not on file  Tobacco Use   Smoking status: Never   Smokeless tobacco: Never  Vaping Use   Vaping status: Never Used  Substance and Sexual Activity   Alcohol use: Not Currently   Drug use: No   Sexual activity: Not on file  Other Topics Concern   Not on file  Social History Narrative   Not on file   Social Drivers of Health   Financial Resource Strain: Low Risk  (08/04/2023)   Received from [facility]   Overall Financial Resource Strain (CARDIA)    Difficulty of Paying Living Expenses: Not hard at all  Food Insecurity: No Food Insecurity (08/04/2023)   Received from [facility]   Hunger Vital Sign    Within the past 12 months, you worried that your food would run out before you got the money to buy more.: Never true    Within the past 12 months, the food you bought just didn't last and you didn't have money to get more.: Never true  Transportation Needs: No Transportation Needs (08/04/2023)   Received from [facility] - Transportation    In the past 12 months, has lack of transportation kept you from medical appointments or from getting  medications?: No    Lack of Transportation (Non-Medical): No  Physical Activity: Not on file  Stress: Not on file  Social Connections: Not on file    Allergies:  Allergies  Allergen Reactions   Sulfa Antibiotics Rash    Objective:    Vital Signs:   SpO2:  [88 %] 88 % (11/20 1530) Weight:  [79.4 kg] 79.4 kg (11/20 1400)   Filed Weights   03/03/24 1400  Weight: 79.4 kg     Physical Exam    Limited exam General: Draped in sterile field in cath lab Cor: Regular rate & rhythm.  Extremities: R femoral Impella CP  Labs     Basic Metabolic Panel: Recent Labs  Lab 03/03/24 1510  NA 140  K 3.6    Liver Function Tests: No results for input(s): AST, ALT, ALKPHOS, BILITOT, PROT, ALBUMIN in the last 168 hours. No results for input(s): LIPASE, AMYLASE in the last 168 hours. No results for input(s): AMMONIA in the last 168 hours.  CBC: Recent Labs  Lab 03/03/24 1505 03/03/24 1510  WBC 11.3*  --   NEUTROABS 8.9*  --   HGB 12.6* 12.9*  HCT 38.2* 38.0*  MCV 96.5  --   PLT 185  --     Cardiac Enzymes: No results for input(s): CKTOTAL, CKMB, CKMBINDEX, TROPONINI in the last 168 hours.  BNP: BNP (last 3 results) No results for input(s): BNP in the last 8760 hours.  ProBNP (last 3 results) No results for input(s): PROBNP in the last 8760 hours.   CBG: No results for input(s): GLUCAP in the last 168 hours.  Coagulation Studies: No results for input(s): LABPROT, INR in the last 72 hours.  Imaging: No results found.   Patient Profile   73 y.o. male with history of HTN, HLD, GERD, BPH, CKD IIIa, prediabetes.  Admitted with anterior STEMI c/b cardiogenic shock.  Assessment/Plan   Anterior STEMI/CAD -Emergent LHC w/ 100% p LAD treated with Impella assisted PCI/DES -  Cangrelor in lab. Will need DAPT once he can take po - vomited en route and in cath lab. -High intensity statin  Cardiogenic shock -In setting of anterior  MI -LVEDP 30 in cath -Initial lactic acid okay -s/p Impella CP placement. Impella at P9 with good flows. Follow LDH, platelets. Heparin gtt. -Currently on NE at 15 mcg/min to maintain MAP -Wean pressors as able. Follow hemodynamics on PA catheter. -Check formal echo  VT/VF -As Impella was inserted, shock X 1 -On amiodarone gtt at 30 mg/hr  CKD IIIa -Baseline Scr 1.3 -Follow  HLD  -Increase home rosuvastatin  to 20 -Check lipid panel -Goal LDL < 55  BPH -on finasteride  and flomax -Watch for urinary retention  FINCH, LINDSAY N, PA-C 03/03/2024, 3:57 PM  Total Time Spent  Advanced Heart Failure Team Pager [phone] (M-F; 7a - 5p)  Please contact [facility] for night-coverage after hours (4p -7a ) and weekends on [email or web address]  Patient seen with PA, I formulated the plan and agree with the above note.   Patient presented with anterior STEMI with cardiogenic shock requiring initiation of norepinephrine.  He was found to have occluded proximal LAD with LVEDP 30.  Impella CP was placed initially.  There was VT requiring shock during Impella placement, amiodarone gtt begun.  The patient then had DES to the proximal LAD with PTCA to ostial D1.  The proximal RCA was not selectively engaged but appeared to be occluded.   CI 1.9 via Swan with PCWP 24 on RHC. Lasix 80 mg IV was given and milrinone 0.25 mcg/kg/min started in addition to NE.    CXR showed pulmonary edema, patient required HFNC. Unasyn started for possible aspiration.   General: NAD Neck: No JVD, no thyromegaly or thyroid nodule.  Lungs: Clear to auscultation bilaterally with normal respiratory effort. CV: Nondisplaced PMI.  Heart regular S1/S2, no S3/S4, no murmur.  1+ ankle edema.  No carotid bruit.  Normal pedal pulses.  Abdomen: Soft, nontender, no hepatosplenomegaly, no distention.  Skin: Intact without lesions or rashes.  Neurologic: Alert and oriented x 3.  Psych: Normal affect. Extremities: No clubbing or  cyanosis.  HEENT: Normal.   1. CAD: Anterior STEMI with occluded proximal LAD; the RCA was not selectively engaged but appeared to be relatively small with proximal occlusion. Now s/p DES to proximal LAD and PTCA to ostial D1.  - Cangrelor IV for now, transition to ticagrelor + ASA 81 when he can take po (very nauseated currently).  - Crestor  2. Cardiogenic shock: Due to anterior MI.  Limited echo with Impella showed EF 25% with LAD territory akinesis, normal RV size/systolic function.  Patient currently on milrinone 0.25 and NE 11 with CVP low < 5 after receiving Lasix 80 mg IV x 1 in cath lab.  CI 2.1 by thermodilution, lactate 1.7.  - Continue milrinone 0.25 - Wean NE as able.  - Will not give further Lasix tonight.   - Repeat echo in am for Impella position.  3. CKD stage 3: Creatinine 1.6, not far off from baseline.  Follow closely.  4. VT: Peri-STEMI. - Continue amiodarone gtt 30 mg/hr for now.   CRITICAL CARE Performed by: Ezra Shuck  Total critical care time: 70 minutes  Critical care time was exclusive of separately billable procedures and treating other patients.  Critical care was necessary to treat or prevent imminent or life-threatening deterioration.  Critical care was time spent personally by me on the following activities: development of treatment plan with patient and/or  surrogate as well as nursing, discussions with consultants, evaluation of patient's response to treatment, examination of patient, obtaining history from patient or surrogate, ordering and performing treatments and interventions, ordering and review of laboratory studies, ordering and review of radiographic studies, pulse oximetry and re-evaluation of patient's condition.    Ezra Shuck 03/03/2024 7:07 PM

## 2024-03-03 NOTE — Progress Notes (Signed)
 Patient placed on heated high flow at 50L/100%.  Tolerating well with sats of 92%.  Will continue to monitor.

## 2024-03-03 NOTE — Procedures (Signed)
 Arterial Catheter Insertion Procedure Note  Timothy Nicholson  [id number]  02/09/1951  Date:03/03/24  Time:6:33 PM    Provider Performing: Leita SHAUNNA Gaskins    Procedure: Insertion of Arterial Line (63379) with US  guidance (23062)   Indication(s) Blood pressure monitoring and/or need for frequent ABGs  Consent Risks of the procedure as well as the alternatives and risks of each were explained to the patient and/or caregiver.  Consent for the procedure was obtained and is signed in the bedside chart  Anesthesia None   Time Out Verified patient identification, verified procedure, site/side was marked, verified correct patient position, special equipment/implants available, medications/allergies/relevant history reviewed, required imaging and test results available.   Sterile Technique Maximal sterile technique including full sterile barrier drape, hand hygiene, sterile gown, sterile gloves, mask, hair covering, sterile ultrasound probe cover (if used).   Procedure Description Area of catheter insertion was cleaned with chlorhexidine  and draped in sterile fashion. With real-time ultrasound guidance an arterial catheter was placed into the left radial artery.  Appropriate arterial tracings confirmed on monitor.     Complications/Tolerance None; patient tolerated the procedure well.   EBL Minimal   Specimen(s) None  Leita SHAUNNA Gaskins, DO 03/03/24 6:35 PM [facility]  For contact information, see Amion. If no response to pager, please call PCCM consult pager. After hours, 7PM- 7AM, please call Elink.

## 2024-03-03 NOTE — Consult Note (Signed)
 NAME:  Timothy Nicholson, MRN:  [id number], DOB:  1951-03-27, LOS: 0 ADMISSION DATE:  03/03/2024, CONSULTATION DATE: 03/03/2024 REFERRING MD: Dr. Ezra, CHIEF COMPLAINT: Chest pain  History of Present Illness:  73 year old male with hypertension, hyperlipidemia, BPH, CKD 3a and prediabetes who presented as a code STEMI, after he developed chest pain while working at home, he was emergently taken to Cath Lab, underwent LHC.  During procedure he was hypotensive requiring Levophed  infusion, vomited and aspirated requiring nonrebreather facemask.  His LVEDP was 30 with wedge pressure of 24, CPAP was placed after that he went into brief VT/VF and was shocked.  Patient was noted to have occluded proximal LAD, underwent PCI and DES, currently on cangrelor  infusion. Continue to complain of nausea, denies chest pain, difficulty breathing, fever, chills, dysuria, urgency, frequency other complaints  Pertinent  Medical History   Past Medical History:  Diagnosis Date   Arthritis    osteo   Back pain    Benign prostatic hyperplasia with urinary obstruction and other lower urinary tract symptoms    Cancer (HCC)    maligant polyp   Dyslipidemia    Dysplastic nevus 08/13/2006   L chest pectoral - mod, excision 06.10.2008   Dysplastic nevus 09/29/2006   L groin - mild   Elevated PSA    Enlarged prostate    Family history of breast cancer    Family history of colon cancer    Family history of lung cancer    Family history of pancreatic cancer    Family history of prostate cancer    GERD (gastroesophageal reflux disease)    Headache    migraine without aura   HOH (hard of hearing)    Hypercholesterolemia    Hypertension    Personal history of colon cancer 01/19/2018   Scoliosis      Significant Hospital Events: Including procedures, antibiotic start and stop dates in addition to other pertinent events     Interim History / Subjective:  As above  Objective    Blood pressure (!) 111/57,  pulse (!) 0, resp. rate (!) 25, height 5' 10 (1.778 m), weight 79.4 kg, SpO2 (!) 86%.       No intake or output data in the 24 hours ending 03/03/24 1733 Filed Weights   03/03/24 1400  Weight: 79.4 kg    Examination: General: Acutely ill-appearing elderly male, lying on the bed HEENT: [place]/AT, eyes anicteric.  moist mucus membranes.  Currently on heated high flow nasal cannula oxygen Neuro: Alert, awake following commands Chest: Diminished air entry all over Heart: Regular rate and rhythm, no murmurs or gallops Abdomen: Soft, nontender, nondistended, bowel sounds present Skin: Cold no mottling noted  Labs noted, imaging pending I do not have EKG to review, it was reviewed in Cath Lab and noted to have acute anterior STEMI  Patient Lines/Drains/Airways Status     Active Line/Drains/Airways     Name Placement date Placement time Site Days   Sheath 03/03/24 Right Internal Jugular 03/03/24  1644  Internal Jugular  less than 1   Impella 03/03/24  1522  -- less than 1            Resolved problem list   Assessment and Plan  Acute anterior STEMI status post PCI and DES to LAD Acute HFrEF with cardiogenic shock status post CP Impella Transient VT/V-fib status post shock Acute respiratory failure with hypoxia Aspiration pneumonia/pneumonitis CKD stage IIIa Hyperlipidemia Hypertension BPH  Patient is currently on cangrelor  infusion, continue  DAPT Continue as needed Zofran  Monitor intake and output Currently requiring vasopressor support with Levophed  at 10 mics and on inotropic support with dobutamine  CP Impella is at P9 with 3.5 L output, with bicarbonate purge Continue amiodarone  infusion Continue heated high flow nasal cannula oxygen, high risk of endotracheal intubation Started on IV antibiotics with Unasyn  Follow-up respiratory culture Serum creatinine is at baseline, avoid nephrotoxic agent Repeat BMP and electrolytes in the morning Continue high-dose  statin Holding antihypertensive in the setting of shock Continue Flomax    Labs   CBC: Recent Labs  Lab 03/03/24 1505 03/03/24 1510 03/03/24 1542 03/03/24 1648 03/03/24 1649  WBC 11.3*  --   --   --   --   NEUTROABS 8.9*  --   --   --   --   HGB 12.6* 12.9*  12.9* 12.9* 13.3 13.6  HCT 38.2* 38.0*  38.0* 38.0* 39.0 40.0  MCV 96.5  --   --   --   --   PLT 185  --   --   --   --     Basic Metabolic Panel: Recent Labs  Lab 03/03/24 1505 03/03/24 1510 03/03/24 1542 03/03/24 1648 03/03/24 1649  NA 137 140  140 128* 135 135  K 3.6 3.7  3.6 3.5 4.2 4.2  CL 102 104  --   --   --   CO2 19*  --   --   --   --   GLUCOSE 145* 151*  --   --   --   BUN 30* 31*  --   --   --   CREATININE 1.49* 1.60*  --   --   --   CALCIUM  8.5*  --   --   --   --    GFR: Estimated Creatinine Clearance: 42.5 mL/min (A) (by C-G formula based on SCr of 1.6 mg/dL (H)). Recent Labs  Lab 03/03/24 1505 03/03/24 1510 03/03/24 1543  WBC 11.3*  --   --   LATICACIDVEN  --  1.1 1.7    Liver Function Tests: Recent Labs  Lab 03/03/24 1505  AST 21  ALT 19  ALKPHOS 34*  BILITOT 0.8  PROT 6.6  ALBUMIN  3.7   No results for input(s): LIPASE, AMYLASE in the last 168 hours. No results for input(s): AMMONIA in the last 168 hours.  ABG    Component Value Date/Time   PHART 7.446 03/03/2024 1648   PCO2ART 32.0 03/03/2024 1648   PO2ART 51 (L) 03/03/2024 1648   HCO3 24.9 03/03/2024 1649   TCO2 26 03/03/2024 1649   ACIDBASEDEF 1.0 03/03/2024 1648   O2SAT 48 03/03/2024 1649     Coagulation Profile: Recent Labs  Lab 03/03/24 1505  INR 1.0    Cardiac Enzymes: No results for input(s): CKTOTAL, CKMB, CKMBINDEX, TROPONINI in the last 168 hours.  HbA1C: Hgb A1c MFr Bld  Date/Time Value Ref Range Status  03/03/2024 03:05 PM 5.6 4.8 - 5.6 % Final    Comment:    (NOTE) Diagnosis of Diabetes The following HbA1c ranges recommended by the American Diabetes Association (ADA) [place]  be used as an aid in the diagnosis of diabetes mellitus.  Hemoglobin             Suggested A1C NGSP%              Diagnosis  <5.7                   Non Diabetic  5.7-6.4  Pre-Diabetic  >6.4                   Diabetic  <7.0                   Glycemic control for                       adults with diabetes.      CBG: No results for input(s): GLUCAP in the last 168 hours.  Review of Systems:   12 point review of system is significant for complaint mentioned in HPI, rest negative  Past Medical History:  He,  has a past medical history of Arthritis, Back pain, Benign prostatic hyperplasia with urinary obstruction and other lower urinary tract symptoms, Cancer (HCC), Dyslipidemia, Dysplastic nevus (08/13/2006), Dysplastic nevus (09/29/2006), Elevated PSA, Enlarged prostate, Family history of breast cancer, Family history of colon cancer, Family history of lung cancer, Family history of pancreatic cancer, Family history of prostate cancer, GERD (gastroesophageal reflux disease), Headache, HOH (hard of hearing), Hypercholesterolemia, Hypertension, Personal history of colon cancer (01/19/2018), and Scoliosis.   Surgical History:   Past Surgical History:  Procedure Laterality Date   COLONOSCOPY  08/27/2010   Dr. Gaylyn Tubular adenomas, FH polyps   COLONOSCOPY     COLONOSCOPY WITH PROPOFOL  N/A 11/19/2017   Procedure: COLONOSCOPY WITH PROPOFOL ;  Surgeon: Gaylyn Gladis PENNER, MD;  Location: [facility] ENDOSCOPY;  Service: Endoscopy;  Laterality: N/A;   COLONOSCOPY WITH PROPOFOL  N/A 04/29/2018   Procedure: COLONOSCOPY WITH PROPOFOL ;  Surgeon: Gaylyn Gladis PENNER, MD;  Location[facility] ENDOSCOPY;  Service: Endoscopy;  Laterality: N/A;   COLONOSCOPY WITH PROPOFOL  N/A 04/30/2018   Procedure: COLONOSCOPY WITH PROPOFOL ;  Surgeon: Gaylyn Gladis PENNER, MD;  Location: [facility] ENDOSCOPY;  Service: Endoscopy;  Laterality: N/A;   COLONOSCOPY WITH PROPOFOL  N/A 05/31/2018   Procedure: COLONOSCOPY WITH  PROPOFOL ;  Surgeon: Gaylyn Gladis PENNER, MD;  Location[facility] ENDOSCOPY;  Service: Endoscopy;  Laterality: N/A;   COLONOSCOPY WITH PROPOFOL  N/A 09/17/2018   Procedure: COLONOSCOPY WITH PROPOFOL ;  Surgeon: Gaylyn Gladis PENNER, MD;  Location: [facility] ENDOSCOPY;  Service: Endoscopy;  Laterality: N/A;   COLONOSCOPY WITH PROPOFOL  N/A 06/25/2020   Procedure: COLONOSCOPY WITH PROPOFOL ;  Surgeon: Maryruth Ole DASEN, MD;  Location: [facility] ENDOSCOPY;  Service: Endoscopy;  Laterality: N/A;   ESOPHAGOGASTRODUODENOSCOPY (EGD) WITH PROPOFOL  N/A 11/19/2017   Procedure: ESOPHAGOGASTRODUODENOSCOPY (EGD) WITH PROPOFOL ;  Surgeon: Gaylyn Gladis PENNER, MD;  Location: [facility] ENDOSCOPY;  Service: Endoscopy;  Laterality: N/A;   GAS INSERTION Left 09/20/2019   Procedure: INSERTION OF GAS;  Surgeon: Alvia Norleen BIRCH, MD;  Location: [facility] OR;  Service: Ophthalmology;  Laterality: Left;   LASER PHOTO ABLATION Left 09/20/2019   Procedure: Laser Photo Ablation;  Surgeon: Alvia Norleen BIRCH, MD;  Location: [facility] OR;  Service: Ophthalmology;  Laterality: Left;   MEMBRANE PEEL Left 09/20/2019   Procedure: Ottie Booty;  Surgeon: Alvia Norleen BIRCH, MD;  Location: [facility] OR;  Service: Ophthalmology;  Laterality: Left;   PARS PLANA VITRECTOMY W/ SCLERAL BUCKLE Left 09/20/2019   Procedure: 25 Gauge PARS PLANA VITRECTOMY WITH LASER FOR MACULAR HOLE;  Surgeon: Alvia Norleen BIRCH, MD;  Location[facility] OR;  Service: Ophthalmology;  Laterality: Left;   SERUM PATCH Left 09/20/2019   Procedure: SERUM PATCH;  Surgeon: Alvia Norleen BIRCH, MD;  Location: [facility] OR;  Service: Ophthalmology;  Laterality: Left;   TONSILLECTOMY     VITRECTOMY Left 09/20/2019    25 Gauge PARS PLANA VITRECTOMY WITH LASER FOR MACULAR  HOLE (Left Eye)     Social History:   reports that he has never smoked. He has never used smokeless tobacco. He reports that he does not currently use alcohol. He reports that he does not use drugs.   Family History:  His family history includes Aortic aneurysm in his maternal  grandfather; Breast cancer in his cousin, cousin, and cousin; Breast cancer (age of onset: 40) in his mother; Cirrhosis in his paternal uncle; Colon cancer (age of onset: 8) in his maternal uncle; Colon polyps in his father; Heart disease in his maternal grandfather; Kidney failure in his paternal grandmother; Lung cancer (age of onset: 46) in his paternal aunt; Lung cancer (age of onset: 31) in his father; Pancreatic cancer in his paternal uncle; Prostate cancer in his maternal uncle.   Allergies Allergies  Allergen Reactions   Sulfa Antibiotics Rash     Home Medications  Prior to Admission medications   Medication Sig Start Date End Date Taking? Authorizing Provider  acetaminophen  (TYLENOL ) 500 MG tablet Take 500 mg by mouth every 6 (six) hours as needed for mild pain or moderate pain.    [provider]  bacitracin -polymyxin b  (POLYSPORIN ) ophthalmic ointment Place 1 application into the left eye 3 (three) times daily. apply to eye every 12 hours while awake 09/21/19   Alvia Norleen BIRCH, MD  finasteride  (PROSCAR ) 5 MG tablet Take 5 mg by mouth daily.    [provider]  gatifloxacin  (ZYMAXID ) 0.5 % SOLN Place 1 drop into the left eye 4 (four) times daily. 09/21/19   Matthews, John D, MD  Glucosamine HCl 1500 MG TABS Take 1,500 mg by mouth daily. turmeric    [provider]  lisinopril -hydrochlorothiazide  (PRINZIDE ,ZESTORETIC ) 10-12.5 MG tablet Take 1 tablet by mouth daily.    [provider]  Multiple Vitamins-Minerals (MULTIVITAMIN WITH MINERALS) tablet Take 1 tablet by mouth daily.    [provider]  pantoprazole  (PROTONIX ) 40 MG tablet Take 40 mg by mouth daily.    [provider]  prednisoLONE  acetate (PRED FORTE ) 1 % ophthalmic suspension Place 1 drop into the left eye 4 (four) times daily. 09/21/19   Alvia Norleen BIRCH, MD  rosuvastatin  (CRESTOR ) 10 MG tablet Take 10 mg by mouth daily. 06/13/19 06/12/20  [provider]  tamsulosin  (FLOMAX) 0.4 MG CAPS capsule Take 0.4 mg by mouth daily.     [provider]     Critical care time:     The patient is critically ill due to acute STEMI/acute HFrEF with cardiogenic shock/acute respiratory failure with hypoxia.  Critical care was necessary to treat or prevent imminent or life-threatening deterioration.  Critical care was time spent personally by me on the following activities: development of treatment plan with patient and/or surrogate as well as nursing, discussions with consultants, evaluation of patient's response to treatment, examination of patient, obtaining history from patient or surrogate, ordering and performing treatments and interventions, ordering and review of laboratory studies, ordering and review of radiographic studies, pulse oximetry, re-evaluation of patient's condition and participation in multidisciplinary rounds.   During this encounter critical care time was devoted to patient care services described in this note for 44 minutes.     Valinda Novas, MD [facility] See Amion for pager If no response to pager, please call [phone] until 7pm After 7pm, Please call E-link [phone]

## 2024-03-03 NOTE — Progress Notes (Signed)
 PHARMACY - ANTICOAGULATION CONSULT NOTE  Pharmacy Consult for Heparin  Indication: Impella CP  Allergies  Allergen Reactions   Sulfa Antibiotics Rash    Patient Measurements: Height: 5' 10 (177.8 cm) Weight: 79.4 kg (175 lb) IBW/kg (Calculated) : 73  Vital Signs:    Labs: Recent Labs    03/03/24 1505 03/03/24 1510  HGB 12.6* 12.9*  HCT 38.2* 38.0*  PLT 185  --   APTT 27  --   LABPROT 13.7  --   INR 1.0  --   CREATININE 1.49*  --   TROPONINIHS 162*  --     Estimated Creatinine Clearance: 45.6 mL/min (A) (by C-G formula based on SCr of 1.49 mg/dL (H)).   Medical History: Past Medical History:  Diagnosis Date   Arthritis    osteo   Back pain    Benign prostatic hyperplasia with urinary obstruction and other lower urinary tract symptoms    Cancer (HCC)    maligant polyp   Dyslipidemia    Dysplastic nevus 08/13/2006   L chest pectoral - mod, excision 06.10.2008   Dysplastic nevus 09/29/2006   L groin - mild   Elevated PSA    Enlarged prostate    Family history of breast cancer    Family history of colon cancer    Family history of lung cancer    Family history of pancreatic cancer    Family history of prostate cancer    GERD (gastroesophageal reflux disease)    Headache    migraine without aura   HOH (hard of hearing)    Hypercholesterolemia    Hypertension    Personal history of colon cancer 01/19/2018   Scoliosis     Medications:  Scheduled:   [START ON 03/04/2024] aspirin   81 mg Oral Daily   furosemide   80 mg Intravenous Once   nitroGLYCERIN        rosuvastatin   20 mg Oral Daily   [START ON 03/04/2024] ticagrelor   90 mg Oral BID   Infusions:   sodium chloride  250 mL/hr (03/03/24 1522)   amiodarone  60 mg/hr (03/03/24 1543)   cangrelor  (KENGREAL ) 50,000 mcg in sodium chloride  0.9 % 250 mL (200 mcg/mL) infusion 4 mcg/kg/min (03/03/24 1541)   milrinone      milrinone  0.125 mcg/kg/min (03/03/24 1703)   norepinephrine  (LEVOPHED ) 4 mg in dextrose   5 % 250 mL (0.016 mg/mL) infusion 10 mL/hr at 03/03/24 1603   sodium bicarbonate  25 mEq (Impella PURGE) in dextrose  5 % 1000 mL bag     PRN: sodium chloride , amiodarone , amiodarone , cangrelor  (KENGREAL ) 50,000 mcg in sodium chloride  0.9 % 250 mL (200 mcg/mL) infusion, cangrelor , furosemide , Heparin  (Porcine) in NaCl, heparin  sodium (porcine), lidocaine  (PF), milrinone , nitroGLYCERIN , norepinephrine  (LEVOPHED ) 4 mg in dextrose  5 % 250 mL (0.016 mg/mL) infusion, ondansetron , Radial Cocktail/Verapamil  only  Assessment: 73 yo male admitted with anterior STEMI taking for emergent cath and placement of Impella CP. Pharmacy consulted for IV heparin  dosing to start 2hr after TR band removal. Patient not on anticoagulation PTA. Hgb 12.9, Plts162  Goal of Therapy:  Heparin  level 0.2-0.5 units/ml Monitor platelets by anticoagulation protocol: Yes   Plan:  2hr after TR band removed, begin IV heparin  infusion 800 units/hr (no bolus) Check heparin  level 8hr after starting and daily while on heparin  Continue to monitor H&H and platelets  Rocky Slade, PharmD, BCPS 03/03/2024,5:10 PM

## 2024-03-03 NOTE — Progress Notes (Signed)
  Echocardiogram 2D Echocardiogram has been performed.  Timothy Nicholson 03/03/2024, 6:16 PM

## 2024-03-04 ENCOUNTER — Telehealth (HOSPITAL_COMMUNITY): Payer: Self-pay | Admitting: Pharmacy Technician

## 2024-03-04 ENCOUNTER — Inpatient Hospital Stay (HOSPITAL_COMMUNITY)

## 2024-03-04 ENCOUNTER — Other Ambulatory Visit (HOSPITAL_COMMUNITY): Payer: Self-pay

## 2024-03-04 DIAGNOSIS — D62 Acute posthemorrhagic anemia: Secondary | ICD-10-CM | POA: Diagnosis not present

## 2024-03-04 DIAGNOSIS — Z95811 Presence of heart assist device: Secondary | ICD-10-CM | POA: Diagnosis not present

## 2024-03-04 DIAGNOSIS — J9601 Acute respiratory failure with hypoxia: Secondary | ICD-10-CM | POA: Diagnosis not present

## 2024-03-04 DIAGNOSIS — R112 Nausea with vomiting, unspecified: Secondary | ICD-10-CM

## 2024-03-04 DIAGNOSIS — N179 Acute kidney failure, unspecified: Secondary | ICD-10-CM

## 2024-03-04 DIAGNOSIS — J690 Pneumonitis due to inhalation of food and vomit: Secondary | ICD-10-CM | POA: Diagnosis not present

## 2024-03-04 DIAGNOSIS — D696 Thrombocytopenia, unspecified: Secondary | ICD-10-CM

## 2024-03-04 DIAGNOSIS — I2102 ST elevation (STEMI) myocardial infarction involving left anterior descending coronary artery: Secondary | ICD-10-CM | POA: Diagnosis not present

## 2024-03-04 DIAGNOSIS — R570 Cardiogenic shock: Secondary | ICD-10-CM | POA: Diagnosis not present

## 2024-03-04 DIAGNOSIS — K7200 Acute and subacute hepatic failure without coma: Secondary | ICD-10-CM

## 2024-03-04 LAB — CBC
HCT: 29.3 % — ABNORMAL LOW (ref 39.0–52.0)
HCT: 28.9 % — ABNORMAL LOW (ref 39.0–52.0)
HCT: 27.8 % — ABNORMAL LOW (ref 39.0–52.0)
Hemoglobin: 10.3 g/dL — ABNORMAL LOW (ref 13.0–17.0)
Hemoglobin: 10.2 g/dL — ABNORMAL LOW (ref 13.0–17.0)
Hemoglobin: 9.7 g/dL — ABNORMAL LOW (ref 13.0–17.0)
MCH: 32 pg (ref 26.0–34.0)
MCH: 31.9 pg (ref 26.0–34.0)
MCH: 31.3 pg (ref 26.0–34.0)
MCHC: 35.2 g/dL (ref 30.0–36.0)
MCHC: 35.3 g/dL (ref 30.0–36.0)
MCHC: 34.9 g/dL (ref 30.0–36.0)
MCV: 91 fL (ref 80.0–100.0)
MCV: 90.3 fL (ref 80.0–100.0)
MCV: 89.7 fL (ref 80.0–100.0)
Platelets: 136 K/uL — ABNORMAL LOW (ref 150–400)
Platelets: 124 K/uL — ABNORMAL LOW (ref 150–400)
Platelets: 111 K/uL — ABNORMAL LOW (ref 150–400)
RBC: 3.22 MIL/uL — ABNORMAL LOW (ref 4.22–5.81)
RBC: 3.2 MIL/uL — ABNORMAL LOW (ref 4.22–5.81)
RBC: 3.1 MIL/uL — ABNORMAL LOW (ref 4.22–5.81)
RDW: 14.6 % (ref 11.5–15.5)
RDW: 14.8 % (ref 11.5–15.5)
RDW: 14.9 % (ref 11.5–15.5)
WBC: 10.7 K/uL — ABNORMAL HIGH (ref 4.0–10.5)
WBC: 11.4 K/uL — ABNORMAL HIGH (ref 4.0–10.5)
WBC: 12.9 K/uL — ABNORMAL HIGH (ref 4.0–10.5)
nRBC: 0 % (ref 0.0–0.2)
nRBC: 0 % (ref 0.0–0.2)
nRBC: 0 % (ref 0.0–0.2)

## 2024-03-04 LAB — GLUCOSE, CAPILLARY
Glucose-Capillary: 252 mg/dL — ABNORMAL HIGH (ref 70–99)
Glucose-Capillary: 200 mg/dL — ABNORMAL HIGH (ref 70–99)
Glucose-Capillary: 149 mg/dL — ABNORMAL HIGH (ref 70–99)
Glucose-Capillary: 157 mg/dL — ABNORMAL HIGH (ref 70–99)
Glucose-Capillary: 175 mg/dL — ABNORMAL HIGH (ref 70–99)

## 2024-03-04 LAB — COOXEMETRY PANEL
Carboxyhemoglobin: 0.9 % (ref 0.5–1.5)
Methemoglobin: 1 % (ref 0.0–1.5)
O2 Saturation: 57 %
Total hemoglobin: 9.7 g/dL — ABNORMAL LOW (ref 12.0–16.0)

## 2024-03-04 LAB — BASIC METABOLIC PANEL WITH GFR
Anion gap: 12 (ref 5–15)
BUN: 31 mg/dL — ABNORMAL HIGH (ref 8–23)
CO2: 22 mmol/L (ref 22–32)
Calcium: 7.4 mg/dL — ABNORMAL LOW (ref 8.9–10.3)
Chloride: 102 mmol/L (ref 98–111)
Creatinine, Ser: 2.21 mg/dL — ABNORMAL HIGH (ref 0.61–1.24)
GFR, Estimated: 31 mL/min — ABNORMAL LOW (ref >60)
Glucose, Bld: 154 mg/dL — ABNORMAL HIGH (ref 70–99)
Potassium: 3.4 mmol/L — ABNORMAL LOW (ref 3.5–5.1)
Sodium: 136 mmol/L (ref 135–145)

## 2024-03-04 LAB — COMPREHENSIVE METABOLIC PANEL WITH GFR
ALT: 68 U/L — ABNORMAL HIGH (ref 0–44)
AST: 360 U/L — ABNORMAL HIGH (ref 15–41)
Albumin: 3.2 g/dL — ABNORMAL LOW (ref 3.5–5.0)
Alkaline Phosphatase: 25 U/L — ABNORMAL LOW (ref 38–126)
Anion gap: 15 (ref 5–15)
BUN: 30 mg/dL — ABNORMAL HIGH (ref 8–23)
CO2: 18 mmol/L — ABNORMAL LOW (ref 22–32)
Calcium: 7.4 mg/dL — ABNORMAL LOW (ref 8.9–10.3)
Chloride: 102 mmol/L (ref 98–111)
Creatinine, Ser: 2.15 mg/dL — ABNORMAL HIGH (ref 0.61–1.24)
GFR, Estimated: 32 mL/min — ABNORMAL LOW (ref >60)
Glucose, Bld: 284 mg/dL — ABNORMAL HIGH (ref 70–99)
Potassium: 3.6 mmol/L (ref 3.5–5.1)
Sodium: 135 mmol/L (ref 135–145)
Total Bilirubin: 0.9 mg/dL (ref 0.0–1.2)
Total Protein: 5.2 g/dL — ABNORMAL LOW (ref 6.5–8.1)

## 2024-03-04 LAB — LIPID PANEL
Cholesterol: 95 mg/dL (ref 0–200)
HDL: 41 mg/dL (ref >40)
LDL Cholesterol: 45 mg/dL (ref 0–99)
Total CHOL/HDL Ratio: 2.3 ratio
Triglycerides: 43 mg/dL (ref <150)
VLDL: 9 mg/dL (ref 0–40)

## 2024-03-04 LAB — CG4 I-STAT (LACTIC ACID)
Lactic Acid, Venous: 2.6 mmol/L (ref 0.5–1.9)
Lactic Acid, Venous: 1.6 mmol/L (ref 0.5–1.9)

## 2024-03-04 LAB — ECHOCARDIOGRAM LIMITED
Height: 70 in
Weight: 3139.35 [oz_av]

## 2024-03-04 LAB — MAGNESIUM
Magnesium: 2.1 mg/dL (ref 1.7–2.4)
Magnesium: 2.1 mg/dL (ref 1.7–2.4)

## 2024-03-04 LAB — LACTATE DEHYDROGENASE: LDH: 739 U/L — ABNORMAL HIGH (ref 105–235)

## 2024-03-04 LAB — HEMOGLOBIN AND HEMATOCRIT, BLOOD
HCT: 30.5 % — ABNORMAL LOW (ref 39.0–52.0)
Hemoglobin: 10.6 g/dL — ABNORMAL LOW (ref 13.0–17.0)

## 2024-03-04 MED ORDER — FUROSEMIDE 10 MG/ML IJ SOLN
40.0000 mg | Freq: Once | INTRAMUSCULAR | Status: AC
  Administered 2024-03-04: 40 mg via INTRAVENOUS
  Filled 2024-03-04: qty 4

## 2024-03-04 MED ORDER — MELATONIN 3 MG PO TABS
3.0000 mg | ORAL_TABLET | Freq: Every evening | ORAL | Status: DC | PRN
  Administered 2024-03-04 – 2024-03-16 (×2): 3 mg via ORAL
  Filled 2024-03-04 (×2): qty 1

## 2024-03-04 MED ORDER — TICAGRELOR 90 MG PO TABS
180.0000 mg | ORAL_TABLET | Freq: Once | ORAL | Status: AC
  Administered 2024-03-04: 180 mg via ORAL
  Filled 2024-03-04: qty 2

## 2024-03-04 MED ORDER — HYDROMORPHONE HCL 1 MG/ML IJ SOLN
0.5000 mg | INTRAMUSCULAR | Status: DC | PRN
  Administered 2024-03-12 – 2024-03-15 (×2): 0.5 mg via INTRAVENOUS
  Filled 2024-03-04 (×2): qty 0.5

## 2024-03-04 MED ORDER — METOCLOPRAMIDE HCL 5 MG/ML IJ SOLN
10.0000 mg | Freq: Once | INTRAMUSCULAR | Status: AC
  Administered 2024-03-04: 10 mg via INTRAVENOUS
  Filled 2024-03-04: qty 2

## 2024-03-04 MED ORDER — DOBUTAMINE-DEXTROSE 4-5 MG/ML-% IV SOLN
4.0000 ug/kg/min | INTRAVENOUS | Status: DC
  Administered 2024-03-04 – 2024-03-06 (×2): 4 ug/kg/min via INTRAVENOUS
  Filled 2024-03-04 (×3): qty 250

## 2024-03-04 MED ORDER — ORAL CARE MOUTH RINSE
15.0000 mL | OROMUCOSAL | Status: DC
  Administered 2024-03-04 – 2024-03-09 (×19): 15 mL via OROMUCOSAL

## 2024-03-04 MED ORDER — SCOPOLAMINE 1 MG/3DAYS TD PT72
1.0000 | MEDICATED_PATCH | TRANSDERMAL | Status: DC
  Administered 2024-03-04: 1 mg via TRANSDERMAL
  Filled 2024-03-04: qty 1

## 2024-03-04 MED ORDER — TICAGRELOR 90 MG PO TABS
90.0000 mg | ORAL_TABLET | Freq: Two times a day (BID) | ORAL | Status: DC
  Administered 2024-03-04 – 2024-03-17 (×26): 90 mg via ORAL
  Filled 2024-03-04 (×26): qty 1

## 2024-03-04 MED ORDER — CHLORHEXIDINE GLUCONATE CLOTH 2 % EX PADS
6.0000 | MEDICATED_PAD | Freq: Every day | CUTANEOUS | Status: DC
  Administered 2024-03-04 – 2024-03-17 (×14): 6 via TOPICAL

## 2024-03-04 MED ORDER — INSULIN ASPART 100 UNIT/ML IJ SOLN
0.0000 [IU] | INTRAMUSCULAR | Status: DC
  Administered 2024-03-04 (×2): 3 [IU] via SUBCUTANEOUS
  Administered 2024-03-04: 8 [IU] via SUBCUTANEOUS
  Administered 2024-03-04: 3 [IU] via SUBCUTANEOUS
  Administered 2024-03-04: 2 [IU] via SUBCUTANEOUS
  Administered 2024-03-05 (×2): 3 [IU] via SUBCUTANEOUS
  Administered 2024-03-05: 2 [IU] via SUBCUTANEOUS
  Administered 2024-03-05 (×2): 3 [IU] via SUBCUTANEOUS
  Administered 2024-03-06: 2 [IU] via SUBCUTANEOUS
  Administered 2024-03-06 (×2): 3 [IU] via SUBCUTANEOUS
  Administered 2024-03-06: 2 [IU] via SUBCUTANEOUS
  Administered 2024-03-06: 3 [IU] via SUBCUTANEOUS
  Administered 2024-03-07: 2 [IU] via SUBCUTANEOUS
  Administered 2024-03-07 (×2): 3 [IU] via SUBCUTANEOUS
  Administered 2024-03-07 – 2024-03-09 (×8): 2 [IU] via SUBCUTANEOUS
  Filled 2024-03-04: qty 3
  Filled 2024-03-04 (×4): qty 2
  Filled 2024-03-04: qty 3
  Filled 2024-03-04: qty 2
  Filled 2024-03-04: qty 3
  Filled 2024-03-04: qty 2
  Filled 2024-03-04: qty 8
  Filled 2024-03-04: qty 3
  Filled 2024-03-04 (×2): qty 2
  Filled 2024-03-04: qty 3
  Filled 2024-03-04: qty 2
  Filled 2024-03-04 (×4): qty 3
  Filled 2024-03-04 (×2): qty 2
  Filled 2024-03-04: qty 3
  Filled 2024-03-04: qty 2
  Filled 2024-03-04: qty 3
  Filled 2024-03-04: qty 2
  Filled 2024-03-04: qty 3

## 2024-03-04 MED ORDER — AMIODARONE HCL IN DEXTROSE 360-4.14 MG/200ML-% IV SOLN
30.0000 mg/h | INTRAVENOUS | Status: DC
  Administered 2024-03-04 – 2024-03-11 (×15): 30 mg/h via INTRAVENOUS
  Filled 2024-03-04 (×12): qty 200

## 2024-03-04 MED ORDER — POTASSIUM CHLORIDE 10 MEQ/100ML IV SOLN
10.0000 meq | INTRAVENOUS | Status: AC
  Administered 2024-03-04 (×2): 10 meq via INTRAVENOUS
  Filled 2024-03-04 (×2): qty 100

## 2024-03-04 MED ORDER — ORAL CARE MOUTH RINSE
15.0000 mL | OROMUCOSAL | Status: DC | PRN
Start: 2024-03-04 — End: 2024-03-11

## 2024-03-04 MED ORDER — POTASSIUM CHLORIDE CRYS ER 20 MEQ PO TBCR
40.0000 meq | EXTENDED_RELEASE_TABLET | Freq: Once | ORAL | Status: AC
  Administered 2024-03-04: 40 meq via ORAL
  Filled 2024-03-04: qty 2

## 2024-03-04 NOTE — TOC Initial Note (Signed)
 Transition of Care [facility]) - Initial/Assessment Note    Patient Details  Name: Timothy Nicholson MRN: [id number] Date of Birth: 1950/08/09  Transition of Care [facility]) CM/SW Contact:    Justina Delcia Czar, RN Phone Number:[phone] 03/04/2024, 6:23 PM  Clinical Narrative:                 Spoke to pt and states he was independent pta. Wife at home to assist with care. Pt has no history of DME/HH.   Will continue to follow for dc needs.   Will need PCP hospital follow up appt.   Expected Discharge Plan: Home/Self Care Barriers to Discharge: Continued Medical Work up   Patient Goals and CMS Choice Patient states their goals for this hospitalization and ongoing recovery are:: wants to recover          Expected Discharge Plan and Services   Discharge Planning Services: CM Consult   Living arrangements for the past 2 months: Single Family Home                   Prior Living Arrangements/Services Living arrangements for the past 2 months: Single Family Home Lives with:: Spouse Patient language and need for interpreter reviewed:: Yes Do you feel safe going back to the place where you live?: Yes      Need for Family Participation in Patient Care: No (Comment) Care giver support system in place?: Yes (comment)   Criminal Activity/Legal Involvement Pertinent to Current Situation/Hospitalization: No - Comment as needed  Activities of Daily Living      Permission Sought/Granted Permission sought to share information with : Case Manager, Family Supports, PCP Permission granted to share information with : Yes, Verbal Permission Granted  Share Information with NAME: Debora Mabee  Permission granted to share info w AGENCY: PcP, DME  Permission granted to share info w Relationship: wife  Permission granted to share info w Contact Information: [phone]  Emotional Assessment Appearance:: Appears stated age Attitude/Demeanor/Rapport: Engaged Affect (typically observed):  Accepting Orientation: : Oriented to Self, Oriented to Place, Oriented to  Time, Oriented to Situation   Psych Involvement: No (comment)  Admission diagnosis:  STEMI involving left anterior descending coronary artery (HCC) [I21.02] Patient Active Problem List   Diagnosis Date Noted   STEMI involving left anterior descending coronary artery (HCC) 03/03/2024   Cardiogenic shock (HCC) 03/03/2024   Macular hole, left eye 09/20/2019   Macular hole, left 09/05/2019   Genetic testing 02/01/2018   Personal history of colon cancer 01/19/2018   Family history of colon cancer    Family history of lung cancer    Family history of breast cancer    Family history of prostate cancer    Family history of pancreatic cancer    PCP:  [name] ORN, MD Pharmacy:   [facility] [phone] [address], [place] - [address] [place] [address] Phone: [phone] Fax: [phone]     Social Drivers of Health (SDOH) Social History: SDOH Screenings   Food Insecurity: No Food Insecurity (03/04/2024)  Housing: Low Risk  (03/04/2024)  Transportation Needs: No Transportation Needs (03/04/2024)  Utilities: Not At Risk (03/04/2024)  Financial Resource Strain: Low Risk  (08/04/2023)   Received from [facility]  Tobacco Use: Low Risk  (03/03/2024)   SDOH Interventions:     Readmission Risk Interventions     No data to display

## 2024-03-04 NOTE — Progress Notes (Signed)
 Reassessment of hemodynamics  Current support: Impella @ P7, flow 3.3L/min + DBA 4, NE 11, VP 0.01.  CVP 2 PAP 29/9 CO 4.62 CI 2.34 (improved from 1.9)  PAPi 10 SVR 1091 CPO 0.7   LVEDP on Impella 16  MAP 65  LA 2.6>>1.6   He has been diuresing well w/ IV Lasix .   Repeat Hgb stable 10.3>>10.2. Impella access site stable.   Pt reports feeling better. Denies any current LBP. No chest pain or dyspnea.   Hold IV diuretics for now. Will follow closely.   Caffie Shed, PA-C

## 2024-03-04 NOTE — Telephone Encounter (Signed)
 Patient Product/process Development Scientist completed.    The patient is insured through [facility]. Patient has Medicare and is not eligible for a copay card, but [place] be able to apply for patient assistance or Medicare RX Payment Plan (Patient Must reach out to their plan, if eligible for payment plan), if available.    Ran test claim for ticagrelor  (Brilinta ) 90 mg and the current 30 day co-pay is $45.00.   This test claim was processed through [facility]- copay amounts Speak vary at other pharmacies due to pharmacy/plan contracts, or as the patient moves through the different stages of their insurance plan.     Reyes Sharps, CPHT Pharmacy Technician Patient Advocate Specialist Lead [facility] Patient Advocate Team Direct Number: [phone]  Fax: [phone]

## 2024-03-04 NOTE — Progress Notes (Addendum)
 Patient ID: Timothy Nicholson, male   DOB: [birth year], 73 y.o.   MRN: [id number]     Advanced Heart Failure Rounding Note  Cardiologist: None  Chief Complaint: CHF Subjective:    He feels better this morning than last night but still has periodic dry heaving and does not think he can take oral medication.   IV cangrelor  is ongoing as he has not had Brilinta .  Getting anti-nausea meds regularly.   Large hematoma at Impella access site noted last night, prolonged pressure held and now improved.  CTA abdomen/pelvis showed a right retroperitoneal hematoma without active arterial extravasation of contrast and a small hematoma at access site.  Patient received 2 units PRBCs for bleeding. Hgb 10.3 this morning.   Bilateral lower lobe airspace disease, on Unasyn  for possible aspiration. Tm 100.   Amiodarone  gtt continues, patient in NSR.   Impella CP: P7 Flow 3.2 LDH 739 No heparin  gtt, just HCO3 purge.   Swan: CVP 5 PA 29/14 CI 1.92 Thermo Co-ox 57% Lactate 2.5   Objective:   Weight Range: 89 kg Body mass index is 28.15 kg/m.   Vital Signs:   Temp:  [98.1 F (36.7 C)-100.6 F (38.1 C)] 99.5 F (37.5 C) (11/21 0806) Pulse Rate:  [0-192] 84 (11/21 0806) Resp:  [9-32] 17 (11/21 0806) BP: (55-156)/(46-111) 123/73 (11/21 0800) SpO2:  [70 %-100 %] 99 % (11/21 0806) Arterial Line BP: (61-129)/(43-70) 88/54 (11/21 0806) FiO2 (%):  [50 %-100 %] 50 % (11/21 0806) Weight:  [79.4 kg-89 kg] 89 kg (11/21 0500) Last BM Date :  (PTA)  Weight change: Filed Weights   03/03/24 1400 03/04/24 0500  Weight: 79.4 kg 89 kg    Intake/Output:   Intake/Output Summary (Last 24 hours) at 03/04/2024 0836 Last data filed at 03/04/2024 0800 Gross per 24 hour  Intake 6700.19 ml  Output 1960 ml  Net 4740.19 ml      Physical Exam    General:  Well appearing. No resp difficulty HEENT: Normal Neck: Supple. JVP not elevated. Carotids 2+ bilat; no bruits. No lymphadenopathy or thyromegaly  appreciated. Cor: PMI nondisplaced. Regular rate & rhythm. No rubs, gallops or murmurs. Lungs: Clear Abdomen: Soft, nontender, nondistended. No hepatosplenomegaly. No bruits or masses. Good bowel sounds. Extremities: No cyanosis, clubbing, rash, edema. Impella right groin.  Neuro: Alert & orientedx3, cranial nerves grossly intact. moves all 4 extremities w/o difficulty. Affect pleasant   Telemetry   NSR 90s (personally reviewed)  Labs    CBC Recent Labs    03/03/24 1505 03/03/24 1510 03/03/24 2112 03/04/24 0045 03/04/24 0500  WBC 11.3*   < > 15.9*  --  10.7*  NEUTROABS 8.9*  --  13.9*  --   --   HGB 12.6*   < > 9.5* 10.6* 10.3*  HCT 38.2*   < > 29.0* 30.5* 29.3*  MCV 96.5   < > 99.3  --  91.0  PLT 185   < > 192  --  136*   < > = values in this interval not displayed.   Basic Metabolic Panel Recent Labs    88/79/74 2155 03/04/24 0500  NA 136 135  K 3.7 3.6  CL 103 102  CO2 15* 18*  GLUCOSE 363* 284*  BUN 31* 30*  CREATININE 2.09* 2.15*  CALCIUM  7.5* 7.4*  MG 1.5* 2.1   Liver Function Tests Recent Labs    03/03/24 1505 03/04/24 0500  AST 21 360*  ALT 19 68*  ALKPHOS 34* 25*  BILITOT 0.8 0.9  PROT 6.6 5.2*  ALBUMIN  3.7 3.2*   No results for input(s): LIPASE, AMYLASE in the last 72 hours. Cardiac Enzymes No results for input(s): CKTOTAL, CKMB, CKMBINDEX, TROPONINI in the last 72 hours.  BNP: BNP (last 3 results) No results for input(s): BNP in the last 8760 hours.  ProBNP (last 3 results) No results for input(s): PROBNP in the last 8760 hours.   D-Dimer No results for input(s): DDIMER in the last 72 hours. Hemoglobin A1C Recent Labs    03/03/24 1505  HGBA1C 5.6   Fasting Lipid Panel Recent Labs    03/04/24 0500  CHOL 95  HDL 41  LDLCALC 45  TRIG 43  CHOLHDL 2.3   Thyroid Function Tests No results for input(s): TSH, T4TOTAL, T3FREE, THYROIDAB in the last 72 hours.  Invalid input(s): FREET3  Other  results:   Imaging    CT Angio Abd/Pel w/ and/or w/o Result Date: 03/03/2024 CLINICAL DATA:  Retroperitoneal bleed suspected. Hemorrhagic shock. Right common femoral artery Impella placed. EXAM: CTA ABDOMEN AND PELVIS WITHOUT AND WITH CONTRAST TECHNIQUE: Multidetector CT imaging of the abdomen and pelvis was performed using the standard protocol during bolus administration of intravenous contrast. Multiplanar reconstructed images and MIPs were obtained and reviewed to evaluate the vascular anatomy. RADIATION DOSE REDUCTION: This exam was performed according to the departmental dose-optimization program which includes automated exposure control, adjustment of the mA and/or kV according to patient size and/or use of iterative reconstruction technique. CONTRAST:  75mL OMNIPAQUE  IOHEXOL  350 MG/ML SOLN COMPARISON:  None Available. FINDINGS: VASCULAR Aorta: Normal caliber aorta without aneurysm, dissection, vasculitis or significant stenosis. Impella catheter seen throughout the aorta extending into the lower chest. There is calcified atherosclerotic disease throughout the aorta. Celiac: Patent without evidence of aneurysm, dissection, vasculitis or significant stenosis. SMA: Patent without evidence of aneurysm, dissection, vasculitis or significant stenosis. Renals: Both renal arteries are patent without evidence of aneurysm, dissection, vasculitis, fibromuscular dysplasia or significant stenosis. Accessory left renal artery present. IMA: Patent without evidence of aneurysm, dissection, vasculitis or significant stenosis. Inflow: Patent without evidence of aneurysm, dissection, vasculitis or significant stenosis. Right-sided Impella catheter seen throughout. Proximal Outflow: Bilateral common femoral and visualized portions of the superficial and profunda femoral arteries are patent without evidence of aneurysm, dissection, vasculitis or significant stenosis. Veins: No obvious venous abnormality within the  limitations of this arterial phase study. Review of the MIP images confirms the above findings. NON-VASCULAR Lower chest: There is airspace consolidation throughout the bilateral lower lobes with air bronchograms. Hepatobiliary: There is mild gallbladder wall edema. There is likely excreted contrast throughout the gallbladder. There is no biliary ductal dilatation. There is a rounded hypodensity in the liver which is too small to characterize, likely a cyst. Pancreas: Unremarkable. No pancreatic ductal dilatation or surrounding inflammatory changes. Spleen: Normal in size without focal abnormality. Adrenals/Urinary Tract: The bladder is decompressed by Foley catheter. There is no hydronephrosis or perinephric fat stranding. There is a heterogeneous lesion containing macroscopic fat in the superior pole the right kidney measuring 1.8 x 2.8 cm. The adrenal glands are within normal limits. Stomach/Bowel: There is diffuse colonic wall thickening versus normal under distention. There is no surrounding inflammation. The appendix is not definitively visualized. There is a large amount of stool in the rectosigmoid colon. No bowel obstruction, pneumatosis or free air. There is a small hiatal hernia. Lymphatic: No enlarged lymph nodes are seen. Reproductive: Prostate gland is heterogeneous and enlarged. Other: There is right retroperitoneal hematoma extending  from the inferior pole of the right kidney into the right hemipelvis (moderate amount). There are no areas of active arterial extravasation of contrast. Additionally there is a small amount of extraperitoneal anterior right pelvic hematoma as well as hematoma in the right groin extending into the anterior right thigh. There is no free intraperitoneal air or hernia identified. Musculoskeletal: No fracture is seen. IMPRESSION: 1. No evidence for arterial dissection or aneurysm. Right common femoral catheter extends into the aorta. 2. Moderate right retroperitoneal hematoma  extending from the inferior pole of the right kidney into the right hemipelvis. No evidence for active arterial extravasation of contrast. 3. Small amount of extraperitoneal hematoma in the anterior right pelvis and right groin extending into the anterior right thigh. 4. Diffuse colonic wall thickening versus normal under distention. Correlate clinically for colitis. 5. Mild gallbladder wall edema.  Correlate clinically. 6. Bilateral lower lobe airspace consolidation worrisome for pneumonia. 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 8. Aortic atherosclerosis. Aortic Atherosclerosis (ICD10-I70.0). Electronically Signed   By: Greig Pique M.D.   On: 03/03/2024 23:27   ECHOCARDIOGRAM LIMITED Result Date: 03/03/2024    ECHOCARDIOGRAM LIMITED REPORT   Patient Name:   DAMETRIUS SANJUAN Galvao Date of Exam: 03/03/2024 Medical Rec #:  [id number]   Height:       70.0 in Accession #:    [id number]  Weight:       175.0 lb Date of Birth:  [birth year]  BSA:          1.972 m Patient Age:    73 years    BP:           98/76 mmHg Patient Gender: M           HR:           80 bpm. Exam Location:  Inpatient Procedure: 2D Echo (Both Spectral and Color Flow Doppler were utilized during            procedure). STAT ECHO Indications:    impella placement check  History:        Patient has no prior history of Echocardiogram examinations.                 CAD.  Sonographer:    Tinnie Barefoot RDCS Referring Phys: [id number] Sue Fernicola S Mark Hassey IMPRESSIONS  1. Echo for Impella placement. Final Impella depth 3.32 cm in LV.  2. Left ventricular ejection fraction, by estimation, is 30%. The left ventricle has moderately decreased function. The left ventricle demonstrates regional wall motion abnormalities (see scoring diagram/findings for description).  3. Right ventricular systolic function is mildly reduced. The right ventricular size is normal. FINDINGS  Left Ventricle: Left ventricular ejection fraction, by estimation, is 30%. The left ventricle has  moderately decreased function. The left ventricle demonstrates regional wall motion abnormalities.  LV Wall Scoring: The mid and distal anterior septum, apical lateral segment, and mid inferoseptal segment are akinetic. The mid inferolateral segment and mid anterolateral segment are hypokinetic. Right Ventricle: The right ventricular size is normal. Right ventricular systolic function is mildly reduced. Additional Comments: Color Doppler performed.  Soyla Merck MD Electronically signed by Soyla Merck MD Signature Date/Time: 03/03/2024/11:13:13 PM    Final    DG CHEST PORT 1 VIEW Result Date: 03/03/2024 CLINICAL DATA:  Atelectasis STEMI. EXAM: PORTABLE CHEST 1 VIEW COMPARISON:  Chest radiograph dated 06/19/2010. FINDINGS: Right-sided Swan-Ganz with tip in the right pulmonary artery. An Impella device is noted with tip over the cardiac apex. Diffuse interstitial densities  and Kerley B-lines most consistent with edema. Pneumonia is not excluded. No pleural effusion or thorax. The cardiac silhouette is within normal limits no acute osseous pathology. IMPRESSION: 1. Support devices as above. 2. Interstitial edema. Electronically Signed   By: Vanetta Chou M.D.   On: 03/03/2024 18:05   CARDIAC CATHETERIZATION Result Date: 03/03/2024 1.  Acute STEMI involving the LAD, treated with stenting of the LAD and PTCA of the first diagonal 2.  Patent left main and left circumflex 3.  Probable occlusion of the proximal RCA, vessel not well-visualized 4.  Severe cardiogenic shock requiring insertion of an Impella CP device via right femoral artery access Recommendations: CV-ICU care.  Appreciate advanced heart failure consultation and management during the procedure.  Continue IV cangrelor  until orally loaded with ticagrelor .  Recommend DAPT with aspirin  and ticagrelor  x 12 months without interruption if possible.  Otherwise continue current level of support.  Family updated in person.     Medications:      Scheduled Medications:  sodium chloride    Intravenous Once   sodium chloride    Intravenous Once   aspirin   81 mg Oral Daily   finasteride   5 mg Oral Daily   insulin  aspart  0-15 Units Subcutaneous Q4H   metoCLOPramide  (REGLAN ) injection  10 mg Intravenous Once   mouth rinse  15 mL Mouth Rinse 4 times per day   pantoprazole   40 mg Oral Daily   rosuvastatin   20 mg Oral Daily   scopolamine   1 patch Transdermal Q72H   sodium chloride  flush  3 mL Intravenous Q12H   tamsulosin   0.4 mg Oral Daily    Infusions:  sodium chloride      amiodarone      ampicillin -sulbactam (UNASYN ) IV Stopped (03/04/24 0505)   cangrelor  (KENGREAL ) 50 mg in sodium chloride  0.9 % 250 mL (0.2 mg/mL) infusion 0.75 mcg/kg/min (03/04/24 0800)   DOBUTamine  4 mcg/kg/min (03/04/24 0800)   norepinephrine  (LEVOPHED ) Adult infusion 10 mcg/min (03/04/24 0800)   potassium chloride      promethazine  (PHENERGAN ) injection (IM or IVPB) Stopped (03/03/24 2233)   sodium bicarbonate  25 mEq (Impella PURGE) in dextrose  5 % 1000 mL bag     vasopressin  0.03 Units/min (03/04/24 0800)    PRN Medications: sodium chloride , acetaminophen , HYDROmorphone  (DILAUDID ) injection, iohexol , metoCLOPramide  (REGLAN ) injection, ondansetron  (ZOFRAN ) IV, ondansetron  (ZOFRAN ) IV, mouth rinse, oxyCODONE , promethazine  (PHENERGAN ) injection (IM or IVPB), sodium chloride  flush     Assessment/Plan   1. CAD: Anterior STEMI with occluded proximal LAD; the RCA was not selectively engaged but appeared to be relatively small with proximal occlusion. Now s/p DES to proximal LAD and PTCA to ostial D1.  - Still getting IV cangrelor  due to nausea/dry heaving, seems to be improving this morning.  Will add scopolamine  patch and try to transition to ticagrelor  + ASA 81 and stop cangrelor  when he can take po.  - Crestor  2. Cardiogenic shock: Due to anterior MI.  Limited echo with Impella showed EF 25% by my read with LAD territory akinesis, normal RV size/mildly  decreased systolic function.  Patient currently on dobutamine  4, vasopressin  0.03, and NE 11 with CVP 5, CI 1.9 by thermo with co-ox 57%.  Pressor requirement increased with active bleeding overnight (RP hematoma and superficial Impella site hematoma) but BP now improved.  Creatinine higher at 2.1.   - Continue Impella at P7, no further suction after volume repletion (PRBCs).  Will get echo to check position => Impella deep, moved back under echo guidance to about 4 cm.  Will not wean today with ongoing significant pressor/inotrope requirement.  - Increase dobutamine  to 4 mcg/kg/min with marginal cardiac output.  - Can wean NE/vasopressin  as able, hopefully with stabilized bleeding this will be possible.  - No heparin  gtt, using HCO3 purge only for Impella.  - CVP 5 and bleeding, no Lasix .  3. AKI on CKD stage 3: Creatinine 1.6 => 2.1 with cardiogenic/hemorrhagic shock. Also had contrast loads with cath and CTA.   - Follow closely, support CO.   4. VT: Peri-STEMI. - Continue amiodarone  gtt 30 mg/hr for now.  5. Heme: Hemorrhagic component to shock overnight with hematoma at Impella access site and RP hematoma.  CTA abdomen this morning showed RP hematoma without active extravasation.  - No heparin  as above (HCO3 purge).  - Need to get him off cangrelor  as soon as possible.  - Transfuse hgb < 8. Follow CBC today.  6. Pulmonary: Possible aspiration PNA.  - Continue Unasyn .   CRITICAL CARE Performed by: Ezra Shuck  Total critical care time: 45 minutes  Critical care time was exclusive of separately billable procedures and treating other patients.  Critical care was necessary to treat or prevent imminent or life-threatening deterioration.  Critical care was time spent personally by me on the following activities: development of treatment plan with patient and/or surrogate as well as nursing, discussions with consultants, evaluation of patient's response to treatment, examination of patient,  obtaining history from patient or surrogate, ordering and performing treatments and interventions, ordering and review of laboratory studies, ordering and review of radiographic studies, pulse oximetry and re-evaluation of patient's condition.     Length of Stay: 1  Ezra Shuck, MD  03/04/2024, 8:36 AM  Advanced Heart Failure Team Pager [phone] (M-F; 7a - 5p)  Please contact [facility] for night-coverage after hours (5p -7a ) and weekends on [email or web address]

## 2024-03-04 NOTE — Progress Notes (Signed)
 NAME:  Timothy Nicholson, MRN:  [id number], DOB:  [birth year], LOS: 1 ADMISSION DATE:  03/03/2024, CONSULTATION DATE: 03/03/2024 REFERRING MD: Dr. Ezra, CHIEF COMPLAINT: Chest pain  History of Present Illness:  73 year old male with hypertension, hyperlipidemia, BPH, CKD 3a and prediabetes who presented as a code STEMI, after he developed chest pain while working at home, he was emergently taken to Cath Lab, underwent LHC.  During procedure he was hypotensive requiring Levophed  infusion, vomited and aspirated requiring nonrebreather facemask.  His LVEDP was 30 with wedge pressure of 24, CPAP was placed after that he went into brief VT/VF and was shocked.  Patient was noted to have occluded proximal LAD, underwent PCI and DES. Post procedure complicated by cardiogenic shock with Impella CP placement  Pertinent  Medical History   Past Medical History:  Diagnosis Date   Arthritis    osteo   Back pain    Benign prostatic hyperplasia with urinary obstruction and other lower urinary tract symptoms    Cancer (HCC)    maligant polyp   Dyslipidemia    Dysplastic nevus 08/13/2006   L chest pectoral - mod, excision 06.10.2008   Dysplastic nevus 09/29/2006   L groin - mild   Elevated PSA    Enlarged prostate    Family history of breast cancer    Family history of colon cancer    Family history of lung cancer    Family history of pancreatic cancer    Family history of prostate cancer    GERD (gastroesophageal reflux disease)    Headache    migraine without aura   HOH (hard of hearing)    Hypercholesterolemia    Hypertension    Personal history of colon cancer 01/19/2018   Scoliosis      Significant Hospital Events: Including procedures, antibiotic start and stop dates in addition to other pertinent events   STEMI with PCI to LAD, Impella CP placement  Interim History / Subjective:  Events as above, new admit overnight  Objective    Blood pressure (!) 141/85, pulse 87, temperature  99.7 F (37.6 C), resp. rate 15, height 5' 10 (1.778 m), weight 89 kg, SpO2 98%. PAP: (13-44)/(5-24) 29/14 CVP:  [0 mmHg-64 mmHg] 4 mmHg PCWP:  [12 mmHg] 12 mmHg CO:  [3.6 L/min-6.2 L/min] 3.8 L/min CI:  [1.81 L/min/m2-3.12 L/min/m2] 1.92 L/min/m2  FiO2 (%):  [50 %-100 %] 50 %   Intake/Output Summary (Last 24 hours) at 03/04/2024 0728 Last data filed at 03/04/2024 0700 Gross per 24 hour  Intake 6458.83 ml  Output 1545 ml  Net 4913.83 ml   Filed Weights   03/03/24 1400 03/04/24 0500  Weight: 79.4 kg 89 kg    Examination: General: Acutely ill-appearing elderly male, lying on the bed HEENT: [place]/AT, eyes anicteric.  moist mucus membranes.  Currently on heated high flow nasal cannula oxygen Neuro: Alert, awake following commands, no focal deficits Chest: Anterior lung fields clear Heart: Regular rate and rhythm, no murmurs or gallops Abdomen: Soft, nontender, nondistended, bowel sounds present Right fem Impella, no bleeding Ext: Cool, doppler DP's   Patient Lines/Drains/Airways Status     Active Line/Drains/Airways     Name Placement date Placement time Site Days   Sheath 03/03/24 Right Internal Jugular 03/03/24  1644  Internal Jugular  less than 1   Impella 03/03/24  1522  -- less than 1            Resolved problem list   Assessment and Plan  Acute anterior  STEMI status post PCI and DES to LAD 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 Bicarb purge. P7 with 3.2 L flow - DAPT per cards: Cangrelor  infusion, ASA - nausea has prevented transition - Nausea: Patient frequently nauseous after receiving sedating meds/anesthesia - improving this morning. Phebus also be related to low flow state and increasing dobutamine  this morning should help - Diuresis, goal even to neg 1 L  - Dobutamine   - Levophed /Vasopressin  titration for MAP > 65 - Amiodarone  infusion - PA Cath: CVP 5, CI 1.92,  lactate rising > dobutamine  increased this morning- will need repeat numbers after increase  Acute respiratory failure with hypoxia Aspiration pneumonia/pneumonitis - High flow [place] with improving oxygen requirements overnight - Continue Unasyn  for aspiration PNA coverage  Acute kidney injury with known CKD stage IIIa - Baseline creatinine 1.4-1.6 - Creatinine peak at 2.15 - Likely multifactorial with shock state, acute hfref - Continue to follow UOP closely  Elevated LFTs - Normal on admit, mildly elevated this am and likely due to shock state - continue to trend   Hyperglycemia - A1c 5.6 - BG 284 on am labs, start SSI q 4 hours > can transition to AC/HS once improved control.   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  - Incidental finding, outpatient follow up  DVT Ppx: None > on cangrelor  infusion GI: PPI  Diet: Start with clears and advance as tolerated. Labs   CBC: Recent Labs  Lab 03/03/24 1505 03/03/24 1510 03/03/24 1649 03/03/24 1940 03/03/24 2112 03/04/24 0045 03/04/24 0500  WBC 11.3*  --   --  16.4* 15.9*  --  10.7*  NEUTROABS 8.9*  --   --   --  13.9*  --   --   HGB 12.6*   < > 13.6 11.9* 9.5* 10.6* 10.3*  HCT 38.2*   < > 40.0 35.0* 29.0* 30.5* 29.3*  MCV 96.5  --   --  95.9 99.3  --  91.0  PLT 185  --   --  229 192  --  136*   < > = values in this interval not displayed.    Basic Metabolic Panel: Recent Labs  Lab 03/03/24 1505 03/03/24 1510 03/03/24 1542 03/03/24 1648 03/03/24 1649 03/03/24 2155 03/04/24 0500  NA 137 140  140 128* 135 135 136 135  K 3.6 3.7  3.6 3.5 4.2 4.2 3.7 3.6  CL 102 104  --   --   --  103 102  CO2 19*  --   --   --   --  15* 18*  GLUCOSE 145* 151*  --   --   --  363* 284*  BUN 30* 31*  --   --   --  31* 30*  CREATININE 1.49* 1.60*  --   --   --  2.09* 2.15*  CALCIUM  8.5*  --   --   --   --  7.5* 7.4*  MG  --   --   --   --   --  1.5* 2.1   GFR: Estimated  Creatinine Clearance: 34.4 mL/min (A) (by C-G formula based on SCr of 2.15 mg/dL (H)). Recent Labs  Lab 03/03/24 1505 03/03/24 1510 03/03/24 1543 03/03/24 1859 03/03/24 1940 03/03/24 2112 03/04/24 0458 03/04/24 0500  WBC 11.3*  --   --   --  16.4* 15.9*  --  10.7*  LATICACIDVEN  --  1.1 1.7 1.6  --   --  2.6*  --     Liver Function Tests: Recent Labs  Lab 03/03/24 1505 03/04/24 0500  AST 21 360*  ALT 19 68*  ALKPHOS 34* 25*  BILITOT 0.8 0.9  PROT 6.6 5.2*  ALBUMIN  3.7 3.2*   No results for input(s): LIPASE, AMYLASE in the last 168 hours. No results for input(s): AMMONIA in the last 168 hours.  ABG    Component Value Date/Time   PHART 7.446 03/03/2024 1648   PCO2ART 32.0 03/03/2024 1648   PO2ART 51 (L) 03/03/2024 1648   HCO3 24.9 03/03/2024 1649   TCO2 26 03/03/2024 1649   ACIDBASEDEF 1.0 03/03/2024 1648   O2SAT 57 03/04/2024 0500     Coagulation Profile: Recent Labs  Lab 03/03/24 1505  INR 1.0    Cardiac Enzymes: No results for input(s): CKTOTAL, CKMB, CKMBINDEX, TROPONINI in the last 168 hours.  HbA1C: Hgb A1c MFr Bld  Date/Time Value Ref Range Status  03/03/2024 03:05 PM 5.6 4.8 - 5.6 % Final    Comment:    (NOTE) Diagnosis of Diabetes The following HbA1c ranges recommended by the American Diabetes Association (ADA) [place] be used as an aid in the diagnosis of diabetes mellitus.  Hemoglobin             Suggested A1C NGSP%              Diagnosis  <5.7                   Non Diabetic  5.7-6.4                Pre-Diabetic  >6.4                   Diabetic  <7.0                   Glycemic control for                       adults with diabetes.      CBG: Recent Labs  Lab 03/03/24 1856  GLUCAP 267*      Critical care time:     The patient is critically ill due to acute STEMI/acute HFrEF with cardiogenic shock/acute respiratory failure with hypoxia.  Critical care was necessary to treat or prevent imminent or life-threatening  deterioration.  Critical care was time spent personally by me on the following activities: development of treatment plan with patient and/or surrogate as well as nursing, discussions with consultants, evaluation of patient's response to treatment, examination of patient, obtaining history from patient or surrogate, ordering and performing treatments and interventions, ordering and review of laboratory studies, ordering and review of radiographic studies, pulse oximetry, re-evaluation of patient's condition and participation in multidisciplinary rounds.   During this encounter critical care time was devoted to patient care services described in this note for 35 minutes.    Lucie Vinie [facility] See Amion for pager If no response to pager, please call [phone] until 7pm After 7pm, Please call E-link [phone]

## 2024-03-04 NOTE — Progress Notes (Signed)
 Pt transitioned from [facility] to 10L HFNC and is tolerating well at this time.

## 2024-03-04 NOTE — Progress Notes (Signed)
 Echocardiogram 2D Echocardiogram has been performed.  Timothy Nicholson 03/04/2024, 9:57 AM

## 2024-03-05 ENCOUNTER — Inpatient Hospital Stay (HOSPITAL_COMMUNITY)

## 2024-03-05 DIAGNOSIS — J9601 Acute respiratory failure with hypoxia: Secondary | ICD-10-CM | POA: Diagnosis not present

## 2024-03-05 DIAGNOSIS — D62 Acute posthemorrhagic anemia: Secondary | ICD-10-CM | POA: Diagnosis not present

## 2024-03-05 DIAGNOSIS — J690 Pneumonitis due to inhalation of food and vomit: Secondary | ICD-10-CM | POA: Diagnosis not present

## 2024-03-05 DIAGNOSIS — I2102 ST elevation (STEMI) myocardial infarction involving left anterior descending coronary artery: Secondary | ICD-10-CM | POA: Diagnosis not present

## 2024-03-05 DIAGNOSIS — R570 Cardiogenic shock: Secondary | ICD-10-CM | POA: Diagnosis not present

## 2024-03-05 LAB — COOXEMETRY PANEL
Carboxyhemoglobin: 2.1 % — ABNORMAL HIGH (ref 0.5–1.5)
Carboxyhemoglobin: 1 % (ref 0.5–1.5)
Methemoglobin: <0.7 % (ref 0.0–1.5)
Methemoglobin: <0.7 % (ref 0.0–1.5)
O2 Saturation: 65.2 %
O2 Saturation: 61.2 %
Total hemoglobin: 8.9 g/dL — ABNORMAL LOW (ref 12.0–16.0)
Total hemoglobin: 7.8 g/dL — ABNORMAL LOW (ref 12.0–16.0)

## 2024-03-05 LAB — COMPREHENSIVE METABOLIC PANEL WITH GFR
ALT: 58 U/L — ABNORMAL HIGH (ref 0–44)
AST: 174 U/L — ABNORMAL HIGH (ref 15–41)
Albumin: 3.1 g/dL — ABNORMAL LOW (ref 3.5–5.0)
Alkaline Phosphatase: 24 U/L — ABNORMAL LOW (ref 38–126)
Anion gap: 13 (ref 5–15)
BUN: 34 mg/dL — ABNORMAL HIGH (ref 8–23)
CO2: 23 mmol/L (ref 22–32)
Calcium: 7.3 mg/dL — ABNORMAL LOW (ref 8.9–10.3)
Chloride: 100 mmol/L (ref 98–111)
Creatinine, Ser: 2.37 mg/dL — ABNORMAL HIGH (ref 0.61–1.24)
GFR, Estimated: 28 mL/min — ABNORMAL LOW (ref >60)
Glucose, Bld: 156 mg/dL — ABNORMAL HIGH (ref 70–99)
Potassium: 3.7 mmol/L (ref 3.5–5.1)
Sodium: 136 mmol/L (ref 135–145)
Total Bilirubin: 0.7 mg/dL (ref 0.0–1.2)
Total Protein: 5.4 g/dL — ABNORMAL LOW (ref 6.5–8.1)

## 2024-03-05 LAB — POCT I-STAT 7, (LYTES, BLD GAS, ICA,H+H)
Acid-Base Excess: 1 mmol/L (ref 0.0–2.0)
Bicarbonate: 24.7 mmol/L (ref 20.0–28.0)
Calcium, Ion: 1.03 mmol/L — ABNORMAL LOW (ref 1.15–1.40)
HCT: 22 % — ABNORMAL LOW (ref 39.0–52.0)
Hemoglobin: 7.5 g/dL — ABNORMAL LOW (ref 13.0–17.0)
O2 Saturation: 96 %
Patient temperature: 37.4
Potassium: 3.8 mmol/L (ref 3.5–5.1)
Sodium: 134 mmol/L — ABNORMAL LOW (ref 135–145)
TCO2: 26 mmol/L (ref 22–32)
pCO2 arterial: 35.7 mmHg (ref 32–48)
pH, Arterial: 7.449 (ref 7.35–7.45)
pO2, Arterial: 79 mmHg — ABNORMAL LOW (ref 83–108)

## 2024-03-05 LAB — GLUCOSE, CAPILLARY
Glucose-Capillary: 163 mg/dL — ABNORMAL HIGH (ref 70–99)
Glucose-Capillary: 168 mg/dL — ABNORMAL HIGH (ref 70–99)
Glucose-Capillary: 155 mg/dL — ABNORMAL HIGH (ref 70–99)
Glucose-Capillary: 146 mg/dL — ABNORMAL HIGH (ref 70–99)
Glucose-Capillary: 161 mg/dL — ABNORMAL HIGH (ref 70–99)
Glucose-Capillary: 171 mg/dL — ABNORMAL HIGH (ref 70–99)

## 2024-03-05 LAB — CBC
HCT: 25.5 % — ABNORMAL LOW (ref 39.0–52.0)
HCT: 22.7 % — ABNORMAL LOW (ref 39.0–52.0)
Hemoglobin: 9 g/dL — ABNORMAL LOW (ref 13.0–17.0)
Hemoglobin: 7.8 g/dL — ABNORMAL LOW (ref 13.0–17.0)
MCH: 31.9 pg (ref 26.0–34.0)
MCH: 31.7 pg (ref 26.0–34.0)
MCHC: 35.3 g/dL (ref 30.0–36.0)
MCHC: 34.4 g/dL (ref 30.0–36.0)
MCV: 90.4 fL (ref 80.0–100.0)
MCV: 92.3 fL (ref 80.0–100.0)
Platelets: 98 K/uL — ABNORMAL LOW (ref 150–400)
Platelets: 78 K/uL — ABNORMAL LOW (ref 150–400)
RBC: 2.82 MIL/uL — ABNORMAL LOW (ref 4.22–5.81)
RBC: 2.46 MIL/uL — ABNORMAL LOW (ref 4.22–5.81)
RDW: 14.8 % (ref 11.5–15.5)
RDW: 14.6 % (ref 11.5–15.5)
WBC: 12.4 K/uL — ABNORMAL HIGH (ref 4.0–10.5)
WBC: 11 K/uL — ABNORMAL HIGH (ref 4.0–10.5)
nRBC: 0 % (ref 0.0–0.2)
nRBC: 0 % (ref 0.0–0.2)

## 2024-03-05 LAB — BASIC METABOLIC PANEL WITH GFR
Anion gap: 12 (ref 5–15)
BUN: 38 mg/dL — ABNORMAL HIGH (ref 8–23)
CO2: 22 mmol/L (ref 22–32)
Calcium: 7.2 mg/dL — ABNORMAL LOW (ref 8.9–10.3)
Chloride: 100 mmol/L (ref 98–111)
Creatinine, Ser: 2.24 mg/dL — ABNORMAL HIGH (ref 0.61–1.24)
GFR, Estimated: 30 mL/min — ABNORMAL LOW (ref >60)
Glucose, Bld: 149 mg/dL — ABNORMAL HIGH (ref 70–99)
Potassium: 3.7 mmol/L (ref 3.5–5.1)
Sodium: 134 mmol/L — ABNORMAL LOW (ref 135–145)

## 2024-03-05 LAB — CG4 I-STAT (LACTIC ACID): Lactic Acid, Venous: 1.9 mmol/L (ref 0.5–1.9)

## 2024-03-05 LAB — LACTATE DEHYDROGENASE: LDH: 774 U/L — ABNORMAL HIGH (ref 105–235)

## 2024-03-05 LAB — MAGNESIUM: Magnesium: 2.1 mg/dL (ref 1.7–2.4)

## 2024-03-05 LAB — LIPOPROTEIN A (LPA): Lipoprotein (a): 11.9 nmol/L (ref <75.0)

## 2024-03-05 MED ORDER — DEXMEDETOMIDINE HCL IN NACL 400 MCG/100ML IV SOLN
INTRAVENOUS | Status: AC
Start: 2024-03-05 — End: 2024-03-05
  Administered 2024-03-05: 0.4 ug/kg/h via INTRAVENOUS
  Filled 2024-03-05: qty 100

## 2024-03-05 MED ORDER — INSULIN GLARGINE-YFGN 100 UNIT/ML ~~LOC~~ SOLN
5.0000 [IU] | Freq: Every day | SUBCUTANEOUS | Status: DC
  Administered 2024-03-05 – 2024-03-07 (×3): 5 [IU] via SUBCUTANEOUS
  Filled 2024-03-05 (×4): qty 0.05

## 2024-03-05 MED ORDER — STERILE WATER FOR INJECTION IJ SOLN
INTRAMUSCULAR | Status: AC
  Administered 2024-03-05: 10 mL
  Filled 2024-03-05: qty 10

## 2024-03-05 MED ORDER — OLANZAPINE 10 MG IM SOLR
2.5000 mg | Freq: Once | INTRAMUSCULAR | Status: AC
  Administered 2024-03-05: 2.5 mg via INTRAVENOUS
  Filled 2024-03-05 (×2): qty 10

## 2024-03-05 MED ORDER — DEXMEDETOMIDINE HCL IN NACL 400 MCG/100ML IV SOLN
0.0000 ug/kg/h | INTRAVENOUS | Status: DC
  Administered 2024-03-05: 0.5 ug/kg/h via INTRAVENOUS
  Administered 2024-03-06: 0.3 ug/kg/h via INTRAVENOUS
  Filled 2024-03-05 (×3): qty 100

## 2024-03-05 MED ORDER — ENSURE PLUS HIGH PROTEIN PO LIQD
237.0000 mL | Freq: Two times a day (BID) | ORAL | Status: DC
  Administered 2024-03-05 – 2024-03-07 (×4): 237 mL via ORAL

## 2024-03-05 MED ORDER — METOCLOPRAMIDE HCL 5 MG/ML IJ SOLN: 10.0000 mg | Freq: Three times a day (TID) | INTRAMUSCULAR | Status: DC | PRN

## 2024-03-05 MED ORDER — LORAZEPAM 2 MG/ML IJ SOLN
1.0000 mg | Freq: Three times a day (TID) | INTRAMUSCULAR | Status: DC | PRN
  Administered 2024-03-05: 1 mg via INTRAVENOUS
  Filled 2024-03-05: qty 1

## 2024-03-05 MED ORDER — POTASSIUM CHLORIDE CRYS ER 20 MEQ PO TBCR
40.0000 meq | EXTENDED_RELEASE_TABLET | Freq: Once | ORAL | Status: AC
  Administered 2024-03-05: 40 meq via ORAL
  Filled 2024-03-05: qty 2

## 2024-03-05 NOTE — Progress Notes (Signed)
 Patient ID: Timothy Nicholson, male   DOB: [birth year], 73 y.o.   MRN: [id number]     Advanced Heart Failure Rounding Note  Cardiologist: None  Chief Complaint: CHF Subjective:    Agitated this am. + nausea. Developed hematuria   On DBA, VP and NE  PAP: (23-47)/(6-20) 37/12 CVP:  [1 mmHg-85 mmHg] 8 mmHg PCWP:  [16 mmHg] 16 mmHg CO:  [4.7 L/min-5.1 L/min] 5.1 L/min CI:  [2.41 L/min/m2-2.6 L/min/m2] 2.58 L/min/m2  Impella P-7 with 3.3 L flo     Objective:   Weight Range: 88.5 kg Body mass index is 27.99 kg/m.   Vital Signs:   Temp:  [98.4 F (36.9 C)-99.5 F (37.5 C)] 98.8 F (37.1 C) (11/22 0900) Pulse Rate:  [67-99] 81 (11/22 0900) Resp:  [12-34] 17 (11/22 0900) BP: (110-146)/(43-105) 127/71 (11/22 0900) SpO2:  [90 %-99 %] 92 % (11/22 0900) Arterial Line BP: (79-129)/(46-63) 104/50 (11/22 0900) Weight:  [88.5 kg] 88.5 kg (11/22 0500) Last BM Date : 03/04/24  Weight change: Filed Weights   03/03/24 1400 03/04/24 0500 03/05/24 0500  Weight: 79.4 kg 89 kg 88.5 kg    Intake/Output:   Intake/Output Summary (Last 24 hours) at 03/05/2024 1032 Last data filed at 03/05/2024 0955 Gross per 24 hour  Intake 2329.32 ml  Output 1035 ml  Net 1294.32 ml      Physical Exam    General:  Sitting up in bed. Agitated No resp difficulty HEENT: normal Neck: supple.+ RIJ swan  Cor: Regular rate & rhythm. No rubs, gallops or murmurs. Lungs: clear Abdomen: soft, nontender, nondistended.Good bowel sounds. Extremities: no cyanosis, clubbing, rash, edema Impella site ok  Neuro: awake alert communicative restless   Telemetry   NSR 80s (personally reviewed)  Labs    CBC Recent Labs    03/03/24 1505 03/03/24 1510 03/03/24 2112 03/04/24 0045 03/04/24 1630 03/05/24 0333  WBC 11.3*   < > 15.9*   < > 12.9* 12.4*  NEUTROABS 8.9*  --  13.9*  --   --   --   HGB 12.6*   < > 9.5*   < > 9.7* 9.0*  HCT 38.2*   < > 29.0*   < > 27.8* 25.5*  MCV 96.5   < > 99.3   < > 89.7 90.4   PLT 185   < > 192   < > 111* 98*   < > = values in this interval not displayed.   Basic Metabolic Panel Recent Labs    88/78/74 1630 03/05/24 0333  NA 136 136  K 3.4* 3.7  CL 102 100  CO2 22 23  GLUCOSE 154* 156*  BUN 31* 34*  CREATININE 2.21* 2.37*  CALCIUM  7.4* 7.3*  MG 2.1 2.1   Liver Function Tests Recent Labs    03/04/24 0500 03/05/24 0333  AST 360* 174*  ALT 68* 58*  ALKPHOS 25* 24*  BILITOT 0.9 0.7  PROT 5.2* 5.4*  ALBUMIN  3.2* 3.1*   No results for input(s): LIPASE, AMYLASE in the last 72 hours. Cardiac Enzymes No results for input(s): CKTOTAL, CKMB, CKMBINDEX, TROPONINI in the last 72 hours.  BNP: BNP (last 3 results) No results for input(s): BNP in the last 8760 hours.  ProBNP (last 3 results) No results for input(s): PROBNP in the last 8760 hours.   D-Dimer No results for input(s): DDIMER in the last 72 hours. Hemoglobin A1C Recent Labs    03/03/24 1505  HGBA1C 5.6   Fasting Lipid Panel Recent Labs  03/04/24 0500  CHOL 95  HDL 41  LDLCALC 45  TRIG 43  CHOLHDL 2.3   Thyroid Function Tests No results for input(s): TSH, T4TOTAL, T3FREE, THYROIDAB in the last 72 hours.  Invalid input(s): FREET3  Other results:   Imaging    No results found.    Medications:     Scheduled Medications:  sodium chloride    Intravenous Once   sodium chloride    Intravenous Once   aspirin   81 mg Oral Daily   Chlorhexidine  Gluconate Cloth  6 each Topical Daily   finasteride   5 mg Oral Daily   insulin  aspart  0-15 Units Subcutaneous Q4H   insulin  glargine-yfgn  5 Units Subcutaneous Daily   mouth rinse  15 mL Mouth Rinse 4 times per day   pantoprazole   40 mg Oral Daily   rosuvastatin   20 mg Oral Daily   scopolamine   1 patch Transdermal Q72H   sodium chloride  flush  3 mL Intravenous Q12H   tamsulosin   0.4 mg Oral Daily   ticagrelor   90 mg Oral BID    Infusions:  amiodarone  30 mg/hr (03/05/24 0800)    ampicillin -sulbactam (UNASYN ) IV 3 g (03/05/24 1028)   cangrelor  (KENGREAL ) 50 mg in sodium chloride  0.9 % 250 mL (0.2 mg/mL) infusion Stopped (03/04/24 1021)   DOBUTamine  4 mcg/kg/min (03/05/24 0800)   norepinephrine  (LEVOPHED ) Adult infusion 11 mcg/min (03/05/24 1005)   promethazine  (PHENERGAN ) injection (IM or IVPB) Stopped (03/03/24 2233)   sodium bicarbonate  25 mEq (Impella PURGE) in dextrose  5 % 1000 mL bag     vasopressin  0.03 Units/min (03/05/24 0851)    PRN Medications: acetaminophen , HYDROmorphone  (DILAUDID ) injection, iohexol , LORazepam , melatonin, metoCLOPramide  (REGLAN ) injection, ondansetron  (ZOFRAN ) IV, mouth rinse, oxyCODONE , promethazine  (PHENERGAN ) injection (IM or IVPB), sodium chloride  flush     Assessment/Plan   1. CAD: Anterior STEMI with occluded proximal LAD; the RCA was not selectively engaged but appeared to be relatively small with proximal occlusion. Now s/p DES to proximal LAD and PTCA to ostial D1.  - Off cangrelor .  - Now taking some pos so on ASA/brilinta   - No further CP  2. Cardiogenic shock: Due to anterior MI.  Limited echo with Impella showed EF 25% by my read with LAD territory akinesis, normal RV size/mildly decreased systolic function.  - On DBA 4, NE 11 and VP 0.03. Co-ox 65% Swan numbers done personally. Improved. Wean NE as tolerated. No lasix  today - Continue Impella at P7 . Flows ok  - He pulled Impella back this am. I did ech and repositioned at bedside (advanced 2 cm)  - No heparin  gtt, using HCO3 purge only for Impella.  3. AKI on CKD stage 3: Creatinine 1.6 => 2.1 -> 2.3 with cardiogenic/hemorrhagic shock. Also had contrast loads with cath and CTA.   - Follow closely, support CO.   4. VT: Peri-STEMI. - Continue amiodarone  gtt 30 mg/hr for now - No VT.  5. Heme: Hemorrhagic component to shock overnight with hematoma at Impella access site and RP hematoma.  CTA showed RP hematoma without active extravasation.  - No heparin  as above (HCO3  purge).  - Need to get him off cangrelor  as soon as possible.  - Transfuse hgb < 8. Follow CBC  6. Pulmonary: Possible aspiration PNA.  - Continue Unasyn .  7. Hematuria - likely local trauma. Follow 8. Delirium - d/w CCM   CRITICAL CARE Performed by: [name]  Total critical care time: 50 minutes  Critical care time was exclusive of separately  billable procedures and treating other patients.  Critical care was necessary to treat or prevent imminent or life-threatening deterioration.  Critical care was time spent personally by me on the following activities: development of treatment plan with patient and/or surrogate as well as nursing, discussions with consultants, evaluation of patient's response to treatment, examination of patient, obtaining history from patient or surrogate, ordering and performing treatments and interventions, ordering and review of laboratory studies, ordering and review of radiographic studies, pulse oximetry and re-evaluation of patient's condition.     Length of Stay: 2  [name], MD  03/05/2024, 10:32 AM  Advanced Heart Failure Team Pager [phone] (M-F; 7a - 5p)  Please contact [facility] for night-coverage after hours (5p -7a ) and weekends on [email or web address]

## 2024-03-05 NOTE — Progress Notes (Addendum)
 Agitated this afternoon, some tremulousness. Tried ativan , then worse. On my exam, possibly hallucinating, directable, slightly tremulous.After olanzapine , worse. Family at bedside trying to calm him down.   Plan: Precedex  STAT BMP, LA, ABG  Leita SHAUNNA Gaskins, DO 03/05/24 4:20 PM [facility]  For contact information, see Amion. If no response to pager, please call PCCM consult pager. After hours, 7PM- 7AM, please call Elink.     LA 1.93 ABG 7.45/36/79/25 on 6L O2 LAB CBC & BMP pending  Plan: Check coox, CXR  Leita SHAUNNA Gaskins, DO 03/05/24 5:00 PM [facility]  For contact information, see Amion. If no response to pager, please call PCCM consult pager. After hours, 7PM- 7AM, please call Elink.    Coox 61% CXR personally reviewed> minimal central opacities, improving> most likely pulmonary edema  UOP had dropped off. Foley irrigated with flush> several hundred cc of bloody urine drained after, which [place] have caused the agitation  Currently NE 18 CVP 4 PA 35/9 (19)  After bladder drained fully, would start decreasing precedex  to see if agitation has resolved. All labs so far have been reassuring.   Leita SHAUNNA Gaskins, DO 03/05/24 5:54 PM [facility]

## 2024-03-05 NOTE — Progress Notes (Signed)
 NAME:  Timothy Nicholson, MRN:  [id number], DOB:  02-05-1951, LOS: 2 ADMISSION DATE:  03/03/2024, CONSULTATION DATE: 03/03/2024 REFERRING MD: Dr. Ezra, CHIEF COMPLAINT: Chest pain  History of Present Illness:  73 year old male with hypertension, hyperlipidemia, BPH, CKD 3a and prediabetes who presented as a code STEMI, after he developed chest pain while working at home, he was emergently taken to Cath Lab, underwent LHC.  During procedure he was hypotensive requiring Levophed  infusion, vomited and aspirated requiring nonrebreather facemask.  His LVEDP was 30 with wedge pressure of 24, CPAP was placed after that he went into brief VT/VF and was shocked.  Patient was noted to have occluded proximal LAD, underwent PCI and DES. Post procedure complicated by cardiogenic shock with Impella CP placement  Pertinent  Medical History   Past Medical History:  Diagnosis Date   Arthritis    osteo   Back pain    Benign prostatic hyperplasia with urinary obstruction and other lower urinary tract symptoms    Cancer (HCC)    maligant polyp   Dyslipidemia    Dysplastic nevus 08/13/2006   L chest pectoral - mod, excision 06.10.2008   Dysplastic nevus 09/29/2006   L groin - mild   Elevated PSA    Enlarged prostate    Family history of breast cancer    Family history of colon cancer    Family history of lung cancer    Family history of pancreatic cancer    Family history of prostate cancer    GERD (gastroesophageal reflux disease)    Headache    migraine without aura   HOH (hard of hearing)    Hypercholesterolemia    Hypertension    Personal history of colon cancer 01/19/2018   Scoliosis      Significant Hospital Events: Including procedures, antibiotic start and stop dates in addition to other pertinent events   STEMI with PCI to LAD, Impella CP placement  Interim History / Subjective:  Vomited this morning. New blood in foley that began suddenly- no preceding trauma.  DBA , NE 11mcg,  vasopressin  0.03.  Objective    Blood pressure 127/71, pulse 81, temperature 98.8 F (37.1 C), resp. rate 17, height 5' 10 (1.778 m), weight 88.5 kg, SpO2 92%. PAP: (23-41)/(6-20) 34/8 CVP:  [1 mmHg-76 mmHg] 1 mmHg CO:  [4.4 L/min-5.1 L/min] 5.1 L/min CI:  [2.2 L/min/m2-2.59 L/min/m2] 2.59 L/min/m2      Intake/Output Summary (Last 24 hours) at 03/05/2024 0930 Last data filed at 03/05/2024 0900 Gross per 24 hour  Intake 2750.84 ml  Output 1255 ml  Net 1495.84 ml   Filed Weights   03/03/24 1400 03/04/24 0500 03/05/24 0500  Weight: 79.4 kg 89 kg 88.5 kg    Examination: General: ill appearing man lying in bed in NAD HEENT: [place]/AT, eyes anicteric Neuro: lethargic, arouses to verbal stimulation, answering questions Chest: rhales in R base, breathing comfortably on [place] Heart: S1S2, RRR.   Abdomen:  soft, NT GU: some ecchymoses on lateral R scrotum, soft around groin with impella on R.  Right fem Impella, no bleeding Ext: cool LE- similar to previous  Impella P7 with 3.2L flow  Coox 65%on  DBA, NE, Impella  BUN 34 Cr 2.37 AST 174 ALT 58  T bili 0.7 LDH 774 EKG personally reviewed> NSR, interventricular conduction delay, normal Qtc. No TWI.    Resolved problem list   Assessment and Plan  Acute anterior STEMI status post PCI with DES to LAD Acute HFrEF (35%) with cardiogenic  shock requiring CP Impella  VT/V-fib  -con't impella with bicarb purge -holding AC due to RP bleed -DAPT; need to aggressively control nausea when giving brilinta ; otherwise he would have to restart cangrelor  -statin -inotropes per AHF-- remains on DBA, NE plus vasopressin  -received lasix  yesterday; was net positive still for day shift but has had blood loss that cannot be quantified with RP bleed -amiodarone  -potassium repletion this morning  Acute respiratory failure with hypoxia Aspiration pneumonia vs pneumonitis -con't unasyn  for aspiration -supplemental O2 to maintain SpO2  >90% -pulmonary hygiene  Nausea and vomiting -con't scop patch -reglan , zofran , phenergan  PRN for nausea. Qtc ok. 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  - Incidental finding, outpatient follow up needed  Hematuria, sudden onset, not sure about the cause -con't DAPT -if getting progressively more bloody, Wavra need to involve urology for CBI -flush foley to maintain patency -con't PTA tamsulosin    DVT Ppx: holding with RP bleed GI: PPI Diet: clears as tolerated Labs   CBC: Recent Labs  Lab 03/03/24 1505 03/03/24 1510 03/03/24 2112 03/04/24 0045 03/04/24 0500 03/04/24 1239 03/04/24 1630 03/05/24 0333  WBC 11.3*   < > 15.9*  --  10.7* 11.4* 12.9* 12.4*  NEUTROABS 8.9*  --  13.9*  --   --   --   --   --   HGB 12.6*   < > 9.5* 10.6* 10.3* 10.2* 9.7* 9.0*  HCT 38.2*   < > 29.0* 30.5* 29.3* 28.9* 27.8* 25.5*  MCV 96.5   < > 99.3  --  91.0 90.3 89.7 90.4  PLT 185   < > 192  --  136* 124* 111* 98*   < > = values in this interval not displayed.    Basic Metabolic Panel: Recent Labs  Lab 03/03/24 1505 03/03/24 1510 03/03/24 1542 03/03/24 1649 03/03/24 2155 03/04/24 0500 03/04/24 1630 03/05/24 0333  NA 137 140  140   < > 135 136 135 136 136  K 3.6 3.7  3.6   < > 4.2 3.7 3.6 3.4* 3.7  CL 102 104  --   --  103 102 102 100  CO2 19*  --   --   --  15* 18* 22 23  GLUCOSE 145* 151*  --   --  363* 284* 154* 156*  BUN 30* 31*   --   --  31* 30* 31* 34*  CREATININE 1.49* 1.60*  --   --  2.09* 2.15* 2.21* 2.37*  CALCIUM  8.5*  --   --   --  7.5* 7.4* 7.4* 7.3*  MG  --   --   --   --  1.5* 2.1 2.1 2.1   < > = values in this interval not displayed.   GFR: Estimated Creatinine Clearance: 31.1 mL/min (A) (by C-G formula based on SCr of 2.37 mg/dL (H)). Recent Labs  Lab 03/03/24 1543 03/03/24 1859 03/03/24 1940 03/04/24 0458 03/04/24 0500 03/04/24 1239 03/04/24 1403 03/04/24 1630 03/05/24 0333  WBC  --   --    < >  --  10.7* 11.4*  --  12.9* 12.4*  LATICACIDVEN 1.7 1.6  --  2.6*  --   --  1.6  --   --    < > = values in this interval not displayed.    Liver Function Tests: Recent Labs  Lab 03/03/24 1505 03/04/24 0500 03/05/24 0333  AST 21 360* 174*  ALT 19 68* 58*  ALKPHOS 34* 25* 24*  BILITOT 0.8 0.9 0.7  PROT 6.6 5.2* 5.4*  ALBUMIN  3.7 3.2* 3.1*       Critical care time:      This patient is critically ill with multiple organ system failure which requires frequent high complexity decision making, assessment, support, evaluation, and titration of therapies. This was completed through the application of advanced monitoring technologies and extensive interpretation of multiple databases. During this encounter critical care time was devoted to patient care services described in this note for 41 minutes.  Leita SHAUNNA Gaskins, DO 03/05/24 9:48 AM [facility]  For contact information, see Amion. If no response to pager, please call PCCM consult pager. After hours, 7PM- 7AM, please call Elink.

## 2024-03-06 ENCOUNTER — Inpatient Hospital Stay (HOSPITAL_COMMUNITY)

## 2024-03-06 DIAGNOSIS — I2102 ST elevation (STEMI) myocardial infarction involving left anterior descending coronary artery: Secondary | ICD-10-CM | POA: Diagnosis not present

## 2024-03-06 DIAGNOSIS — J690 Pneumonitis due to inhalation of food and vomit: Secondary | ICD-10-CM | POA: Diagnosis not present

## 2024-03-06 DIAGNOSIS — D62 Acute posthemorrhagic anemia: Secondary | ICD-10-CM | POA: Diagnosis not present

## 2024-03-06 DIAGNOSIS — J9601 Acute respiratory failure with hypoxia: Secondary | ICD-10-CM | POA: Diagnosis not present

## 2024-03-06 DIAGNOSIS — R570 Cardiogenic shock: Secondary | ICD-10-CM | POA: Diagnosis not present

## 2024-03-06 LAB — CBC
HCT: 22.6 % — ABNORMAL LOW (ref 39.0–52.0)
HCT: 23.2 % — ABNORMAL LOW (ref 39.0–52.0)
Hemoglobin: 7.7 g/dL — ABNORMAL LOW (ref 13.0–17.0)
Hemoglobin: 8 g/dL — ABNORMAL LOW (ref 13.0–17.0)
MCH: 31.8 pg (ref 26.0–34.0)
MCH: 31.5 pg (ref 26.0–34.0)
MCHC: 34.1 g/dL (ref 30.0–36.0)
MCHC: 34.5 g/dL (ref 30.0–36.0)
MCV: 93.4 fL (ref 80.0–100.0)
MCV: 91.3 fL (ref 80.0–100.0)
Platelets: 81 K/uL — ABNORMAL LOW (ref 150–400)
Platelets: 70 K/uL — ABNORMAL LOW (ref 150–400)
RBC: 2.42 MIL/uL — ABNORMAL LOW (ref 4.22–5.81)
RBC: 2.54 MIL/uL — ABNORMAL LOW (ref 4.22–5.81)
RDW: 14.3 % (ref 11.5–15.5)
RDW: 14.3 % (ref 11.5–15.5)
WBC: 9.5 K/uL (ref 4.0–10.5)
WBC: 8.9 K/uL (ref 4.0–10.5)
nRBC: 0 % (ref 0.0–0.2)
nRBC: 0 % (ref 0.0–0.2)

## 2024-03-06 LAB — COMPREHENSIVE METABOLIC PANEL WITH GFR
ALT: 43 U/L (ref 0–44)
AST: 112 U/L — ABNORMAL HIGH (ref 15–41)
Albumin: 3 g/dL — ABNORMAL LOW (ref 3.5–5.0)
Alkaline Phosphatase: 26 U/L — ABNORMAL LOW (ref 38–126)
Anion gap: 10 (ref 5–15)
BUN: 40 mg/dL — ABNORMAL HIGH (ref 8–23)
CO2: 23 mmol/L (ref 22–32)
Calcium: 7.2 mg/dL — ABNORMAL LOW (ref 8.9–10.3)
Chloride: 100 mmol/L (ref 98–111)
Creatinine, Ser: 2 mg/dL — ABNORMAL HIGH (ref 0.61–1.24)
GFR, Estimated: 35 mL/min — ABNORMAL LOW (ref >60)
Glucose, Bld: 117 mg/dL — ABNORMAL HIGH (ref 70–99)
Potassium: 3.4 mmol/L — ABNORMAL LOW (ref 3.5–5.1)
Sodium: 133 mmol/L — ABNORMAL LOW (ref 135–145)
Total Bilirubin: 1.1 mg/dL (ref 0.0–1.2)
Total Protein: 5.4 g/dL — ABNORMAL LOW (ref 6.5–8.1)

## 2024-03-06 LAB — GLUCOSE, CAPILLARY
Glucose-Capillary: 120 mg/dL — ABNORMAL HIGH (ref 70–99)
Glucose-Capillary: 137 mg/dL — ABNORMAL HIGH (ref 70–99)
Glucose-Capillary: 138 mg/dL — ABNORMAL HIGH (ref 70–99)
Glucose-Capillary: 147 mg/dL — ABNORMAL HIGH (ref 70–99)
Glucose-Capillary: 153 mg/dL — ABNORMAL HIGH (ref 70–99)
Glucose-Capillary: 158 mg/dL — ABNORMAL HIGH (ref 70–99)
Glucose-Capillary: 161 mg/dL — ABNORMAL HIGH (ref 70–99)

## 2024-03-06 LAB — LACTATE DEHYDROGENASE
LDH: 768 U/L — ABNORMAL HIGH (ref 105–235)
LDH: 680 U/L — ABNORMAL HIGH (ref 105–235)

## 2024-03-06 LAB — COOXEMETRY PANEL
Carboxyhemoglobin: 0.9 % (ref 0.5–1.5)
Methemoglobin: <0.7 % (ref 0.0–1.5)
O2 Saturation: 56.6 %
Total hemoglobin: 8.1 g/dL — ABNORMAL LOW (ref 12.0–16.0)

## 2024-03-06 LAB — MAGNESIUM: Magnesium: 2.1 mg/dL (ref 1.7–2.4)

## 2024-03-06 LAB — PREPARE RBC (CROSSMATCH)

## 2024-03-06 MED ORDER — SODIUM CHLORIDE 0.9% IV SOLUTION: Freq: Once | INTRAVENOUS | Status: AC

## 2024-03-06 MED ORDER — POTASSIUM CHLORIDE CRYS ER 20 MEQ PO TBCR
40.0000 meq | EXTENDED_RELEASE_TABLET | Freq: Once | ORAL | Status: AC
  Administered 2024-03-06: 40 meq via ORAL
  Filled 2024-03-06: qty 2

## 2024-03-06 MED ORDER — POLYETHYLENE GLYCOL 3350 17 G PO PACK
17.0000 g | PACK | Freq: Every day | ORAL | Status: DC
  Administered 2024-03-06 – 2024-03-07 (×2): 17 g via ORAL
  Filled 2024-03-06 (×3): qty 1

## 2024-03-06 NOTE — Progress Notes (Signed)
 eLink Physician-Brief Progress Note Patient Name: Tracey Stewart Russomanno DOB: January 19, 1951 MRN: [id number]   Date of Service  03/06/2024  HPI/Events of Note  K3.4  eICU Interventions  Kcl     Intervention Category Minor Interventions: Electrolytes abnormality - evaluation and management  Sheccid Lahmann 03/06/2024, 5:08 AM

## 2024-03-06 NOTE — Progress Notes (Signed)
 NAME:  Timothy Nicholson, MRN:  [id number], DOB:  [birth year], LOS: 3 ADMISSION DATE:  03/03/2024, CONSULTATION DATE: 03/03/2024 REFERRING MD: Dr. Ezra, CHIEF COMPLAINT: Chest pain  History of Present Illness:  73 year old male with hypertension, hyperlipidemia, BPH, CKD 3a and prediabetes who presented as a code STEMI, after he developed chest pain while working at home, he was emergently taken to Cath Lab, underwent LHC.  During procedure he was hypotensive requiring Levophed  infusion, vomited and aspirated requiring nonrebreather facemask.  His LVEDP was 30 with wedge pressure of 24, CPAP was placed after that he went into brief VT/VF and was shocked.  Patient was noted to have occluded proximal LAD, underwent PCI and DES. Post procedure complicated by cardiogenic shock with Impella CP placement  Pertinent  Medical History   Past Medical History:  Diagnosis Date   Arthritis    osteo   Back pain    Benign prostatic hyperplasia with urinary obstruction and other lower urinary tract symptoms    Cancer (HCC)    maligant polyp   Dyslipidemia    Dysplastic nevus 08/13/2006   L chest pectoral - mod, excision 06.10.2008   Dysplastic nevus 09/29/2006   L groin - mild   Elevated PSA    Enlarged prostate    Family history of breast cancer    Family history of colon cancer    Family history of lung cancer    Family history of pancreatic cancer    Family history of prostate cancer    GERD (gastroesophageal reflux disease)    Headache    migraine without aura   HOH (hard of hearing)    Hypercholesterolemia    Hypertension    Personal history of colon cancer 01/19/2018   Scoliosis      Significant Hospital Events: Including procedures, antibiotic start and stop dates in addition to other pertinent events   11/20  STEMI with PCI to LAD, Impella CP placement 11/22 hematuria started, agitation later that required precedex   Interim History / Subjective:  Slept with precedex  infusion  overnight. Feeling improved today, not agitated. Still achy all over due to immobility.  NE 13mcg, DBA 4mcg vasopressin  0.03, amiodaone 30mg , precedex  0.1mcg.  Objective    Blood pressure 127/88, pulse 77, temperature 99.5 F (37.5 C), resp. rate (!) 21, height 5' 10 (1.778 m), weight 90.4 kg, SpO2 92%. PAP: (29-48)/(6-21) 37/13 CVP:  [0 mmHg-85 mmHg] 7 mmHg PCWP:  [16 mmHg] 16 mmHg CO:  [4.1 L/min-5.1 L/min] 4.9 L/min CI:  [2.1 L/min/m2-2.6 L/min/m2] 2.5 L/min/m2  FiO2 (%):  [0 %] 0 %   Intake/Output Summary (Last 24 hours) at 03/06/2024 9061 Last data filed at 03/06/2024 9085 Gross per 24 hour  Intake 2500.85 ml  Output 1290 ml  Net 1210.85 ml   Filed Weights   03/04/24 0500 03/05/24 0500 03/06/24 0500  Weight: 89 kg 88.5 kg 90.4 kg    Examination: General: Elderly man lying in bed in NAD HEENT: Timothy Nicholson/AT, eyes anicteric Neuro: Lethargic, but arouses to verbal stimulation, moving all extremities Chest: Rhales in R base, wet-sounding cough. No accessory muscle use.  Heart: S1S2, RRR Abdomen:  soft, NT GU: bloody UOP  Ext: RLE impella, feet both cool.L radial Aline.  Impella P7 with 3.3L flow  Coox 57%on  DBA, NE, Impella P7  I/O +1.5L, net +7.7L  Na+  133 K+3.4 BUN 40 Cr 2.00 LDH 768 WBC 9.5 H/H 7.7/22.6 Platelets 81   Resolved problem list   Assessment and Plan  Acute  anterior STEMI status post PCI with DES to LAD Acute HFrEF (35%) with cardiogenic shock requiring CP Impella  VT/V-fib  -con't impella with bicarb purge; so far not tolerating weaning of cardiac support -inotropes per AHF -holding heparin  due to RP bleed, hematuria -DAPT -statin -not a candidate for GDMT with shock and AKI -tele monitoring -amiodarone  -monitor electrolytes and replete as needed  Acute respiratory failure with hypoxia Aspiration pneumonia  RLL -unasyn  , pulmonary hygiene- IS & flutter -wean supplemental O2 to maintain SpO2 >90%  Nausea and vomiting- none  today -Trial of removing scop patch -Con't PRN zofran , phenergan  for nausea.  -PPI  Acute kidney injury with baseline CKD stage IIIa; unfortunately has required contrast multiple times Hematuria- unknown etiology -maintain adequate perfusion -flush foley; if getting significant clots will need CBI -con't DAPT, holding heparin  -strict I/O  Elevated LFTs due to shock liver; improving -trend   Hyperglycemia with prediabetes, A1c 5.6 -con't semglee  5 units daily -con't SSI PRN -goal BG 140-180  ABLA due to hemorrhage Right RP hematoma -DAPT, but holding heparin  -transfuse for Hb <8 or hemodynamically significant bleeding -1 unit today  Thrombocytopenia likely due to impella; stable -monitor  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> question if this is related to hematuria for him - Incidental finding, outpatient follow up needed   DVT Ppx: holding with RP bleed GI: PPI Diet: regular cardiac diet as tolerated  Labs   CBC: Recent Labs  Lab 03/03/24 1505 03/03/24 1510 03/03/24 2112 03/04/24 0045 03/04/24 1239 03/04/24 1630 03/05/24 0333 03/05/24 1617 03/05/24 1628 03/06/24 0325  WBC 11.3*   < > 15.9*   < > 11.4* 12.9* 12.4* 11.0*  --  9.5  NEUTROABS 8.9*  --  13.9*  --   --   --   --   --   --   --   HGB 12.6*   < > 9.5*   < > 10.2* 9.7* 9.0* 7.8* 7.5* 7.7*  HCT 38.2*   < > 29.0*   < > 28.9* 27.8* 25.5* 22.7* 22.0* 22.6*  MCV 96.5   < > 99.3   < > 90.3 89.7 90.4 92.3  --  93.4  PLT 185   < > 192   < > 124* 111* 98* 78*  --  81*   < > = values in this interval not displayed.    Basic Metabolic Panel: Recent Labs  Lab 03/03/24 2155 03/04/24 0500 03/04/24 1630 03/05/24 0333 03/05/24 1617 03/05/24 1628 03/06/24 0325  NA 136 135 136 136 134* 134* 133*  K 3.7 3.6 3.4* 3.7 3.7 3.8 3.4*  CL 103 102 102 100 100  --  100  CO2 15* 18* 22 23 22   --  23  GLUCOSE 363* 284* 154* 156* 149*  --  117*  BUN 31* 30* 31* 34* 38*  --  40*  CREATININE 2.09* 2.15*  2.21* 2.37* 2.24*  --  2.00*  CALCIUM  7.5* 7.4* 7.4* 7.3* 7.2*  --  7.2*  MG 1.5* 2.1 2.1 2.1  --   --  2.1   GFR: Estimated Creatinine Clearance: 37.2 mL/min (A) (by C-G formula based on SCr of 2 mg/dL (H)). Recent Labs  Lab 03/03/24 1859 03/03/24 1940 03/04/24 0458 03/04/24 0500 03/04/24 1403 03/04/24 1630 03/05/24 0333 03/05/24 1617 03/05/24 1633 03/06/24 0325  WBC  --    < >  --    < >  --  12.9* 12.4* 11.0*  --  9.5  LATICACIDVEN 1.6  --  2.6*  --  1.6  --   --   --  1.9  --    < > = values in this interval not displayed.    Liver Function Tests: Recent Labs  Lab 03/03/24 1505 03/04/24 0500 03/05/24 0333 03/06/24 0325  AST 21 360* 174* 112*  ALT 19 68* 58* 43  ALKPHOS 34* 25* 24* 26*  BILITOT 0.8 0.9 0.7 1.1  PROT 6.6 5.2* 5.4* 5.4*  ALBUMIN  3.7 3.2* 3.1* 3.0*       Critical care time:      This patient is critically ill with multiple organ system failure which requires frequent high complexity decision making, assessment, support, evaluation, and titration of therapies. This was completed through the application of advanced monitoring technologies and extensive interpretation of multiple databases. During this encounter critical care time was devoted to patient care services described in this note for 40 minutes.  Leita SHAUNNA Gaskins, DO 03/06/24 10:45 AM [facility]  For contact information, see Amion. If no response to pager, please call PCCM consult pager. After hours, 7PM- 7AM, please call Elink.

## 2024-03-06 NOTE — Progress Notes (Signed)
 Patient ID: Timothy Nicholson, male   DOB: [birth year], 73 y.o.   MRN: [id number]     Advanced Heart Failure Rounding Note  Cardiologist: None  Chief Complaint: CHF Subjective:     Now on Precedex . Calm and oriented. No CP. Still with hematuria   On DBA 4, VP 0.03 and NE 8 -> 13  Swan numbers done personally  CVP 8 PA 37/13 PCWP 16 CO/CI 5.9/3.0 SVR 970   Scr improving 2.2 -> 2.0  Impella P-7 with 3.3L flow No alarms   Objective:   Weight Range: 90.4 kg Body mass index is 28.6 kg/m.   Vital Signs:   Temp:  [99 F (37.2 C)-99.7 F (37.6 C)] 99.7 F (37.6 C) (11/23 1400) Pulse Rate:  [68-111] 88 (11/23 1400) Resp:  [10-29] 27 (11/23 1400) BP: (98-144)/(58-100) 129/81 (11/23 1330) SpO2:  [84 %-99 %] 95 % (11/23 1400) Arterial Line BP: (72-117)/(44-64) 93/52 (11/23 1400) FiO2 (%):  [0 %] 0 % (11/22 1443) Weight:  [90.4 kg] 90.4 kg (11/23 0500) Last BM Date : 03/04/24  Weight change: Filed Weights   03/04/24 0500 03/05/24 0500 03/06/24 0500  Weight: 89 kg 88.5 kg 90.4 kg    Intake/Output:   Intake/Output Summary (Last 24 hours) at 03/06/2024 1410 Last data filed at 03/06/2024 1400 Gross per 24 hour  Intake 2549.39 ml  Output 1240 ml  Net 1309.39 ml      Physical Exam    General: Lying flat in bed  No resp difficulty HEENT: normal Neck: supple. RIJ swan  Cor: Regular rate & rhythm. No rubs, gallops or murmurs. Lungs: clear Abdomen: soft, nontender, nondistended.Good bowel sounds. Extremities: no cyanosis, clubbing, rash, edema RFA impell site ok  Neuro: alert & orientedx3, cranial nerves grossly intact. moves all 4 extremities w/o difficulty. Affect pleasant   Telemetry   NSR 80-90s Personally reviewed  Labs    CBC Recent Labs    03/03/24 1505 03/03/24 1510 03/03/24 2112 03/04/24 0045 03/05/24 1617 03/05/24 1628 03/06/24 0325  WBC 11.3*   < > 15.9*   < > 11.0*  --  9.5  NEUTROABS 8.9*  --  13.9*  --   --   --   --   HGB 12.6*   < > 9.5*    < > 7.8* 7.5* 7.7*  HCT 38.2*   < > 29.0*   < > 22.7* 22.0* 22.6*  MCV 96.5   < > 99.3   < > 92.3  --  93.4  PLT 185   < > 192   < > 78*  --  81*   < > = values in this interval not displayed.   Basic Metabolic Panel Recent Labs    88/77/74 0333 03/05/24 1617 03/05/24 1628 03/06/24 0325  NA 136 134* 134* 133*  K 3.7 3.7 3.8 3.4*  CL 100 100  --  100  CO2 23 22  --  23  GLUCOSE 156* 149*  --  117*  BUN 34* 38*  --  40*  CREATININE 2.37* 2.24*  --  2.00*  CALCIUM  7.3* 7.2*  --  7.2*  MG 2.1  --   --  2.1   Liver Function Tests Recent Labs    03/05/24 0333 03/06/24 0325  AST 174* 112*  ALT 58* 43  ALKPHOS 24* 26*  BILITOT 0.7 1.1  PROT 5.4* 5.4*  ALBUMIN  3.1* 3.0*   No results for input(s): LIPASE, AMYLASE in the last 72 hours. Cardiac Enzymes No results  for input(s): CKTOTAL, CKMB, CKMBINDEX, TROPONINI in the last 72 hours.  BNP: BNP (last 3 results) No results for input(s): BNP in the last 8760 hours.  ProBNP (last 3 results) No results for input(s): PROBNP in the last 8760 hours.   D-Dimer No results for input(s): DDIMER in the last 72 hours. Hemoglobin A1C Recent Labs    03/03/24 1505  HGBA1C 5.6   Fasting Lipid Panel Recent Labs    03/04/24 0500  CHOL 95  HDL 41  LDLCALC 45  TRIG 43  CHOLHDL 2.3   Thyroid Function Tests No results for input(s): TSH, T4TOTAL, T3FREE, THYROIDAB in the last 72 hours.  Invalid input(s): FREET3  Other results:   Imaging    DG CHEST PORT 1 VIEW Result Date: 03/05/2024 CLINICAL DATA:  200808 Hypoxia [id number] EXAM: PORTABLE CHEST 1 VIEW COMPARISON:  March 03, 2024 FINDINGS: The cardiomediastinal silhouette is unchanged and enlarged in contour. Similar pulmonary artery enlargement. RIGHT IJ PA catheter tip terminating over the RIGHT pulmonary artery. Lower extremity approach Impella device in similar position projecting over the LEFT ventricle and outflow tract to the ascending  thoracic aorta. No pleural effusion. No pneumothorax. Similar patchy perihilar predominant airspace opacities. IMPRESSION: Similar patchy perihilar predominant airspace opacities. Electronically Signed   By: Corean Salter M.D.   On: 03/05/2024 17:29      Medications:     Scheduled Medications:  sodium chloride    Intravenous Once   sodium chloride    Intravenous Once   aspirin   81 mg Oral Daily   Chlorhexidine  Gluconate Cloth  6 each Topical Daily   feeding supplement  237 mL Oral BID BM   finasteride   5 mg Oral Daily   insulin  aspart  0-15 Units Subcutaneous Q4H   insulin  glargine-yfgn  5 Units Subcutaneous Daily   mouth rinse  15 mL Mouth Rinse 4 times per day   pantoprazole   40 mg Oral Daily   polyethylene glycol  17 g Oral Daily   rosuvastatin   20 mg Oral Daily   sodium chloride  flush  3 mL Intravenous Q12H   tamsulosin   0.4 mg Oral Daily   ticagrelor   90 mg Oral BID    Infusions:  amiodarone  30 mg/hr (03/06/24 1135)   ampicillin -sulbactam (UNASYN ) IV Stopped (03/06/24 0943)   dexmedetomidine  (PRECEDEX ) IV infusion 0.1 mcg/kg/hr (03/06/24 1135)   DOBUTamine  4 mcg/kg/min (03/06/24 1135)   norepinephrine  (LEVOPHED ) Adult infusion 13 mcg/min (03/06/24 1135)   promethazine  (PHENERGAN ) injection (IM or IVPB) Stopped (03/03/24 2233)   sodium bicarbonate  25 mEq (Impella PURGE) in dextrose  5 % 1000 mL bag     vasopressin  0.03 Units/min (03/06/24 1135)    PRN Medications: acetaminophen , HYDROmorphone  (DILAUDID ) injection, iohexol , melatonin, ondansetron  (ZOFRAN ) IV, mouth rinse, oxyCODONE , promethazine  (PHENERGAN ) injection (IM or IVPB), sodium chloride  flush     Assessment/Plan   1. CAD: Anterior STEMI with occluded proximal LAD; the RCA was not selectively engaged but appeared to be relatively small with proximal occlusion. Now s/p DES to proximal LAD and PTCA to ostial D1.  - stable no s/s angina - continue ASA/statin/Brilinta  - management of post MI shock as  below 2. Cardiogenic shock: Due to anterior MI.  Limited echo with Impella showed EF 25% by my read with LAD territory akinesis, normal RV size/mildly decreased systolic function.  - On DBA 4, NE 13 and VP 0.03. Co-ox 57% Swan numbers done personally. Improved. Wean NE as tolerated. No lasix  today - Continue Impella at P7 .Flows ok - No heparin   gtt, using HCO3 purge only for Impella.  - We have been unable to wean drips or Impella. Have discussed with Dr. Lucas plan for possible Impella 5.5 tomorrow 3. AKI on CKD stage 3: Creatinine 1.6 => 2.1 -> 2.3 -> 2.0 with cardiogenic/hemorrhagic shock. Also had contrast loads with cath and CTA.   - Follow closely, support CO.   4. VT: Peri-STEMI. - VT quiescent  - Continue amiodarone  gtt 30 mg/hr for now  5. Heme: Hemorrhagic component to shock overnight with hematoma at Impella access site and RP hematoma.  CTA showed RP hematoma without active extravasation.  - No heparin  as above (HCO3 purge).  - hgb 7.7 today. Will give 1uRBCs - Transfuse hgb < 8. Follow CBC  6. Pulmonary: Possible aspiration PNA.  - Continue Unasyn .  7. Hematuria - likely local trauma. Follow. LDH ok 8. Delirium - d/w CCM  - improved with Precedex  9. Thrombocytopenia - Plats 81k. No bleeding  CRITICAL CARE Performed by: [name]  Total critical care time: 45 minutes  Critical care time was exclusive of separately billable procedures and treating other patients.  Critical care was necessary to treat or prevent imminent or life-threatening deterioration.  Critical care was time spent personally by me on the following activities: development of treatment plan with patient and/or surrogate as well as nursing, discussions with consultants, evaluation of patient's response to treatment, examination of patient, obtaining history from patient or surrogate, ordering and performing treatments and interventions, ordering and review of laboratory studies, ordering and  review of radiographic studies, pulse oximetry and re-evaluation of patient's condition.     Length of Stay: 3  [name], MD  03/06/2024, 2:10 PM  Advanced Heart Failure Team Pager [phone] (M-F; 7a - 5p)  Please contact [facility] for night-coverage after hours (5p -7a ) and weekends on [email or web address]

## 2024-03-07 ENCOUNTER — Inpatient Hospital Stay (HOSPITAL_COMMUNITY)

## 2024-03-07 DIAGNOSIS — R570 Cardiogenic shock: Secondary | ICD-10-CM | POA: Diagnosis not present

## 2024-03-07 DIAGNOSIS — I2510 Atherosclerotic heart disease of native coronary artery without angina pectoris: Secondary | ICD-10-CM | POA: Diagnosis not present

## 2024-03-07 DIAGNOSIS — J9601 Acute respiratory failure with hypoxia: Secondary | ICD-10-CM | POA: Diagnosis not present

## 2024-03-07 DIAGNOSIS — I5020 Unspecified systolic (congestive) heart failure: Secondary | ICD-10-CM | POA: Diagnosis not present

## 2024-03-07 DIAGNOSIS — I5021 Acute systolic (congestive) heart failure: Secondary | ICD-10-CM | POA: Diagnosis not present

## 2024-03-07 DIAGNOSIS — I2102 ST elevation (STEMI) myocardial infarction involving left anterior descending coronary artery: Secondary | ICD-10-CM | POA: Diagnosis not present

## 2024-03-07 DIAGNOSIS — R739 Hyperglycemia, unspecified: Secondary | ICD-10-CM

## 2024-03-07 DIAGNOSIS — Z955 Presence of coronary angioplasty implant and graft: Secondary | ICD-10-CM

## 2024-03-07 DIAGNOSIS — I2109 ST elevation (STEMI) myocardial infarction involving other coronary artery of anterior wall: Secondary | ICD-10-CM | POA: Diagnosis not present

## 2024-03-07 LAB — BASIC METABOLIC PANEL WITH GFR
Anion gap: 10 (ref 5–15)
BUN: 37 mg/dL — ABNORMAL HIGH (ref 8–23)
CO2: 21 mmol/L — ABNORMAL LOW (ref 22–32)
Calcium: 7.4 mg/dL — ABNORMAL LOW (ref 8.9–10.3)
Chloride: 99 mmol/L (ref 98–111)
Creatinine, Ser: 1.58 mg/dL — ABNORMAL HIGH (ref 0.61–1.24)
GFR, Estimated: 46 mL/min — ABNORMAL LOW (ref >60)
Glucose, Bld: 141 mg/dL — ABNORMAL HIGH (ref 70–99)
Potassium: 3.7 mmol/L (ref 3.5–5.1)
Sodium: 130 mmol/L — ABNORMAL LOW (ref 135–145)

## 2024-03-07 LAB — BPAM RBC
Blood Product Expiration Date: 202512122359
Blood Product Expiration Date: 202512152359
Blood Product Expiration Date: 202512152359
ISSUE DATE / TIME: 202511202114
ISSUE DATE / TIME: 202511202114
ISSUE DATE / TIME: 202511231547
Unit Type and Rh: 5100
Unit Type and Rh: 5100
Unit Type and Rh: 600

## 2024-03-07 LAB — CBC
HCT: 21.5 % — ABNORMAL LOW (ref 39.0–52.0)
HCT: 23.8 % — ABNORMAL LOW (ref 39.0–52.0)
Hemoglobin: 7.5 g/dL — ABNORMAL LOW (ref 13.0–17.0)
Hemoglobin: 8.4 g/dL — ABNORMAL LOW (ref 13.0–17.0)
MCH: 31.8 pg (ref 26.0–34.0)
MCH: 31.6 pg (ref 26.0–34.0)
MCHC: 34.9 g/dL (ref 30.0–36.0)
MCHC: 35.3 g/dL (ref 30.0–36.0)
MCV: 91.1 fL (ref 80.0–100.0)
MCV: 89.5 fL (ref 80.0–100.0)
Platelets: 66 K/uL — ABNORMAL LOW (ref 150–400)
Platelets: 64 K/uL — ABNORMAL LOW (ref 150–400)
RBC: 2.36 MIL/uL — ABNORMAL LOW (ref 4.22–5.81)
RBC: 2.66 MIL/uL — ABNORMAL LOW (ref 4.22–5.81)
RDW: 14.7 % (ref 11.5–15.5)
RDW: 14.9 % (ref 11.5–15.5)
WBC: 8.1 K/uL (ref 4.0–10.5)
WBC: 8.1 K/uL (ref 4.0–10.5)
nRBC: 0 % (ref 0.0–0.2)
nRBC: 0 % (ref 0.0–0.2)

## 2024-03-07 LAB — COMPREHENSIVE METABOLIC PANEL WITH GFR
ALT: 33 U/L (ref 0–44)
AST: 55 U/L — ABNORMAL HIGH (ref 15–41)
Albumin: 2.5 g/dL — ABNORMAL LOW (ref 3.5–5.0)
Alkaline Phosphatase: 32 U/L — ABNORMAL LOW (ref 38–126)
Anion gap: 10 (ref 5–15)
BUN: 36 mg/dL — ABNORMAL HIGH (ref 8–23)
CO2: 23 mmol/L (ref 22–32)
Calcium: 7.3 mg/dL — ABNORMAL LOW (ref 8.9–10.3)
Chloride: 97 mmol/L — ABNORMAL LOW (ref 98–111)
Creatinine, Ser: 1.69 mg/dL — ABNORMAL HIGH (ref 0.61–1.24)
GFR, Estimated: 42 mL/min — ABNORMAL LOW (ref >60)
Glucose, Bld: 122 mg/dL — ABNORMAL HIGH (ref 70–99)
Potassium: 3.5 mmol/L (ref 3.5–5.1)
Sodium: 130 mmol/L — ABNORMAL LOW (ref 135–145)
Total Bilirubin: 1 mg/dL (ref 0.0–1.2)
Total Protein: 5.1 g/dL — ABNORMAL LOW (ref 6.5–8.1)

## 2024-03-07 LAB — GLUCOSE, CAPILLARY
Glucose-Capillary: 124 mg/dL — ABNORMAL HIGH (ref 70–99)
Glucose-Capillary: 128 mg/dL — ABNORMAL HIGH (ref 70–99)
Glucose-Capillary: 133 mg/dL — ABNORMAL HIGH (ref 70–99)
Glucose-Capillary: 138 mg/dL — ABNORMAL HIGH (ref 70–99)
Glucose-Capillary: 144 mg/dL — ABNORMAL HIGH (ref 70–99)
Glucose-Capillary: 148 mg/dL — ABNORMAL HIGH (ref 70–99)
Glucose-Capillary: 151 mg/dL — ABNORMAL HIGH (ref 70–99)
Glucose-Capillary: 155 mg/dL — ABNORMAL HIGH (ref 70–99)

## 2024-03-07 LAB — TYPE AND SCREEN
ABO/RH(D): A NEG
Antibody Screen: NEGATIVE
Unit division: 0
Unit division: 0
Unit division: 0

## 2024-03-07 LAB — COOXEMETRY PANEL
Carboxyhemoglobin: 1.3 % (ref 0.5–1.5)
Methemoglobin: <0.7 % (ref 0.0–1.5)
O2 Saturation: 58.9 %
Total hemoglobin: 7.8 g/dL — ABNORMAL LOW (ref 12.0–16.0)

## 2024-03-07 LAB — LACTATE DEHYDROGENASE: LDH: 656 U/L — ABNORMAL HIGH (ref 105–235)

## 2024-03-07 LAB — MAGNESIUM: Magnesium: 2.1 mg/dL (ref 1.7–2.4)

## 2024-03-07 LAB — PREPARE RBC (CROSSMATCH)

## 2024-03-07 MED ORDER — METOCLOPRAMIDE HCL 5 MG/ML IJ SOLN
10.0000 mg | Freq: Three times a day (TID) | INTRAMUSCULAR | Status: AC
  Administered 2024-03-07 – 2024-03-08 (×5): 10 mg via INTRAVENOUS
  Filled 2024-03-07 (×4): qty 2

## 2024-03-07 MED ORDER — LIDOCAINE HCL URETHRAL/MUCOSAL 2 % EX GEL
1.0000 | Freq: Once | CUTANEOUS | Status: AC
  Administered 2024-03-07: 1 via URETHRAL
  Filled 2024-03-07: qty 6

## 2024-03-07 MED ORDER — DOBUTAMINE-DEXTROSE 4-5 MG/ML-% IV SOLN
2.5000 ug/kg/min | INTRAVENOUS | Status: DC
  Administered 2024-03-07 – 2024-03-09 (×2): 4 ug/kg/min via INTRAVENOUS
  Administered 2024-03-11 – 2024-03-15 (×3): 2.5 ug/kg/min via INTRAVENOUS
  Filled 2024-03-07 (×4): qty 250

## 2024-03-07 MED ORDER — SODIUM CHLORIDE 0.9% IV SOLUTION: Freq: Once | INTRAVENOUS | Status: AC

## 2024-03-07 MED ORDER — ENSURE PLUS HIGH PROTEIN PO LIQD
237.0000 mL | Freq: Three times a day (TID) | ORAL | Status: DC
  Administered 2024-03-09 – 2024-03-17 (×16): 237 mL via ORAL

## 2024-03-07 MED ORDER — SODIUM CHLORIDE 0.9 % IR SOLN
3000.0000 mL | Status: DC
  Administered 2024-03-07 – 2024-03-16 (×42): 3000 mL

## 2024-03-07 MED ORDER — SODIUM CHLORIDE 0.9% IV SOLUTION: Freq: Once | INTRAVENOUS | Status: DC

## 2024-03-07 MED ORDER — POTASSIUM CHLORIDE CRYS ER 20 MEQ PO TBCR
40.0000 meq | EXTENDED_RELEASE_TABLET | Freq: Once | ORAL | Status: AC
  Administered 2024-03-07: 40 meq via ORAL
  Filled 2024-03-07: qty 2

## 2024-03-07 MED ORDER — SIMETHICONE 80 MG PO CHEW
80.0000 mg | CHEWABLE_TABLET | Freq: Four times a day (QID) | ORAL | Status: DC | PRN
  Administered 2024-03-07: 80 mg via ORAL
  Filled 2024-03-07: qty 1

## 2024-03-07 MED ORDER — POTASSIUM CHLORIDE 10 MEQ/50ML IV SOLN
10.0000 meq | INTRAVENOUS | Status: AC
  Administered 2024-03-07 (×4): 10 meq via INTRAVENOUS
  Filled 2024-03-07 (×4): qty 50

## 2024-03-07 MED FILL — Nitroglycerin IV Soln 100 MCG/ML in D5W: INTRA_ARTERIAL | Qty: 10 | Status: AC

## 2024-03-07 NOTE — Progress Notes (Signed)
 Initial Nutrition Assessment  DOCUMENTATION CODES:   Not applicable  INTERVENTION:   Recommend changing to GI Soft (easy to digest) diet for now. Encouraged pt to take bites of food throughout the day if able (instead of eating large quantity at one time) and sips of Ensure  Increase Ensure Plus High Protein po TID, each supplement provides 350 kcal and 20 grams of protein   NUTRITION DIAGNOSIS:   Inadequate oral intake related to acute illness, nausea, decreased appetite, early satiety as evidenced by per patient/family report.  GOAL:   Patient will meet greater than or equal to 90% of their needs  MONITOR:   PO intake, Supplement acceptance, Labs, Weight trends, I & O's  REASON FOR ASSESSMENT:   Consult Assessment of nutrition requirement/status  ASSESSMENT:   73 yo male admitted with acute anterior STEMI requiring PCI to LAD, Impella CP Placement; also noted to have RP bleed. PMH includes HTN, GERD  11/20 STEMI with PCI to LAD, Impella CP placement, +RP bleed  Impella CP at P7, Flow 3.3 L. Plan to exchange for Impella 5.5 tomorrow Levophed  at 13 mcg/min Vasopressin  at 0.03 units/min DBA at 4  Currently on Heart Healthy diet; poor appetite, drinking some of the Ensure. +Nausea daily, relieved with meds.  Recorded po intake 0-50% of meals (<25% on average).   Pt in reverse trendelenburg position with Impella CP in place; unable to sit up at meal times. Pt reports inability to sit up is a big contributor to poor intake at present; pt will be able to sit up post Impella 5.5 placement.   Pt reports he feels gassy, feels bloated. +flatus, has order for simethicone . Pt would like to try a dose, discussed with RN.   Pt reports appetite was good prior to admission.   UBW around 175 pounds, current wt 88.3 kg (195 pounds). +edema on exam. Net +10 L since admission  Labs: Sodium 130 (L) Potassium 3.5 (wdl) BUN 36 Creatinine 1.69  Meds: SS novolog  Semglee  5 units  daily Semglee  IV reglan  q 8 hours x 6 doses Simethicone  prn Zofran  prn Miralax  daily  NUTRITION - FOCUSED PHYSICAL EXAM:  Flowsheet Row Most Recent Value  Orbital Region No depletion  Upper Arm Region Unable to assess  Thoracic and Lumbar Region Unable to assess  Buccal Region No depletion  Temple Region No depletion  Clavicle Bone Region No depletion  Clavicle and Acromion Bone Region No depletion  Scapular Bone Region No depletion  Dorsal Hand Unable to assess  Patellar Region Unable to assess  Anterior Thigh Region Unable to assess  Posterior Calf Region Unable to assess  Edema (RD Assessment) Moderate    Diet Order:  HEART HEALTHY  EDUCATION NEEDS:   Education needs have been addressed  Skin:  Skin Assessment: Skin Integrity Issues: Skin Integrity Issues:: Incisions Incisions: Impella CP  Last BM:  11/24 +large type 3  Height:   Ht Readings from Last 1 Encounters:  03/03/24 5' 10 (1.778 m)    Weight:   Wt Readings from Last 1 Encounters:  03/07/24 88.3 kg    BMI:  Body mass index is 27.93 kg/m.  Estimated Nutritional Needs:   Kcal:  2000-2200 kcals  Protein:  110-120 g  Fluid:  1.8 L   Betsey Finger MS, RDN, LDN, CNSC Registered Dietitian 3 Clinical Nutrition RD Inpatient Contact Info in Amion

## 2024-03-07 NOTE — H&P (View-Only) (Signed)
 [address]       [name] [address]             [phone]        Timothy Nicholson Panik [place] Medical Record #[id number] Date of Birth: Dec 19, 1950  Referring: No ref. provider found Primary Care: [name] ORN, MD Attending Cardiologist: [name], MD  Chief Complaint: Cardiogenic shock s/p anterior STEMI   History of Present Illness:      The patient is a 73 year old gentleman who presented with an acute anterior STEMI with cardiogenic shock and had an Impella CP inserted via the right femoral artery in the cath lab. 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 LM and LCX patent. LAD opened and stented and diagonal treated with PTCA. His course has been complicated by retroperitoneal bleed around the Impella sheath and hemorrhagic shock as well as gross hematuria. He is on ASA and Brilinta .   He is currently on Dobut 4, vaso 0.03 and NE 13 with Impella at P7, 3.3L flow.  He says he never had chest pain or SOB prior to this event but has been fatigued for months. He still works every day and is exhausted at the end of the day.   Past Medical History:  Diagnosis Date   Arthritis    osteo   Back pain    Benign prostatic hyperplasia with urinary obstruction and other lower urinary tract symptoms    Cancer (HCC)    maligant polyp   Dyslipidemia    Dysplastic nevus 08/13/2006   L chest pectoral - mod, excision 06.10.2008   Dysplastic nevus 09/29/2006   L groin - mild   Elevated PSA    Enlarged prostate    Family history of breast cancer    Family history of colon cancer    Family history of lung cancer    Family history of pancreatic cancer    Family history of prostate cancer    GERD (gastroesophageal reflux disease)    Headache    migraine without aura   HOH (hard of hearing)    Hypercholesterolemia    Hypertension    Personal history of colon cancer 01/19/2018   Scoliosis      Past Surgical History:  Procedure Laterality Date   COLONOSCOPY  08/27/2010   Dr. Gaylyn Tubular adenomas, FH polyps   COLONOSCOPY     COLONOSCOPY WITH PROPOFOL  N/A 11/19/2017   Procedure: COLONOSCOPY WITH PROPOFOL ;  Surgeon: Gaylyn Gladis PENNER, MD;  Location: [facility] ENDOSCOPY;  Service: Endoscopy;  Laterality: N/A;   COLONOSCOPY WITH PROPOFOL  N/A 04/29/2018   Procedure: COLONOSCOPY WITH PROPOFOL ;  Surgeon: Gaylyn Gladis PENNER, MD;  Location: [facility] ENDOSCOPY;  Service: Endoscopy;  Laterality: N/A;   COLONOSCOPY WITH PROPOFOL  N/A 04/30/2018   Procedure: COLONOSCOPY WITH PROPOFOL ;  Surgeon: Gaylyn Gladis PENNER, MD;  Location: [facility] ENDOSCOPY;  Service: Endoscopy;  Laterality: N/A;   COLONOSCOPY WITH PROPOFOL  N/A 05/31/2018   Procedure: COLONOSCOPY WITH PROPOFOL ;  Surgeon: Gaylyn Gladis PENNER, MD;  Location: [facility] ENDOSCOPY;  Service: Endoscopy;  Laterality: N/A;   COLONOSCOPY WITH PROPOFOL  N/A 09/17/2018   Procedure: COLONOSCOPY WITH PROPOFOL ;  Surgeon: Gaylyn Gladis PENNER, MD;  Location: [facility] ENDOSCOPY;  Service: Endoscopy;  Laterality: N/A;   COLONOSCOPY WITH PROPOFOL  N/A 06/25/2020   Procedure: COLONOSCOPY WITH PROPOFOL ;  Surgeon: Maryruth Ole DASEN, MD;  Location: [facility] ENDOSCOPY;  Service: Endoscopy;  Laterality: N/A;   CORONARY/GRAFT ACUTE  MI REVASCULARIZATION N/A 03/03/2024   Procedure: Coronary/Graft Acute MI Revascularization;  Surgeon: Wonda Sharper, MD;  Location: [facility] INVASIVE CV LAB;  Service: Cardiovascular;  Laterality: N/A;   ESOPHAGOGASTRODUODENOSCOPY (EGD) WITH PROPOFOL  N/A 11/19/2017   Procedure: ESOPHAGOGASTRODUODENOSCOPY (EGD) WITH PROPOFOL ;  Surgeon: Gaylyn Gladis PENNER, MD;  Location: [facility] ENDOSCOPY;  Service: Endoscopy;  Laterality: N/A;   GAS INSERTION Left 09/20/2019   Procedure: INSERTION OF GAS;  Surgeon: Alvia Norleen BIRCH, MD;  Location: [facility] OR;  Service: Ophthalmology;  Laterality: Left;   LASER PHOTO ABLATION Left 09/20/2019   Procedure: Laser Photo Ablation;  Surgeon: Alvia Norleen BIRCH, MD;  Location: [facility] OR;  Service: Ophthalmology;  Laterality: Left;   LEFT HEART CATH AND CORONARY ANGIOGRAPHY N/A 03/03/2024   Procedure: LEFT HEART CATH AND CORONARY ANGIOGRAPHY;  Surgeon: Wonda Sharper, MD;  Location: [facility] INVASIVE CV LAB;  Service: Cardiovascular;  Laterality: N/A;   MEMBRANE PEEL Left 09/20/2019   Procedure: Ottie Booty;  Surgeon: Alvia Norleen BIRCH, MD;  Location: [facility] OR;  Service: Ophthalmology;  Laterality: Left;   PARS PLANA VITRECTOMY W/ SCLERAL BUCKLE Left 09/20/2019   Procedure: 25 Gauge PARS PLANA VITRECTOMY WITH LASER FOR MACULAR HOLE;  Surgeon: Alvia Norleen BIRCH, MD;  Location: [facility] OR;  Service: Ophthalmology;  Laterality: Left;   RIGHT HEART CATH N/A 03/03/2024   Procedure: RIGHT HEART CATH;  Surgeon: Wonda Sharper, MD;  Location: [facility] INVASIVE CV LAB;  Service: Cardiovascular;  Laterality: N/A;   SERUM PATCH Left 09/20/2019   Procedure: SERUM PATCH;  Surgeon: Alvia Norleen BIRCH, MD;  Location: [facility] OR;  Service: Ophthalmology;  Laterality: Left;   TONSILLECTOMY     VENTRICULAR ASSIST DEVICE INSERTION N/A 03/03/2024   Procedure: VENTRICULAR ASSIST DEVICE INSERTION;  Surgeon: Wonda Sharper, MD;  Location: [facility] INVASIVE CV LAB;  Service: Cardiovascular;  Laterality: N/A;   VITRECTOMY Left 09/20/2019    25 Gauge PARS PLANA VITRECTOMY WITH LASER FOR MACULAR HOLE (Left Eye)    Social History   Tobacco Use  Smoking Status Never  Smokeless Tobacco Never    Social History   Substance and Sexual Activity  Alcohol Use Not Currently     Allergies  Allergen Reactions   Sulfa Antibiotics Rash    Current Facility-Administered Medications  Medication Dose Route Frequency Provider Last Rate Last Admin   acetaminophen  (TYLENOL ) tablet 650 mg  650 mg Oral Q4H PRN Cooper, Michael, MD   650 mg at 03/07/24 1208   amiodarone  (NEXTERONE  PREMIX) 360-4.14 MG/200ML-% (1.8 mg/mL) IV infusion  30 mg/hr Intravenous Continuous Bensimhon, Toribio SAUNDERS, MD 16.67 mL/hr at 03/07/24 1415 30  mg/hr at 03/07/24 1415   Ampicillin -Sulbactam (UNASYN ) 3 g in sodium chloride  0.9 % 100 mL IVPB  3 g Intravenous Q6H Gretta Leita SQUIBB, DO   Stopped at 03/07/24 1131   aspirin  chewable tablet 81 mg  81 mg Oral Daily Wonda Sharper, MD   81 mg at 03/07/24 9089   Chlorhexidine  Gluconate Cloth 2 % PADS 6 each  6 each Topical Daily McLean, Dalton S, MD   6 each at 03/07/24 9049   DOBUTamine  (DOBUTREX ) infusion 4000 mcg/mL  4 mcg/kg/min Intravenous Continuous Rolan Ezra RAMAN, MD 5.34 mL/hr at 03/07/24 1400 4 mcg/kg/min at 03/07/24 1400   feeding supplement (ENSURE PLUS HIGH PROTEIN) liquid 237 mL  237 mL Oral BID BM Bensimhon, Toribio SAUNDERS, MD   237 mL at 03/07/24 0945   finasteride  (PROSCAR ) tablet 5 mg  5 mg Oral Daily Colletta Manuelita Garre, PA-C   5 mg  at 03/07/24 0910   HYDROmorphone  (DILAUDID ) injection 0.5 mg  0.5 mg Intravenous Q4H PRN Gretta Doffing P, DO       insulin  aspart (novoLOG ) injection 0-15 Units  0-15 Units Subcutaneous Q4H Hicks, Samantha B, NP   2 Units at 03/07/24 1130   insulin  glargine-yfgn (SEMGLEE ) injection 5 Units  5 Units Subcutaneous Daily Gretta Doffing SQUIBB, DO   5 Units at 03/07/24 9088   iohexol  (OMNIPAQUE ) 350 MG/ML injection    PRN Wonda Sharper, MD   200 mL at 03/03/24 1656   lidocaine  (XYLOCAINE ) 2 % jelly 1 Application  1 Application Urethral Once Smith, Daniel C, MD       melatonin tablet 3 mg  3 mg Oral QHS PRN Debarah Donley SAILOR, MD   3 mg at 03/04/24 2035   metoCLOPramide  (REGLAN ) injection 10 mg  10 mg Intravenous Q8H Claudene Toribio BROCKS, MD   10 mg at 03/07/24 9192   norepinephrine  (LEVOPHED ) 4mg  in (0.016 mg/mL) premix infusion  0-40 mcg/min Intravenous Titrated Bensimhon, Toribio SAUNDERS, MD 48.8 mL/hr at 03/07/24 1400 13 mcg/min at 03/07/24 1400   ondansetron  (ZOFRAN ) injection 4 mg  4 mg Intravenous Q6H PRN Colletta Manuelita Garre, PA-C   4 mg at 03/07/24 9192   Oral care mouth rinse  15 mL Mouth Rinse 4 times per day Rolan Ezra RAMAN, MD   15 mL at 03/07/24 1147   Oral care  mouth rinse  15 mL Mouth Rinse PRN Rolan Ezra RAMAN, MD       oxyCODONE  (Oxy IR/ROXICODONE ) immediate release tablet 5-10 mg  5-10 mg Oral Q4H PRN Wonda Sharper, MD       pantoprazole  (PROTONIX ) EC tablet 40 mg  40 mg Oral Daily Colletta Manuelita Garre, PA-C   40 mg at 03/07/24 0910   polyethylene glycol (MIRALAX  / GLYCOLAX ) packet 17 g  17 g Oral Daily Gretta Doffing SQUIBB, DO   17 g at 03/07/24 9089   promethazine  (PHENERGAN ) 12.5 mg in sodium chloride  0.9 % 50 mL IVPB  12.5 mg Intravenous Q6H PRN McLean, Dalton S, MD   Paused at 03/03/24 2233   rosuvastatin  (CRESTOR ) tablet 20 mg  20 mg Oral Daily Wonda Sharper, MD   20 mg at 03/07/24 9089   simethicone  (MYLICON) chewable tablet 80 mg  80 mg Oral QID PRN Claudene Toribio BROCKS, MD       sodium bicarbonate  25 mEq (Impella PURGE) in dextrose  5 % 1000 mL bag   Intracatheter Continuous Wonda Sharper, MD   New Bag at 03/06/24 0238   sodium chloride  flush (NS) 0.9 % injection 3 mL  3 mL Intravenous Q12H Cooper, Michael, MD   3 mL at 03/07/24 9040   sodium chloride  flush (NS) 0.9 % injection 3 mL  3 mL Intravenous PRN Wonda Sharper, MD       sodium chloride  irrigation 0.9 % 3,000 mL  3,000 mL Irrigation Continuous Smith, Daniel C, MD       tamsulosin  (FLOMAX ) capsule 0.4 mg  0.4 mg Oral Daily Colletta Manuelita Garre, PA-C   0.4 mg at 03/07/24 9089   ticagrelor  (BRILINTA ) tablet 90 mg  90 mg Oral BID McLean, Dalton S, MD   90 mg at 03/07/24 9089   vasopressin  (PITRESSIN) 20 Units in 100 mL (0.2 unit/mL) infusion-*FOR SHOCK*  0-0.03 Units/min Intravenous Continuous Bensimhon, Toribio SAUNDERS, MD 9 mL/hr at 03/07/24 1400 0.03 Units/min at 03/07/24 1400    Medications Prior to Admission  Medication Sig Dispense Refill Last  Dose/Taking   finasteride  (PROSCAR ) 5 MG tablet Take 5 mg by mouth daily.   03/03/2024 Morning   lisinopril -hydrochlorothiazide  (PRINZIDE ,ZESTORETIC ) 10-12.5 MG tablet Take 1 tablet by mouth daily.   03/03/2024 Morning   Multiple Vitamins-Minerals  (MULTIVITAMIN WITH MINERALS) tablet Take 1 tablet by mouth daily.   03/03/2024 Morning   pantoprazole  (PROTONIX ) 40 MG tablet Take 40 mg by mouth daily.   03/03/2024 Morning   prednisoLONE  acetate (PRED FORTE ) 1 % ophthalmic suspension Place 1 drop into the left eye 4 (four) times daily. (Patient taking differently: Place 1 drop into the left eye daily as needed (eye inflamation).) 5 mL 0 Past Week   rosuvastatin  (CRESTOR ) 10 MG tablet Take 10 mg by mouth daily.   03/03/2024 Morning   tamsulosin  (FLOMAX ) 0.4 MG CAPS capsule Take 0.4 mg by mouth daily.    03/03/2024 Morning   acetaminophen  (TYLENOL ) 500 MG tablet Take 500 mg by mouth every 6 (six) hours as needed for mild pain or moderate pain.   Unknown   bacitracin -polymyxin b  (POLYSPORIN ) ophthalmic ointment Place 1 application into the left eye 3 (three) times daily. apply to eye every 12 hours while awake (Patient not taking: Reported on 03/03/2024) 3.5 g 0 Not Taking    Family History  Problem Relation Age of Onset   Breast cancer Mother 23   Lung cancer Father 39       hx of smoking   Colon polyps Father        'had 9 polyps on his last colonocospy'   Colon cancer Maternal Uncle 75   Lung cancer Paternal Aunt 23       hx smoking   Pancreatic cancer Paternal Uncle        hx of alcohol abuse   Heart disease Maternal Grandfather    Aortic aneurysm Maternal Grandfather    Kidney failure Paternal Grandmother    Prostate cancer Maternal Uncle        metastatic, died of this cancer in 70's/80's   Cirrhosis Paternal Uncle        hx of alcohol abuse   Breast cancer Cousin        90's   Breast cancer Cousin        50's   Breast cancer Cousin        50's     Review of Systems:   Review of Systems  Constitutional:  Positive for malaise/fatigue.  HENT: Negative.    Eyes: Negative.   Respiratory:  Negative for shortness of breath.   Cardiovascular:  Positive for chest pain. Negative for orthopnea and leg swelling.   Gastrointestinal: Negative.   Genitourinary: Negative.   Musculoskeletal:  Positive for back pain.  Skin: Negative.   Neurological:  Negative for dizziness.  Endo/Heme/Allergies: Negative.   Psychiatric/Behavioral: Negative.     Physical Exam: BP 132/82   Pulse 81   Temp 99.3 F (37.4 C)   Resp (!) 23   Ht 5' 10 (1.778 m)   Wt 88.3 kg   SpO2 91%   BMI 27.93 kg/m    General appearance: alert and cooperative Head: Normocephalic, without obvious abnormality, atraumatic Neck:  no carotid bruit,  right internal jugular swan Cardio: regular rate and rhythm, S1, S2 normal, no murmur GI: soft, non-tender; bowel sounds normal; no masses,  no organomegaly Extremities: edema moderate in ankles and feet. Neurologic: Grossly normal  Diagnostic Studies & Laboratory data:     Recent Radiology Findings:   DG Abd 1 View Result  Date: 03/07/2024 CLINICAL DATA:  Ileus EXAM: ABDOMEN - 1 VIEW COMPARISON:  CT scan 03/03/2024. FINDINGS: Mild gaseous distention of the stomach. Diffuse gas-filled small bowel evident with a nonobstructive appearance. Gas in stool is seen scattered along the length of a nondilated colon. Prominent stool volume in the rectum. IMPRESSION: 1. Mild gaseous distention of the stomach with diffuse gas-filled small bowel in a nonobstructive pattern. 2. Prominent stool volume in the rectum. Electronically Signed   By: Camellia Candle M.D.   On: 03/07/2024 07:33   DG CHEST PORT 1 VIEW Result Date: 03/06/2024 CLINICAL DATA:  [id number] Hemoptysis [id number] EXAM: PORTABLE CHEST 1 VIEW COMPARISON:  March 05, 2024 FINDINGS: The cardiomediastinal silhouette is unchanged in contour.Similar positioning of an Impella device. RIGHT neck PA catheter tip terminates over the RIGHT inferior pulmonary artery. No pleural effusion. No pneumothorax. Similar appearance of patchy perihilar predominant opacities. IMPRESSION: Similar appearance of patchy perihilar predominant opacities. Electronically  Signed   By: Corean Salter M.D.   On: 03/06/2024 15:23   DG CHEST PORT 1 VIEW Result Date: 03/05/2024 CLINICAL DATA:  [id number] Hypoxia [id number] EXAM: PORTABLE CHEST 1 VIEW COMPARISON:  March 03, 2024 FINDINGS: The cardiomediastinal silhouette is unchanged and enlarged in contour. Similar pulmonary artery enlargement. RIGHT IJ PA catheter tip terminating over the RIGHT pulmonary artery. Lower extremity approach Impella device in similar position projecting over the LEFT ventricle and outflow tract to the ascending thoracic aorta. No pleural effusion. No pneumothorax. Similar patchy perihilar predominant airspace opacities. IMPRESSION: Similar patchy perihilar predominant airspace opacities. Electronically Signed   By: Corean Salter M.D.   On: 03/05/2024 17:29     I have independently reviewed the above radiologic studies and discussed with the patient   Recent Lab Findings: Lab Results  Component Value Date   WBC 8.1 03/07/2024   HGB 7.5 (L) 03/07/2024   HCT 21.5 (L) 03/07/2024   PLT 66 (L) 03/07/2024   GLUCOSE 122 (H) 03/07/2024   CHOL 95 03/04/2024   TRIG 43 03/04/2024   HDL 41 03/04/2024   LDLCALC 45 03/04/2024   ALT 33 03/07/2024   AST 55 (H) 03/07/2024   NA 130 (L) 03/07/2024   K 3.5 03/07/2024   CL 97 (L) 03/07/2024   CREATININE 1.69 (H) 03/07/2024   BUN 36 (H) 03/07/2024   CO2 23 03/07/2024   INR 1.0 03/03/2024   HGBA1C 5.6 03/03/2024      Assessment / Plan:      Cardiogenic shock dependent on inotropes/vasopressors/Impella CP s/p anterior STEMI with EF 25% by echo. I agree with conversion to Impella 5.5 via the right axillary artery to allow continued support while getting him up out of bed and mobilized. I discussed the operative procedure with the patient and family including alternatives, benefits and risks; including but not limited to bleeding, blood transfusion, infection, stroke, myocardial infarction, heart block requiring a permanent pacemaker, organ  dysfunction, and death.  Milton Sagona Kintzel understands and agrees to proceed.  We will schedule surgery for tomorrow afternoon.  Dorise LOIS Fellers, MD   03/07/2024 2:35 PM

## 2024-03-07 NOTE — Progress Notes (Signed)
 [facility] ADULT ICU REPLACEMENT PROTOCOL   The patient does apply for the [facility] Adult ICU Electrolyte Replacment Protocol based on the criteria listed below:   1.Exclusion criteria: TCTS, ECMO, Dialysis, and Myasthenia Gravis patients 2. Is GFR >/= 30 ml/min? Yes.    Patient's GFR today is 42 3. Is SCr </= 2? Yes.   Patient's SCr is 1.69 mg/dL 4. Did SCr increase >/= 0.5 in 24 hours? No. 5.Pt's weight >40kg  Yes.   6. Abnormal electrolyte(s): K+ 3.5  7. Electrolytes replaced per protocol 8.  Call MD STAT for K+ </= 2.5, Phos </= 1, or Mag </= 1 Physician:  Dr. Haze Rummer, [place] ORN 03/07/2024 6:04 AM

## 2024-03-07 NOTE — Consult Note (Signed)
 [address]       [name] [id number]             [phone]        Timothy Nicholson Date of Birth: Dec 19, 1950  Referring: No ref. provider found Primary Care: [name] ORN, MD Attending Cardiologist: [name], MD  Chief Complaint: Cardiogenic shock s/p anterior STEMI   History of Present Illness:      The patient is a 73 year old gentleman who presented with an acute anterior STEMI with cardiogenic shock and had an Impella CP inserted via the right femoral artery in the cath lab. 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 LM and LCX patent. LAD opened and stented and diagonal treated with PTCA. His course has been complicated by retroperitoneal bleed around the Impella sheath and hemorrhagic shock as well as gross hematuria. He is on ASA and Brilinta .   He is currently on Dobut 4, vaso 0.03 and NE 13 with Impella at P7, 3.3L flow.  He says he never had chest pain or SOB prior to this event but has been fatigued for months. He still works every day and is exhausted at the end of the day.   Past Medical History:  Diagnosis Date   Arthritis    osteo   Back pain    Benign prostatic hyperplasia with urinary obstruction and other lower urinary tract symptoms    Cancer (HCC)    maligant polyp   Dyslipidemia    Dysplastic nevus 08/13/2006   L chest pectoral - mod, excision 06.10.2008   Dysplastic nevus 09/29/2006   L groin - mild   Elevated PSA    Enlarged prostate    Family history of breast cancer    Family history of colon cancer    Family history of lung cancer    Family history of pancreatic cancer    Family history of prostate cancer    GERD (gastroesophageal reflux disease)    Headache    migraine without aura   HOH (hard of hearing)    Hypercholesterolemia    Hypertension    Personal history of colon cancer 01/19/2018   Scoliosis      Past Surgical History:  Procedure Laterality Date   COLONOSCOPY  08/27/2010   Dr. Gaylyn Tubular adenomas, FH polyps   COLONOSCOPY     COLONOSCOPY WITH PROPOFOL  N/A 11/19/2017   Procedure: COLONOSCOPY WITH PROPOFOL ;  Surgeon: Gaylyn Gladis PENNER, MD;  Location: [facility] ENDOSCOPY;  Service: Endoscopy;  Laterality: N/A;   COLONOSCOPY WITH PROPOFOL  N/A 04/29/2018   Procedure: COLONOSCOPY WITH PROPOFOL ;  Surgeon: Gaylyn Gladis PENNER, MD;  Location: [facility] ENDOSCOPY;  Service: Endoscopy;  Laterality: N/A;   COLONOSCOPY WITH PROPOFOL  N/A 04/30/2018   Procedure: COLONOSCOPY WITH PROPOFOL ;  Surgeon: Gaylyn Gladis PENNER, MD;  Location: [facility] ENDOSCOPY;  Service: Endoscopy;  Laterality: N/A;   COLONOSCOPY WITH PROPOFOL  N/A 05/31/2018   Procedure: COLONOSCOPY WITH PROPOFOL ;  Surgeon: Gaylyn Gladis PENNER, MD;  Location: [facility] ENDOSCOPY;  Service: Endoscopy;  Laterality: N/A;   COLONOSCOPY WITH PROPOFOL  N/A 09/17/2018   Procedure: COLONOSCOPY WITH PROPOFOL ;  Surgeon: Gaylyn Gladis PENNER, MD;  Location: [facility] ENDOSCOPY;  Service: Endoscopy;  Laterality: N/A;   COLONOSCOPY WITH PROPOFOL  N/A 06/25/2020   Procedure: COLONOSCOPY WITH PROPOFOL ;  Surgeon: Maryruth Ole DASEN, MD;  Location: [facility] ENDOSCOPY;  Service: Endoscopy;  Laterality: N/A;   CORONARY/GRAFT ACUTE  MI REVASCULARIZATION N/A 03/03/2024   Procedure: Coronary/Graft Acute MI Revascularization;  Surgeon: Wonda Sharper, MD;  Location: [facility] INVASIVE CV LAB;  Service: Cardiovascular;  Laterality: N/A;   ESOPHAGOGASTRODUODENOSCOPY (EGD) WITH PROPOFOL  N/A 11/19/2017   Procedure: ESOPHAGOGASTRODUODENOSCOPY (EGD) WITH PROPOFOL ;  Surgeon: Gaylyn Gladis PENNER, MD;  Location: [facility] ENDOSCOPY;  Service: Endoscopy;  Laterality: N/A;   GAS INSERTION Left 09/20/2019   Procedure: INSERTION OF GAS;  Surgeon: Alvia Norleen BIRCH, MD;  Location: [facility] OR;  Service: Ophthalmology;  Laterality: Left;   LASER PHOTO ABLATION Left 09/20/2019   Procedure: Laser Photo Ablation;  Surgeon: Alvia Norleen BIRCH, MD;  Location: [facility] OR;  Service: Ophthalmology;  Laterality: Left;   LEFT HEART CATH AND CORONARY ANGIOGRAPHY N/A 03/03/2024   Procedure: LEFT HEART CATH AND CORONARY ANGIOGRAPHY;  Surgeon: Wonda Sharper, MD;  Location: [facility] INVASIVE CV LAB;  Service: Cardiovascular;  Laterality: N/A;   MEMBRANE PEEL Left 09/20/2019   Procedure: Ottie Booty;  Surgeon: Alvia Norleen BIRCH, MD;  Location: [facility] OR;  Service: Ophthalmology;  Laterality: Left;   PARS PLANA VITRECTOMY W/ SCLERAL BUCKLE Left 09/20/2019   Procedure: 25 Gauge PARS PLANA VITRECTOMY WITH LASER FOR MACULAR HOLE;  Surgeon: Alvia Norleen BIRCH, MD;  Location: [facility] OR;  Service: Ophthalmology;  Laterality: Left;   RIGHT HEART CATH N/A 03/03/2024   Procedure: RIGHT HEART CATH;  Surgeon: Wonda Sharper, MD;  Location: [facility] INVASIVE CV LAB;  Service: Cardiovascular;  Laterality: N/A;   SERUM PATCH Left 09/20/2019   Procedure: SERUM PATCH;  Surgeon: Alvia Norleen BIRCH, MD;  Location: [facility] OR;  Service: Ophthalmology;  Laterality: Left;   TONSILLECTOMY     VENTRICULAR ASSIST DEVICE INSERTION N/A 03/03/2024   Procedure: VENTRICULAR ASSIST DEVICE INSERTION;  Surgeon: Wonda Sharper, MD;  Location: [facility] INVASIVE CV LAB;  Service: Cardiovascular;  Laterality: N/A;   VITRECTOMY Left 09/20/2019    25 Gauge PARS PLANA VITRECTOMY WITH LASER FOR MACULAR HOLE (Left Eye)    Social History   Tobacco Use  Smoking Status Never  Smokeless Tobacco Never    Social History   Substance and Sexual Activity  Alcohol Use Not Currently     Allergies  Allergen Reactions   Sulfa Antibiotics Rash    Current Facility-Administered Medications  Medication Dose Route Frequency Provider Last Rate Last Admin   acetaminophen  (TYLENOL ) tablet 650 mg  650 mg Oral Q4H PRN Cooper, Michael, MD   650 mg at 03/07/24 1208   amiodarone  (NEXTERONE  PREMIX) 360-4.14 MG/200ML-% (1.8 mg/mL) IV infusion  30 mg/hr Intravenous Continuous Bensimhon, Toribio SAUNDERS, MD 16.67 mL/hr at 03/07/24 1415 30  mg/hr at 03/07/24 1415   Ampicillin -Sulbactam (UNASYN ) 3 g in sodium chloride  0.9 % 100 mL IVPB  3 g Intravenous Q6H Gretta Leita SQUIBB, DO   Stopped at 03/07/24 1131   aspirin  chewable tablet 81 mg  81 mg Oral Daily Wonda Sharper, MD   81 mg at 03/07/24 9089   Chlorhexidine  Gluconate Cloth 2 % PADS 6 each  6 each Topical Daily McLean, Dalton S, MD   6 each at 03/07/24 9049   DOBUTamine  (DOBUTREX ) infusion 4000 mcg/mL  4 mcg/kg/min Intravenous Continuous Rolan Ezra RAMAN, MD 5.34 mL/hr at 03/07/24 1400 4 mcg/kg/min at 03/07/24 1400   feeding supplement (ENSURE PLUS HIGH PROTEIN) liquid 237 mL  237 mL Oral BID BM Bensimhon, Toribio SAUNDERS, MD   237 mL at 03/07/24 0945   finasteride  (PROSCAR ) tablet 5 mg  5 mg Oral Daily Colletta Manuelita Garre, PA-C   5 mg  at 03/07/24 0910   HYDROmorphone  (DILAUDID ) injection 0.5 mg  0.5 mg Intravenous Q4H PRN Gretta Doffing P, DO       insulin  aspart (novoLOG ) injection 0-15 Units  0-15 Units Subcutaneous Q4H Hicks, Samantha B, NP   2 Units at 03/07/24 1130   insulin  glargine-yfgn (SEMGLEE ) injection 5 Units  5 Units Subcutaneous Daily Gretta Doffing SQUIBB, DO   5 Units at 03/07/24 9088   iohexol  (OMNIPAQUE ) 350 MG/ML injection    PRN Cooper, Michael, MD   200 mL at 03/03/24 1656   lidocaine  (XYLOCAINE ) 2 % jelly 1 Application  1 Application Urethral Once Smith, Daniel C, MD       melatonin tablet 3 mg  3 mg Oral QHS PRN Debarah Donley SAILOR, MD   3 mg at 03/04/24 2035   metoCLOPramide  (REGLAN ) injection 10 mg  10 mg Intravenous Q8H Claudene Toribio BROCKS, MD   10 mg at 03/07/24 9192   norepinephrine  (LEVOPHED ) 4mg  in (0.016 mg/mL) premix infusion  0-40 mcg/min Intravenous Titrated Bensimhon, Toribio SAUNDERS, MD 48.8 mL/hr at 03/07/24 1400 13 mcg/min at 03/07/24 1400   ondansetron  (ZOFRAN ) injection 4 mg  4 mg Intravenous Q6H PRN Colletta Manuelita Garre, PA-C   4 mg at 03/07/24 9192   Oral care mouth rinse  15 mL Mouth Rinse 4 times per day Rolan Ezra RAMAN, MD   15 mL at 03/07/24 1147   Oral care  mouth rinse  15 mL Mouth Rinse PRN Rolan Ezra RAMAN, MD       oxyCODONE  (Oxy IR/ROXICODONE ) immediate release tablet 5-10 mg  5-10 mg Oral Q4H PRN Wonda Sharper, MD       pantoprazole  (PROTONIX ) EC tablet 40 mg  40 mg Oral Daily Colletta Manuelita Garre, PA-C   40 mg at 03/07/24 0910   polyethylene glycol (MIRALAX  / GLYCOLAX ) packet 17 g  17 g Oral Daily Gretta Doffing SQUIBB, DO   17 g at 03/07/24 9089   promethazine  (PHENERGAN ) 12.5 mg in sodium chloride  0.9 % 50 mL IVPB  12.5 mg Intravenous Q6H PRN McLean, Dalton S, MD   Paused at 03/03/24 2233   rosuvastatin  (CRESTOR ) tablet 20 mg  20 mg Oral Daily Cooper, Michael, MD   20 mg at 03/07/24 9089   simethicone  (MYLICON) chewable tablet 80 mg  80 mg Oral QID PRN Claudene Toribio BROCKS, MD       sodium bicarbonate  25 mEq (Impella PURGE) in dextrose  5 % 1000 mL bag   Intracatheter Continuous Wonda Sharper, MD   New Bag at 03/06/24 0238   sodium chloride  flush (NS) 0.9 % injection 3 mL  3 mL Intravenous Q12H Cooper, Michael, MD   3 mL at 03/07/24 9040   sodium chloride  flush (NS) 0.9 % injection 3 mL  3 mL Intravenous PRN Wonda Sharper, MD       sodium chloride  irrigation 0.9 % 3,000 mL  3,000 mL Irrigation Continuous Smith, Daniel C, MD       tamsulosin  (FLOMAX ) capsule 0.4 mg  0.4 mg Oral Daily Colletta Manuelita Garre, PA-C   0.4 mg at 03/07/24 9089   ticagrelor  (BRILINTA ) tablet 90 mg  90 mg Oral BID McLean, Dalton S, MD   90 mg at 03/07/24 9089   vasopressin  (PITRESSIN) 20 Units in 100 mL (0.2 unit/mL) infusion-*FOR SHOCK*  0-0.03 Units/min Intravenous Continuous Bensimhon, Toribio SAUNDERS, MD 9 mL/hr at 03/07/24 1400 0.03 Units/min at 03/07/24 1400    Medications Prior to Admission  Medication Sig Dispense Refill Last  Dose/Taking   finasteride  (PROSCAR ) 5 MG tablet Take 5 mg by mouth daily.   03/03/2024 Morning   lisinopril -hydrochlorothiazide  (PRINZIDE ,ZESTORETIC ) 10-12.5 MG tablet Take 1 tablet by mouth daily.   03/03/2024 Morning   Multiple Vitamins-Minerals  (MULTIVITAMIN WITH MINERALS) tablet Take 1 tablet by mouth daily.   03/03/2024 Morning   pantoprazole  (PROTONIX ) 40 MG tablet Take 40 mg by mouth daily.   03/03/2024 Morning   prednisoLONE  acetate (PRED FORTE ) 1 % ophthalmic suspension Place 1 drop into the left eye 4 (four) times daily. (Patient taking differently: Place 1 drop into the left eye daily as needed (eye inflamation).) 5 mL 0 Past Week   rosuvastatin  (CRESTOR ) 10 MG tablet Take 10 mg by mouth daily.   03/03/2024 Morning   tamsulosin  (FLOMAX ) 0.4 MG CAPS capsule Take 0.4 mg by mouth daily.    03/03/2024 Morning   acetaminophen  (TYLENOL ) 500 MG tablet Take 500 mg by mouth every 6 (six) hours as needed for mild pain or moderate pain.   Unknown   bacitracin -polymyxin b  (POLYSPORIN ) ophthalmic ointment Place 1 application into the left eye 3 (three) times daily. apply to eye every 12 hours while awake (Patient not taking: Reported on 03/03/2024) 3.5 g 0 Not Taking    Family History  Problem Relation Age of Onset   Breast cancer Mother 23   Lung cancer Father 39       hx of smoking   Colon polyps Father        'had 9 polyps on his last colonocospy'   Colon cancer Maternal Uncle 75   Lung cancer Paternal Aunt 23       hx smoking   Pancreatic cancer Paternal Uncle        hx of alcohol abuse   Heart disease Maternal Grandfather    Aortic aneurysm Maternal Grandfather    Kidney failure Paternal Grandmother    Prostate cancer Maternal Uncle        metastatic, died of this cancer in 70's/80's   Cirrhosis Paternal Uncle        hx of alcohol abuse   Breast cancer Cousin        90's   Breast cancer Cousin        50's   Breast cancer Cousin        50's     Review of Systems:   Review of Systems  Constitutional:  Positive for malaise/fatigue.  HENT: Negative.    Eyes: Negative.   Respiratory:  Negative for shortness of breath.   Cardiovascular:  Positive for chest pain. Negative for orthopnea and leg swelling.   Gastrointestinal: Negative.   Genitourinary: Negative.   Musculoskeletal:  Positive for back pain.  Skin: Negative.   Neurological:  Negative for dizziness.  Endo/Heme/Allergies: Negative.   Psychiatric/Behavioral: Negative.     Physical Exam: BP 132/82   Pulse 81   Temp 99.3 F (37.4 C)   Resp (!) 23   Ht 5' 10 (1.778 m)   Wt 88.3 kg   SpO2 91%   BMI 27.93 kg/m    General appearance: alert and cooperative Head: Normocephalic, without obvious abnormality, atraumatic Neck:  no carotid bruit,  right internal jugular swan Cardio: regular rate and rhythm, S1, S2 normal, no murmur GI: soft, non-tender; bowel sounds normal; no masses,  no organomegaly Extremities: edema moderate in ankles and feet. Neurologic: Grossly normal  Diagnostic Studies & Laboratory data:     Recent Radiology Findings:   DG Abd 1 View Result  Date: 03/07/2024 CLINICAL DATA:  Ileus EXAM: ABDOMEN - 1 VIEW COMPARISON:  CT scan 03/03/2024. FINDINGS: Mild gaseous distention of the stomach. Diffuse gas-filled small bowel evident with a nonobstructive appearance. Gas in stool is seen scattered along the length of a nondilated colon. Prominent stool volume in the rectum. IMPRESSION: 1. Mild gaseous distention of the stomach with diffuse gas-filled small bowel in a nonobstructive pattern. 2. Prominent stool volume in the rectum. Electronically Signed   By: Camellia Candle M.D.   On: 03/07/2024 07:33   DG CHEST PORT 1 VIEW Result Date: 03/06/2024 CLINICAL DATA:  [id number] Hemoptysis [id number] EXAM: PORTABLE CHEST 1 VIEW COMPARISON:  March 05, 2024 FINDINGS: The cardiomediastinal silhouette is unchanged in contour.Similar positioning of an Impella device. RIGHT neck PA catheter tip terminates over the RIGHT inferior pulmonary artery. No pleural effusion. No pneumothorax. Similar appearance of patchy perihilar predominant opacities. IMPRESSION: Similar appearance of patchy perihilar predominant opacities. Electronically  Signed   By: Corean Salter M.D.   On: 03/06/2024 15:23   DG CHEST PORT 1 VIEW Result Date: 03/05/2024 CLINICAL DATA:  [id number] Hypoxia [id number] EXAM: PORTABLE CHEST 1 VIEW COMPARISON:  March 03, 2024 FINDINGS: The cardiomediastinal silhouette is unchanged and enlarged in contour. Similar pulmonary artery enlargement. RIGHT IJ PA catheter tip terminating over the RIGHT pulmonary artery. Lower extremity approach Impella device in similar position projecting over the LEFT ventricle and outflow tract to the ascending thoracic aorta. No pleural effusion. No pneumothorax. Similar patchy perihilar predominant airspace opacities. IMPRESSION: Similar patchy perihilar predominant airspace opacities. Electronically Signed   By: Corean Salter M.D.   On: 03/05/2024 17:29     I have independently reviewed the above radiologic studies and discussed with the patient   Recent Lab Findings: Lab Results  Component Value Date   WBC 8.1 03/07/2024   HGB 7.5 (L) 03/07/2024   HCT 21.5 (L) 03/07/2024   PLT 66 (L) 03/07/2024   GLUCOSE 122 (H) 03/07/2024   CHOL 95 03/04/2024   TRIG 43 03/04/2024   HDL 41 03/04/2024   LDLCALC 45 03/04/2024   ALT 33 03/07/2024   AST 55 (H) 03/07/2024   NA 130 (L) 03/07/2024   K 3.5 03/07/2024   CL 97 (L) 03/07/2024   CREATININE 1.69 (H) 03/07/2024   BUN 36 (H) 03/07/2024   CO2 23 03/07/2024   INR 1.0 03/03/2024   HGBA1C 5.6 03/03/2024      Assessment / Plan:      Cardiogenic shock dependent on inotropes/vasopressors/Impella CP s/p anterior STEMI with EF 25% by echo. I agree with conversion to Impella 5.5 via the right axillary artery to allow continued support while getting him up out of bed and mobilized. I discussed the operative procedure with the patient and family including alternatives, benefits and risks; including but not limited to bleeding, blood transfusion, infection, stroke, myocardial infarction, heart block requiring a permanent pacemaker, organ  dysfunction, and death.  Timothy Nicholson understands and agrees to proceed.  We will schedule surgery for tomorrow afternoon.  Dorise LOIS Fellers, MD   03/07/2024 2:35 PM

## 2024-03-07 NOTE — Plan of Care (Signed)
  Problem: Clinical Measurements: Goal: Ability to maintain clinical measurements within normal limits will improve Outcome: Progressing Goal: Will remain free from infection Outcome: Progressing Goal: Respiratory complications will improve Outcome: Progressing   Problem: Activity: Goal: Risk for activity intolerance will decrease Outcome: Progressing   Problem: Nutrition: Goal: Adequate nutrition will be maintained Outcome: Progressing   Problem: Coping: Goal: Level of anxiety will decrease Outcome: Progressing

## 2024-03-07 NOTE — Progress Notes (Addendum)
 Patient ID: Fountain Derusha Barge, male   DOB: 1950/11/15, 73 y.o.   MRN: [id number]     Advanced Heart Failure Rounding Note  Cardiologist: None  Chief Complaint: CHF  Patient Profile  73 y/o male admitted w/ acute anterior STEMI c/b CS requiring Impella CP support, further c/b + RPB/hemorrhagic shock.   Subjective:    Impella CP @ P7, Flow 3.3L. No flow alarms     On DBA 4, VP 0.03 and NE 13   Swan #s CVP 4-6  PA 45/19 CO/CI 5.85/2.97  SVR 957 Co-ox 59%   LVEDP on Impella 16-18   Scr improving 2.2 -> 2.0->1.69   Continues w/ hematuria   Hgb 7.5 Plts 66   Main compliant this morning is nausea. Denies CP, dyspnea, abdominal/groin/LBP.    Objective:   Weight Range: 88.3 kg Body mass index is 27.93 kg/m.   Vital Signs:   Temp:  [99 F (37.2 C)-100 F (37.8 C)] 99 F (37.2 C) (11/24 0700) Pulse Rate:  [76-99] 89 (11/24 0700) Resp:  [16-32] 23 (11/24 0700) BP: (107-142)/(68-100) 119/74 (11/24 0700) SpO2:  [89 %-97 %] 91 % (11/24 0700) Arterial Line BP: (85-120)/(48-62) 100/55 (11/24 0700) Weight:  [88.3 kg] 88.3 kg (11/24 0500) Last BM Date : 03/04/24  Weight change: Filed Weights   03/05/24 0500 03/06/24 0500 03/07/24 0500  Weight: 88.5 kg 90.4 kg 88.3 kg    Intake/Output:   Intake/Output Summary (Last 24 hours) at 03/07/2024 0719 Last data filed at 03/07/2024 0600 Gross per 24 hour  Intake 3512.97 ml  Output 1522 ml  Net 1990.97 ml      Physical Exam    CVP 5  GENERAL: fatigued appearing, NAD Lungs- clear  CARDIAC:  JVP not elevated         Normal rate with regular rhythm. No MRG. Trace b/l LEE   ABDOMEN: Soft, non-tender, non-distended.  EXTREMITIES: Warm and well perfused. Rt fem Impella site/groin stable  NEUROLOGIC: No obvious FND GU: + foley w/ gross hematuria   Telemetry   NSR 90s-low 100s Personally reviewed  Labs    CBC Recent Labs    03/06/24 1950 03/07/24 0420  WBC 8.9 8.1  HGB 8.0* 7.5*  HCT 23.2* 21.5*  MCV 91.3 91.1   PLT 70* 66*   Basic Metabolic Panel Recent Labs    88/76/74 0325 03/07/24 0420  NA 133* 130*  K 3.4* 3.5  CL 100 97*  CO2 23 23  GLUCOSE 117* 122*  BUN 40* 36*  CREATININE 2.00* 1.69*  CALCIUM  7.2* 7.3*  MG 2.1 2.1   Liver Function Tests Recent Labs    03/06/24 0325 03/07/24 0420  AST 112* 55*  ALT 43 33  ALKPHOS 26* 32*  BILITOT 1.1 1.0  PROT 5.4* 5.1*  ALBUMIN  3.0* 2.5*   No results for input(s): LIPASE, AMYLASE in the last 72 hours. Cardiac Enzymes No results for input(s): CKTOTAL, CKMB, CKMBINDEX, TROPONINI in the last 72 hours.  BNP: BNP (last 3 results) No results for input(s): BNP in the last 8760 hours.  ProBNP (last 3 results) No results for input(s): PROBNP in the last 8760 hours.   D-Dimer No results for input(s): DDIMER in the last 72 hours. Hemoglobin A1C No results for input(s): HGBA1C in the last 72 hours.  Fasting Lipid Panel No results for input(s): CHOL, HDL, LDLCALC, TRIG, CHOLHDL, LDLDIRECT in the last 72 hours.  Thyroid Function Tests No results for input(s): TSH, T4TOTAL, T3FREE, THYROIDAB in the last 72 hours.  Invalid input(s): FREET3  Other results:   Imaging    DG CHEST PORT 1 VIEW Result Date: 03/06/2024 CLINICAL DATA:  [id number] Hemoptysis 358247 EXAM: PORTABLE CHEST 1 VIEW COMPARISON:  March 05, 2024 FINDINGS: The cardiomediastinal silhouette is unchanged in contour.Similar positioning of an Impella device. RIGHT neck PA catheter tip terminates over the RIGHT inferior pulmonary artery. No pleural effusion. No pneumothorax. Similar appearance of patchy perihilar predominant opacities. IMPRESSION: Similar appearance of patchy perihilar predominant opacities. Electronically Signed   By: Corean Salter M.D.   On: 03/06/2024 15:23      Medications:     Scheduled Medications:  aspirin   81 mg Oral Daily   Chlorhexidine  Gluconate Cloth  6 each Topical Daily   feeding  supplement  237 mL Oral BID BM   finasteride   5 mg Oral Daily   insulin  aspart  0-15 Units Subcutaneous Q4H   insulin  glargine-yfgn  5 Units Subcutaneous Daily   mouth rinse  15 mL Mouth Rinse 4 times per day   pantoprazole   40 mg Oral Daily   polyethylene glycol  17 g Oral Daily   rosuvastatin   20 mg Oral Daily   sodium chloride  flush  3 mL Intravenous Q12H   tamsulosin   0.4 mg Oral Daily   ticagrelor   90 mg Oral BID    Infusions:  amiodarone  30 mg/hr (03/07/24 0600)   ampicillin -sulbactam (UNASYN ) IV Stopped (03/07/24 0501)   dexmedetomidine  (PRECEDEX ) IV infusion Stopped (03/06/24 1143)   DOBUTamine  4 mcg/kg/min (03/07/24 0600)   norepinephrine  (LEVOPHED ) Adult infusion 13 mcg/min (03/07/24 0629)   potassium chloride  10 mEq (03/07/24 0625)   promethazine  (PHENERGAN ) injection (IM or IVPB) Stopped (03/03/24 2233)   sodium bicarbonate  25 mEq (Impella PURGE) in dextrose  5 % 1000 mL bag     vasopressin  0.03 Units/min (03/07/24 0609)    PRN Medications: acetaminophen , HYDROmorphone  (DILAUDID ) injection, iohexol , melatonin, ondansetron  (ZOFRAN ) IV, mouth rinse, oxyCODONE , promethazine  (PHENERGAN ) injection (IM or IVPB), simethicone , sodium chloride  flush     Assessment/Plan   1. CAD: Anterior STEMI with occluded proximal LAD; the RCA was not selectively engaged but appeared to be relatively small with proximal occlusion. Now s/p DES to proximal LAD and PTCA to ostial D1.  - stable no s/s angina - continue ASA/statin/Brilinta  - management of post MI shock as below 2. Cardiogenic shock: Due to anterior MI.  Limited echo with Impella showed EF 25% by my read with LAD territory akinesis, normal RV size/mildly decreased systolic function.  - On DBA 4, NE 13 and VP 0.03. Co-ox 59%, CI 2.9, CVP 5  - Continue Impella at P7. Flows ok - No heparin  gtt, using HCO3 purge only for Impella.  - We have been unable to wean drips or Impella. Have discussed with Dr. Lucas plan for  Impella 5.5  tomorrow - continue to hold diuretics  - wean NE as tolerated, MAP Goal 70  3. AKI on CKD stage 3: Creatinine 1.6 => 2.1 -> 2.3 -> 2.0->1.69 with cardiogenic/hemorrhagic shock. Also had contrast loads with cath and CTA.   - Follow closely, support CO.   4. VT: Peri-STEMI. - VT quiescent  - Continue amiodarone  gtt 30 mg/hr for now  5. Heme: Hemorrhagic component to shock with hematoma at Impella access site and RP hematoma + hematuria.  CTA showed RP hematoma without active extravasation.  - No heparin  as above (HCO3 purge).  - hgb 7.5 today. Will give 1uRBCs - Transfuse hgb < 8. Follow CBC  6. Pulmonary: Possible aspiration  PNA.  - Continue Unasyn .  7. Hematuria - likely local trauma. Follow. LDH ok 8. Delirium - improved with Precedex  9. Thrombocytopenia - trending down, 66K today  - monitor closely   CRITICAL CARE Performed by: Caffie Shed   Total critical care time: 20 minutes  Critical care time was exclusive of separately billable procedures and treating other patients.  Critical care was necessary to treat or prevent imminent or life-threatening deterioration.  Critical care was time spent personally by me on the following activities: development of treatment plan with patient and/or surrogate as well as nursing, discussions with consultants, evaluation of patient's response to treatment, examination of patient, obtaining history from patient or surrogate, ordering and performing treatments and interventions, ordering and review of laboratory studies, ordering and review of radiographic studies, pulse oximetry and re-evaluation of patient's condition.      Length of Stay: [address] PA-C  03/07/2024, 7:19 AM  Advanced Heart Failure Team Pager [phone] (M-F; 7a - 5p)  Please contact [facility] for night-coverage after hours (5p -7a ) and weekends on [email or web address]    Agree with above  Remains on Impella at P-7 with 3.3 L flow. Parameters stable.  Also on NE 13, VP 0.03 and DBA 4. Co-ox and hemodynamics ok. Unable to wean NE or other agents.   Continue with hematuria.   Norva numbers reviewed personally. Hgb 7.5 PLTs trending down.  General:  Lying in bed. No resp difficulty HEENT: normal Neck: supple. RIJ swan Cor: Regular rate & rhythm. No rubs, gallops or murmurs. Lungs: clear Abdomen: soft, nontender, nondistended.Good bowel sounds. Extremities: no cyanosis, clubbing, rash, edema + RFA impella site ok  Neuro: alert & orientedx3, cranial nerves grossly intact. moves all 4 extremities w/o difficulty. Affect pleasant  Continues to require significant hemodynamic support. Unable to wean impella or drips. Will plan Impella 5.5 tomorrow to give extended time for recover vs bridge to durable LVAD.   CRITICAL CARE Performed by: Cherrie Sieving  Total critical care time: 45 minutes  Critical care time was exclusive of separately billable procedures and treating other patients.  Critical care was necessary to treat or prevent imminent or life-threatening deterioration.  Critical care was time spent personally by me (independent of midlevel providers or residents) on the following activities: development of treatment plan with patient and/or surrogate as well as nursing, discussions with consultants, evaluation of patient's response to treatment, examination of patient, obtaining history from patient or surrogate, ordering and performing treatments and interventions, ordering and review of laboratory studies, ordering and review of radiographic studies, pulse oximetry and re-evaluation of patient's condition.  Sieving Cherrie, MD  1:13 PM

## 2024-03-07 NOTE — Progress Notes (Signed)
 Pt having dark red bloody urine with clots noted in foley bag. Called to room to insert Coude catheter with 3 way capabilities for CBI. Urojet into urethra prior to insertion to make more comfortable for pt. Inserted 22 F /30 cc balloon Coude ribbed catheter with bloody urine returned. Hooked up CBI (NS) and drainage bag and leg strap.  Educated pt and family on the CBI.

## 2024-03-07 NOTE — Progress Notes (Signed)
 NAME:  Shivaan Tierno Engelstad, MRN:  [id number], DOB:  Apr 15, 1950, LOS: 4 ADMISSION DATE:  03/03/2024, CONSULTATION DATE: 03/03/2024 REFERRING MD: Dr. Ezra, CHIEF COMPLAINT: Chest pain  History of Present Illness:  73 year old male with hypertension, hyperlipidemia, BPH, CKD 3a and prediabetes who presented as a code STEMI, after he developed chest pain while working at home, he was emergently taken to Cath Lab, underwent LHC.  During procedure he was hypotensive requiring Levophed  infusion, vomited and aspirated requiring nonrebreather facemask.  His LVEDP was 30 with wedge pressure of 24, CPAP was placed after that he went into brief VT/VF and was shocked.  Patient was noted to have occluded proximal LAD, underwent PCI and DES. Post procedure complicated by cardiogenic shock with Impella CP placement  Pertinent  Medical History   Past Medical History:  Diagnosis Date   Arthritis    osteo   Back pain    Benign prostatic hyperplasia with urinary obstruction and other lower urinary tract symptoms    Cancer (HCC)    maligant polyp   Dyslipidemia    Dysplastic nevus 08/13/2006   L chest pectoral - mod, excision 06.10.2008   Dysplastic nevus 09/29/2006   L groin - mild   Elevated PSA    Enlarged prostate    Family history of breast cancer    Family history of colon cancer    Family history of lung cancer    Family history of pancreatic cancer    Family history of prostate cancer    GERD (gastroesophageal reflux disease)    Headache    migraine without aura   HOH (hard of hearing)    Hypercholesterolemia    Hypertension    Personal history of colon cancer 01/19/2018   Scoliosis      Significant Hospital Events: Including procedures, antibiotic start and stop dates in addition to other pertinent events   11/20: STEMI with PCI to LAD, Impella CP placement; RP bleed noted   Interim History / Subjective:  Ongoing pressor need  amiodarone  30 mg/hr (03/07/24 0600)    ampicillin -sulbactam (UNASYN ) IV Stopped (03/07/24 0501)   dexmedetomidine  (PRECEDEX ) IV infusion Stopped (03/06/24 1143)   DOBUTamine  4 mcg/kg/min (03/07/24 0600)   norepinephrine  (LEVOPHED ) Adult infusion 13 mcg/min (03/07/24 0629)   potassium chloride  10 mEq (03/07/24 0625)   promethazine  (PHENERGAN ) injection (IM or IVPB) Stopped (03/03/24 2233)   sodium bicarbonate  25 mEq (Impella PURGE) in dextrose  5 % 1000 mL bag     vasopressin  0.03 Units/min (03/07/24 0609)     Objective    Blood pressure 119/74, pulse 89, temperature 99 F (37.2 C), resp. rate (!) 23, height 5' 10 (1.778 m), weight 88.3 kg, SpO2 91%. PAP: (32-54)/(10-30) 34/13 CVP:  [3 mmHg-20 mmHg] 6 mmHg CO:  [5.7 L/min-6.5 L/min] 5.9 L/min CI:  [2.9 L/min/m2-3.3 L/min/m2] 3 L/min/m2      Intake/Output Summary (Last 24 hours) at 03/07/2024 9286 Last data filed at 03/07/2024 0600 Gross per 24 hour  Intake 3512.97 ml  Output 1522 ml  Net 1990.97 ml   Filed Weights   03/05/24 0500 03/06/24 0500 03/07/24 0500  Weight: 88.5 kg 90.4 kg 88.3 kg    Examination: No distress laying in bed Stable bruising around impella site Hematuria in foley bag Abd soft, hypoactive BS Ext warm, minimal edema Aox3  Labs/imaging reviewed  Resolved problem list   Assessment and Plan  Acute anterior STEMI status post PCI with DES to LAD Acute HFrEF (35%) with cardiogenic shock requiring CP Impella  VT/V-fib  Acute respiratory failure with hypoxia Aspiration pneumonia vs pneumonitis Ongoing nausea and vomiting Acute kidney injury with baseline CKD stage IIIa- improved Elevated LFTs due to shock liver; improved Hyperglycemia with prediabetes, A1c 5.6, stable on basal bolus ABLA- 2/2 RP bleed and hematuria, latter started yesterday Right RP hematoma- noted after impella placement Thrombocytopenia- secondary to hemolysis and ABLA, slowly dropping 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 - Incidental finding, outpatient follow  up needed ICU delirium  - Check KUB, nausea meds as ordered, added simethicone  and reglan ; Bieri need to consider cortrak - Unasyn , 5 days stop date in - Continue DAPT for now, usual transfusion thresholds - Pressors for MAP 65 - Diuretics, impella (bicarb purge) per AHF - Reorient PRN, encourage day/night cycles and family visitation, minimize nightly disruptions - Not candidate for 5-5 until bleeding issues settle out; will see how next couple days go, guarded prognosis  33 minutes CC time Rolan Sharps MD PCCM

## 2024-03-08 ENCOUNTER — Inpatient Hospital Stay (HOSPITAL_COMMUNITY)

## 2024-03-08 ENCOUNTER — Encounter (HOSPITAL_COMMUNITY): Admission: EM | Disposition: A | Payer: Self-pay | Source: Home / Self Care | Attending: Cardiology

## 2024-03-08 ENCOUNTER — Encounter (HOSPITAL_COMMUNITY): Payer: Self-pay | Admitting: Anesthesiology

## 2024-03-08 ENCOUNTER — Inpatient Hospital Stay (HOSPITAL_COMMUNITY): Payer: Self-pay | Admitting: Anesthesiology

## 2024-03-08 DIAGNOSIS — I2102 ST elevation (STEMI) myocardial infarction involving left anterior descending coronary artery: Secondary | ICD-10-CM | POA: Diagnosis not present

## 2024-03-08 DIAGNOSIS — R570 Cardiogenic shock: Secondary | ICD-10-CM

## 2024-03-08 DIAGNOSIS — I5021 Acute systolic (congestive) heart failure: Secondary | ICD-10-CM | POA: Diagnosis not present

## 2024-03-08 DIAGNOSIS — J9601 Acute respiratory failure with hypoxia: Secondary | ICD-10-CM | POA: Diagnosis not present

## 2024-03-08 HISTORY — PX: PLACEMENT OF IMPELLA LEFT VENTRICULAR ASSIST DEVICE: SHX6519

## 2024-03-08 HISTORY — PX: INTRAOPERATIVE TRANSESOPHAGEAL ECHOCARDIOGRAM: SHX5062

## 2024-03-08 HISTORY — PX: CYSTOSCOPY WITH RETROGRADE PYELOGRAM, URETEROSCOPY AND STENT PLACEMENT: SHX5789

## 2024-03-08 LAB — POCT I-STAT, CHEM 8
BUN: 52 mg/dL — ABNORMAL HIGH (ref 8–23)
BUN: 38 mg/dL — ABNORMAL HIGH (ref 8–23)
Calcium, Ion: 1.12 mmol/L — ABNORMAL LOW (ref 1.15–1.40)
Calcium, Ion: 1.15 mmol/L (ref 1.15–1.40)
Chloride: 98 mmol/L (ref 98–111)
Chloride: 98 mmol/L (ref 98–111)
Creatinine, Ser: 1.6 mg/dL — ABNORMAL HIGH (ref 0.61–1.24)
Creatinine, Ser: 1.5 mg/dL — ABNORMAL HIGH (ref 0.61–1.24)
Glucose, Bld: 106 mg/dL — ABNORMAL HIGH (ref 70–99)
Glucose, Bld: 114 mg/dL — ABNORMAL HIGH (ref 70–99)
HCT: 26 % — ABNORMAL LOW (ref 39.0–52.0)
HCT: 26 % — ABNORMAL LOW (ref 39.0–52.0)
Hemoglobin: 8.8 g/dL — ABNORMAL LOW (ref 13.0–17.0)
Hemoglobin: 8.8 g/dL — ABNORMAL LOW (ref 13.0–17.0)
Potassium: 4.5 mmol/L (ref 3.5–5.1)
Potassium: 4 mmol/L (ref 3.5–5.1)
Sodium: 131 mmol/L — ABNORMAL LOW (ref 135–145)
Sodium: 130 mmol/L — ABNORMAL LOW (ref 135–145)
TCO2: 23 mmol/L (ref 22–32)
TCO2: 23 mmol/L (ref 22–32)

## 2024-03-08 LAB — POCT I-STAT 7, (LYTES, BLD GAS, ICA,H+H)
Acid-Base Excess: 0 mmol/L (ref 0.0–2.0)
Acid-base deficit: 1 mmol/L (ref 0.0–2.0)
Acid-base deficit: 1 mmol/L (ref 0.0–2.0)
Bicarbonate: 23.4 mmol/L (ref 20.0–28.0)
Bicarbonate: 24.2 mmol/L (ref 20.0–28.0)
Bicarbonate: 25.3 mmol/L (ref 20.0–28.0)
Calcium, Ion: 1.11 mmol/L — ABNORMAL LOW (ref 1.15–1.40)
Calcium, Ion: 1.08 mmol/L — ABNORMAL LOW (ref 1.15–1.40)
Calcium, Ion: 1.06 mmol/L — ABNORMAL LOW (ref 1.15–1.40)
HCT: 28 % — ABNORMAL LOW (ref 39.0–52.0)
HCT: 25 % — ABNORMAL LOW (ref 39.0–52.0)
HCT: 29 % — ABNORMAL LOW (ref 39.0–52.0)
Hemoglobin: 9.5 g/dL — ABNORMAL LOW (ref 13.0–17.0)
Hemoglobin: 8.5 g/dL — ABNORMAL LOW (ref 13.0–17.0)
Hemoglobin: 9.9 g/dL — ABNORMAL LOW (ref 13.0–17.0)
O2 Saturation: 92 %
O2 Saturation: 100 %
O2 Saturation: 98 %
Patient temperature: 35.7
Potassium: 4.5 mmol/L (ref 3.5–5.1)
Potassium: 3.9 mmol/L (ref 3.5–5.1)
Potassium: 4.8 mmol/L (ref 3.5–5.1)
Sodium: 130 mmol/L — ABNORMAL LOW (ref 135–145)
Sodium: 131 mmol/L — ABNORMAL LOW (ref 135–145)
Sodium: 130 mmol/L — ABNORMAL LOW (ref 135–145)
TCO2: 25 mmol/L (ref 22–32)
TCO2: 25 mmol/L (ref 22–32)
TCO2: 26 mmol/L (ref 22–32)
pCO2 arterial: 37.3 mmHg (ref 32–48)
pCO2 arterial: 40.4 mmHg (ref 32–48)
pCO2 arterial: 38.7 mmHg (ref 32–48)
pH, Arterial: 7.406 (ref 7.35–7.45)
pH, Arterial: 7.385 (ref 7.35–7.45)
pH, Arterial: 7.418 (ref 7.35–7.45)
pO2, Arterial: 63 mmHg — ABNORMAL LOW (ref 83–108)
pO2, Arterial: 274 mmHg — ABNORMAL HIGH (ref 83–108)
pO2, Arterial: 93 mmHg (ref 83–108)

## 2024-03-08 LAB — PROTIME-INR
INR: 1.1 (ref 0.8–1.2)
Prothrombin Time: 14.9 s (ref 11.4–15.2)

## 2024-03-08 LAB — COOXEMETRY PANEL
Carboxyhemoglobin: 1.8 % — ABNORMAL HIGH (ref 0.5–1.5)
Methemoglobin: <0.7 % (ref 0.0–1.5)
O2 Saturation: 59.7 %
Total hemoglobin: 8.6 g/dL — ABNORMAL LOW (ref 12.0–16.0)

## 2024-03-08 LAB — BASIC METABOLIC PANEL WITH GFR
Anion gap: 10 (ref 5–15)
BUN: 39 mg/dL — ABNORMAL HIGH (ref 8–23)
CO2: 22 mmol/L (ref 22–32)
Calcium: 7.4 mg/dL — ABNORMAL LOW (ref 8.9–10.3)
Chloride: 99 mmol/L (ref 98–111)
Creatinine, Ser: 1.61 mg/dL — ABNORMAL HIGH (ref 0.61–1.24)
GFR, Estimated: 45 mL/min — ABNORMAL LOW (ref >60)
Glucose, Bld: 152 mg/dL — ABNORMAL HIGH (ref 70–99)
Potassium: 3.6 mmol/L (ref 3.5–5.1)
Sodium: 131 mmol/L — ABNORMAL LOW (ref 135–145)

## 2024-03-08 LAB — CBC
HCT: 21.2 % — ABNORMAL LOW (ref 39.0–52.0)
HCT: 30.4 % — ABNORMAL LOW (ref 39.0–52.0)
Hemoglobin: 7.4 g/dL — ABNORMAL LOW (ref 13.0–17.0)
Hemoglobin: 10.6 g/dL — ABNORMAL LOW (ref 13.0–17.0)
MCH: 31.2 pg (ref 26.0–34.0)
MCH: 30.8 pg (ref 26.0–34.0)
MCHC: 34.9 g/dL (ref 30.0–36.0)
MCHC: 34.9 g/dL (ref 30.0–36.0)
MCV: 89.5 fL (ref 80.0–100.0)
MCV: 88.4 fL (ref 80.0–100.0)
Platelets: 63 K/uL — ABNORMAL LOW (ref 150–400)
Platelets: 86 K/uL — ABNORMAL LOW (ref 150–400)
RBC: 2.37 MIL/uL — ABNORMAL LOW (ref 4.22–5.81)
RBC: 3.44 MIL/uL — ABNORMAL LOW (ref 4.22–5.81)
RDW: 15 % (ref 11.5–15.5)
RDW: 15.2 % (ref 11.5–15.5)
WBC: 7.7 K/uL (ref 4.0–10.5)
WBC: 9.4 K/uL (ref 4.0–10.5)
nRBC: 0 % (ref 0.0–0.2)
nRBC: 0 % (ref 0.0–0.2)

## 2024-03-08 LAB — PREPARE RBC (CROSSMATCH)

## 2024-03-08 LAB — GLUCOSE, CAPILLARY
Glucose-Capillary: 117 mg/dL — ABNORMAL HIGH (ref 70–99)
Glucose-Capillary: 127 mg/dL — ABNORMAL HIGH (ref 70–99)
Glucose-Capillary: 143 mg/dL — ABNORMAL HIGH (ref 70–99)
Glucose-Capillary: 147 mg/dL — ABNORMAL HIGH (ref 70–99)
Glucose-Capillary: 150 mg/dL — ABNORMAL HIGH (ref 70–99)

## 2024-03-08 LAB — MAGNESIUM: Magnesium: 2 mg/dL (ref 1.7–2.4)

## 2024-03-08 LAB — HEMOGLOBIN AND HEMATOCRIT, BLOOD
HCT: 30.8 % — ABNORMAL LOW (ref 39.0–52.0)
Hemoglobin: 10.5 g/dL — ABNORMAL LOW (ref 13.0–17.0)

## 2024-03-08 LAB — LACTATE DEHYDROGENASE: LDH: 611 U/L — ABNORMAL HIGH (ref 105–235)

## 2024-03-08 SURGERY — INSERTION, CARDIAC ASSIST DEVICE, IMPELLA
Anesthesia: General

## 2024-03-08 MED ORDER — ROCURONIUM BROMIDE 10 MG/ML (PF) SYRINGE
PREFILLED_SYRINGE | INTRAVENOUS | Status: AC
  Filled 2024-03-08: qty 10

## 2024-03-08 MED ORDER — FENTANYL CITRATE (PF) 100 MCG/2ML IJ SOLN
INTRAMUSCULAR | Status: AC
  Filled 2024-03-08: qty 2

## 2024-03-08 MED ORDER — ROCURONIUM BROMIDE 10 MG/ML (PF) SYRINGE
PREFILLED_SYRINGE | INTRAVENOUS | Status: DC | PRN
  Administered 2024-03-08: 100 mg via INTRAVENOUS
  Administered 2024-03-08 (×2): 50 mg via INTRAVENOUS

## 2024-03-08 MED ORDER — SODIUM CHLORIDE 0.9 % IV SOLN: INTRAVENOUS | Status: DC | PRN

## 2024-03-08 MED ORDER — CALCIUM GLUCONATE-NACL 2-0.675 GM/100ML-% IV SOLN
2.0000 g | Freq: Once | INTRAVENOUS | Status: AC
  Administered 2024-03-08: 2000 mg via INTRAVENOUS
  Filled 2024-03-08: qty 100

## 2024-03-08 MED ORDER — PROPOFOL 10 MG/ML IV BOLUS
INTRAVENOUS | Status: DC | PRN
  Administered 2024-03-08: 50 mg via INTRAVENOUS
  Administered 2024-03-08 (×2): 25 mg via INTRAVENOUS

## 2024-03-08 MED ORDER — POLYETHYLENE GLYCOL 3350 17 G PO PACK: 17.0000 g | PACK | Freq: Every day | ORAL | Status: DC

## 2024-03-08 MED ORDER — LIDOCAINE HCL URETHRAL/MUCOSAL 2 % EX GEL
1.0000 | Freq: Once | CUTANEOUS | Status: AC
  Administered 2024-03-08: 1 via URETHRAL
  Filled 2024-03-08: qty 6

## 2024-03-08 MED ORDER — PROPOFOL 10 MG/ML IV BOLUS
INTRAVENOUS | Status: AC
  Filled 2024-03-08: qty 20

## 2024-03-08 MED ORDER — INSULIN GLARGINE-YFGN 100 UNIT/ML ~~LOC~~ SOLN
5.0000 [IU] | Freq: Every day | SUBCUTANEOUS | Status: DC
  Filled 2024-03-08: qty 0.05

## 2024-03-08 MED ORDER — TRAZODONE HCL 50 MG PO TABS
50.0000 mg | ORAL_TABLET | Freq: Every day | ORAL | Status: DC
  Administered 2024-03-08 – 2024-03-16 (×9): 50 mg via ORAL
  Filled 2024-03-08 (×9): qty 1

## 2024-03-08 MED ORDER — DEXMEDETOMIDINE HCL IN NACL 400 MCG/100ML IV SOLN
0.0000 ug/kg/h | INTRAVENOUS | Status: DC
  Administered 2024-03-08 – 2024-03-09 (×2): 0.5 ug/kg/h via INTRAVENOUS
  Filled 2024-03-08: qty 100

## 2024-03-08 MED ORDER — HEPARIN SODIUM (PORCINE) 1000 UNIT/ML IJ SOLN
INTRAMUSCULAR | Status: AC
Start: 2024-03-08 — End: 2024-03-08
  Filled 2024-03-08: qty 10

## 2024-03-08 MED ORDER — DEXMEDETOMIDINE HCL IN NACL 200 MCG/50ML IV SOLN
INTRAVENOUS | Status: DC | PRN
  Administered 2024-03-08: .5 ug/kg/h via INTRAVENOUS

## 2024-03-08 MED ORDER — HEPARIN 6000 UNIT IRRIGATION SOLUTION
Status: AC
  Filled 2024-03-08: qty 500

## 2024-03-08 MED ORDER — LIDOCAINE 2% (20 MG/ML) 5 ML SYRINGE
INTRAMUSCULAR | Status: AC
  Filled 2024-03-08: qty 5

## 2024-03-08 MED ORDER — LACTATED RINGERS IV SOLN: INTRAVENOUS | Status: DC | PRN

## 2024-03-08 MED ORDER — PROTAMINE SULFATE 10 MG/ML IV SOLN
INTRAVENOUS | Status: DC | PRN
  Administered 2024-03-08: 50 mg via INTRAVENOUS

## 2024-03-08 MED ORDER — SODIUM CHLORIDE 0.9 % IV SOLN: 10.0000 mL/h | Freq: Once | INTRAVENOUS | Status: DC

## 2024-03-08 MED ORDER — ORAL CARE MOUTH RINSE: 15.0000 mL | OROMUCOSAL | Status: DC | PRN

## 2024-03-08 MED ORDER — HEMOSTATIC AGENTS (NO CHARGE) OPTIME
TOPICAL | Status: DC | PRN
  Administered 2024-03-08: 1 via TOPICAL

## 2024-03-08 MED ORDER — SODIUM CHLORIDE 0.9% IV SOLUTION: Freq: Once | INTRAVENOUS | Status: DC

## 2024-03-08 MED ORDER — HEPARIN 6000 UNIT IRRIGATION SOLUTION
Status: DC | PRN
  Administered 2024-03-08: 1

## 2024-03-08 MED ORDER — ONDANSETRON HCL 4 MG/2ML IJ SOLN
INTRAMUSCULAR | Status: AC
  Filled 2024-03-08: qty 2

## 2024-03-08 MED ORDER — FUROSEMIDE 10 MG/ML IJ SOLN
40.0000 mg | Freq: Once | INTRAMUSCULAR | Status: AC
  Administered 2024-03-08: 40 mg via INTRAVENOUS
  Filled 2024-03-08: qty 4

## 2024-03-08 MED ORDER — MIDAZOLAM HCL (PF) 2 MG/2ML IJ SOLN: 1.0000 mg | INTRAMUSCULAR | Status: DC | PRN

## 2024-03-08 MED ORDER — POTASSIUM CHLORIDE CRYS ER 20 MEQ PO TBCR
40.0000 meq | EXTENDED_RELEASE_TABLET | Freq: Once | ORAL | Status: AC
  Administered 2024-03-08: 40 meq via ORAL
  Filled 2024-03-08: qty 2

## 2024-03-08 MED ORDER — SODIUM CHLORIDE 0.9 % IR SOLN
Status: DC | PRN
  Administered 2024-03-08: 18000 mL via INTRAVESICAL

## 2024-03-08 MED ORDER — FINASTERIDE 5 MG PO TABS: 5.0000 mg | ORAL_TABLET | Freq: Every day | ORAL | Status: DC

## 2024-03-08 MED ORDER — FENTANYL CITRATE (PF) 50 MCG/ML IJ SOSY: 25.0000 ug | PREFILLED_SYRINGE | Freq: Once | INTRAMUSCULAR | Status: DC

## 2024-03-08 MED ORDER — DOCUSATE SODIUM 50 MG/5ML PO LIQD
100.0000 mg | Freq: Two times a day (BID) | ORAL | Status: DC
  Administered 2024-03-08: 100 mg
  Filled 2024-03-08: qty 10

## 2024-03-08 MED ORDER — FENTANYL 2500MCG IN NS 250ML (10MCG/ML) PREMIX INFUSION
0.0000 ug/h | INTRAVENOUS | Status: DC
  Administered 2024-03-08: 25 ug/h via INTRAVENOUS
  Filled 2024-03-08: qty 250

## 2024-03-08 MED ORDER — HEPARIN SODIUM (PORCINE) 1000 UNIT/ML IJ SOLN
INTRAMUSCULAR | Status: DC | PRN
  Administered 2024-03-08: 10000 [IU] via INTRAVENOUS

## 2024-03-08 MED ORDER — FENTANYL CITRATE (PF) 100 MCG/2ML IJ SOLN
INTRAMUSCULAR | Status: DC | PRN
  Administered 2024-03-08: 100 ug via INTRAVENOUS
  Administered 2024-03-08 (×2): 50 ug via INTRAVENOUS
  Administered 2024-03-08: 100 ug via INTRAVENOUS

## 2024-03-08 MED ORDER — FENTANYL BOLUS VIA INFUSION
25.0000 ug | INTRAVENOUS | Status: DC | PRN
  Administered 2024-03-09 (×2): 100 ug via INTRAVENOUS

## 2024-03-08 MED ORDER — LIDOCAINE 2% (20 MG/ML) 5 ML SYRINGE
INTRAMUSCULAR | Status: DC | PRN
  Administered 2024-03-08: 100 mg via INTRAVENOUS

## 2024-03-08 MED ORDER — ORAL CARE MOUTH RINSE
15.0000 mL | OROMUCOSAL | Status: DC
  Administered 2024-03-08 – 2024-03-09 (×7): 15 mL via OROMUCOSAL

## 2024-03-08 MED ORDER — QUETIAPINE FUMARATE 25 MG PO TABS
25.0000 mg | ORAL_TABLET | Freq: Every day | ORAL | Status: DC
  Administered 2024-03-08 – 2024-03-15 (×8): 25 mg via ORAL
  Filled 2024-03-08 (×8): qty 1

## 2024-03-08 SURGICAL SUPPLY — 55 items
APPLICATOR TIP COSEAL (VASCULAR PRODUCTS) IMPLANT
BAG URINE DRAIN 2000ML AR STRL (UROLOGICAL SUPPLIES) IMPLANT
BLADE CLIPPER SURG (BLADE) ×2 IMPLANT
CATH HEMA 3WAY 30CC 22FR COUDE (CATHETERS) IMPLANT
CATH INFINITI 5FR ANG PIGTAIL (CATHETERS) IMPLANT
CATH ROBINSON RED A/P 14FR (CATHETERS) IMPLANT
CLIP TI WIDE RED SMALL 24 (CLIP) IMPLANT
CONTAINER PROTECT SURGISLUSH (MISCELLANEOUS) ×2 IMPLANT
DRAIN CHANNEL 15F RND FF W/TCR (WOUND CARE) IMPLANT
DRAPE C-ARM 42X72 X-RAY (DRAPES) ×4 IMPLANT
DRAPE CV SPLIT W-CLR ANES SCRN (DRAPES) ×2 IMPLANT
DRAPE INCISE IOBAN 66X45 STRL (DRAPES) IMPLANT
DRAPE PERI GROIN 82X75IN TIB (DRAPES) ×2 IMPLANT
DRAPE WARM FLUID 44X44 (DRAPES) ×2 IMPLANT
DRILL FLIPCUTTER III 6-12 (ORTHOPEDIC DISPOSABLE SUPPLIES) IMPLANT
DRSG TEGADERM 4X4.5 CHG (GAUZE/BANDAGES/DRESSINGS) IMPLANT
EVACUATOR SILICONE 100CC (DRAIN) IMPLANT
FELT TEFLON 1X6 (MISCELLANEOUS) IMPLANT
GAUZE 4X4 16PLY ~~LOC~~+RFID DBL (SPONGE) ×2 IMPLANT
GAUZE SPONGE 4X4 12PLY STRL (GAUZE/BANDAGES/DRESSINGS) IMPLANT
GLOVE BIOGEL M STRL SZ7.5 (GLOVE) ×6 IMPLANT
GOWN STRL REUS W/ TWL LRG LVL3 (GOWN DISPOSABLE) ×8 IMPLANT
GRAFT VASC STRG 30X10STRL (Graft) IMPLANT
HEMOSTAT SURGICEL 2X14 (HEMOSTASIS) IMPLANT
INSERT FOGARTY SM (MISCELLANEOUS) ×2 IMPLANT
KIT BASIN OR (CUSTOM PROCEDURE TRAY) ×2 IMPLANT
LOOP CUT BIPOLAR 24F LRG (ELECTROSURGICAL) IMPLANT
LOOP VASCLR MAXI BLUE 18IN ST (MISCELLANEOUS) ×2 IMPLANT
PACK CHEST (CUSTOM PROCEDURE TRAY) ×2 IMPLANT
PAD ARMBOARD POSITIONER FOAM (MISCELLANEOUS) ×4 IMPLANT
PAD ELECT DEFIB RADIOL ZOLL (MISCELLANEOUS) ×2 IMPLANT
PENCIL BUTTON HOLSTER BLD 10FT (ELECTRODE) IMPLANT
PUMP SET IMPELLA 5.5 US (CATHETERS) ×2 IMPLANT
SEALANT HEMOST PREVELEAK 4ML (HEMOSTASIS) IMPLANT
SEALANT SURG COSEAL 8ML (VASCULAR PRODUCTS) ×2 IMPLANT
SOL .9 NS 3000ML IRR UROMATIC (IV SOLUTION) IMPLANT
SOLN 0.9% NACL POUR BTL 1000ML (IV SOLUTION) ×8 IMPLANT
SOLN STERILE WATER BTL 1000 ML (IV SOLUTION) ×4 IMPLANT
SPONGE T-LAP 18X18 ~~LOC~~+RFID (SPONGE) ×8 IMPLANT
SPONGE T-LAP 4X18 ~~LOC~~+RFID (SPONGE) ×2 IMPLANT
SUT ETHILON 3 0 PS 1 (SUTURE) ×4 IMPLANT
SUT PROLENE 5 0 C1 (SUTURE) ×4 IMPLANT
SUT SILK 1 MH (SUTURE) ×8 IMPLANT
SUT SILK 1 TIES 10X30 (SUTURE) ×2 IMPLANT
SUT SILK 2 0 SH CR/8 (SUTURE) IMPLANT
SUT VIC AB 2-0 CT1 18 (SUTURE) ×4 IMPLANT
SUT VIC AB 2-0 CT1 TAPERPNT 27 (SUTURE) IMPLANT
SUT VIC AB 3-0 X1 27 (SUTURE) ×2 IMPLANT
SYR 20ML LL LF (SYRINGE) IMPLANT
SYSTEM COMPRESSION FEMOSTOP (HEMOSTASIS) IMPLANT
TOWEL GREEN STERILE (TOWEL DISPOSABLE) ×2 IMPLANT
TOWEL GREEN STERILE FF (TOWEL DISPOSABLE) ×2 IMPLANT
TUBE CONNECTING 12X1/4 (SUCTIONS) IMPLANT
VASCULAR TIE MINI RED 18IN STL (MISCELLANEOUS) ×2 IMPLANT
WIRE EMERALD 3MM-J .035X260CM (WIRE) IMPLANT

## 2024-03-08 NOTE — Consult Note (Signed)
 H&P Physician requesting consult: Toribio Sharps, MD  Chief Complaint: Gross hematuria  History of Present Illness: 73 year old male with hypertension, hyperlipidemia, BPH currently admitted with STEMI and emergently taken to the cardiac Cath Lab and stent was placed.  On aspirin  and Brilinta .  Developed gross hematuria that has apparently been ongoing for few days.  CBI was started by critical care per report but continues to have gross hematuria and acute blood loss anemia.  I was consulted today and upon arriving at the hospital patient was actually in the operating room for Impella insertion.  I evaluated him and he had significant gross hematuria despite manual irrigation and I felt it was most appropriate to proceed with cystoscopy with possible interventions.  He did undergo a CT angiogram of the abdomen that revealed a heterogeneous lesion containing macroscopic fat in the right kidney measuring 2.8 cm in the superior pole the right kidney there was a right retroperitoneal hematoma extending from the inferior pole of the right kidney into the right hemipelvis with no areas of active arterial extravasation.  The scan was performed on 03/03/2024.  Patient's wife reported that he has had some issues with urinary complaints and enlarged prostate.  Past Medical History:  Diagnosis Date   Arthritis    osteo   Back pain    Benign prostatic hyperplasia with urinary obstruction and other lower urinary tract symptoms    Cancer (HCC)    maligant polyp   Dyslipidemia    Dysplastic nevus 08/13/2006   L chest pectoral - mod, excision 06.10.2008   Dysplastic nevus 09/29/2006   L groin - mild   Elevated PSA    Enlarged prostate    Family history of breast cancer    Family history of colon cancer    Family history of lung cancer    Family history of pancreatic cancer    Family history of prostate cancer    GERD (gastroesophageal reflux disease)    Headache    migraine without aura   HOH (hard  of hearing)    Hypercholesterolemia    Hypertension    Personal history of colon cancer 01/19/2018   Scoliosis    Past Surgical History:  Procedure Laterality Date   COLONOSCOPY  08/27/2010   Dr. Gaylyn Tubular adenomas, FH polyps   COLONOSCOPY     COLONOSCOPY WITH PROPOFOL  N/A 11/19/2017   Procedure: COLONOSCOPY WITH PROPOFOL ;  Surgeon: Gaylyn Gladis PENNER, MD;  Location: [facility] ENDOSCOPY;  Service: Endoscopy;  Laterality: N/A;   COLONOSCOPY WITH PROPOFOL  N/A 04/29/2018   Procedure: COLONOSCOPY WITH PROPOFOL ;  Surgeon: Gaylyn Gladis PENNER, MD;  Location: [facility] ENDOSCOPY;  Service: Endoscopy;  Laterality: N/A;   COLONOSCOPY WITH PROPOFOL  N/A 04/30/2018   Procedure: COLONOSCOPY WITH PROPOFOL ;  Surgeon: Gaylyn Gladis PENNER, MD;  Location: [facility] ENDOSCOPY;  Service: Endoscopy;  Laterality: N/A;   COLONOSCOPY WITH PROPOFOL  N/A 05/31/2018   Procedure: COLONOSCOPY WITH PROPOFOL ;  Surgeon: Gaylyn Gladis PENNER, MD;  Location: [facility] ENDOSCOPY;  Service: Endoscopy;  Laterality: N/A;   COLONOSCOPY WITH PROPOFOL  N/A 09/17/2018   Procedure: COLONOSCOPY WITH PROPOFOL ;  Surgeon: Gaylyn Gladis PENNER, MD;  Location: [facility] ENDOSCOPY;  Service: Endoscopy;  Laterality: N/A;   COLONOSCOPY WITH PROPOFOL  N/A 06/25/2020   Procedure: COLONOSCOPY WITH PROPOFOL ;  Surgeon: Maryruth Ole DASEN, MD;  Location: [facility] ENDOSCOPY;  Service: Endoscopy;  Laterality: N/A;   CORONARY/GRAFT ACUTE MI REVASCULARIZATION N/A 03/03/2024   Procedure: Coronary/Graft Acute MI Revascularization;  Surgeon: Wonda Sharper, MD;  Location: [facility] INVASIVE CV LAB;  Service:  Cardiovascular;  Laterality: N/A;   ESOPHAGOGASTRODUODENOSCOPY (EGD) WITH PROPOFOL  N/A 11/19/2017   Procedure: ESOPHAGOGASTRODUODENOSCOPY (EGD) WITH PROPOFOL ;  Surgeon: Gaylyn Gladis PENNER, MD;  Location: [facility] ENDOSCOPY;  Service: Endoscopy;  Laterality: N/A;   GAS INSERTION Left 09/20/2019   Procedure: INSERTION OF GAS;  Surgeon: Alvia Norleen BIRCH, MD;  Location: [facility] OR;  Service: Ophthalmology;   Laterality: Left;   LASER PHOTO ABLATION Left 09/20/2019   Procedure: Laser Photo Ablation;  Surgeon: Alvia Norleen BIRCH, MD;  Location: [facility] OR;  Service: Ophthalmology;  Laterality: Left;   LEFT HEART CATH AND CORONARY ANGIOGRAPHY N/A 03/03/2024   Procedure: LEFT HEART CATH AND CORONARY ANGIOGRAPHY;  Surgeon: Wonda Sharper, MD;  Location: [facility] INVASIVE CV LAB;  Service: Cardiovascular;  Laterality: N/A;   MEMBRANE PEEL Left 09/20/2019   Procedure: Ottie Booty;  Surgeon: Alvia Norleen BIRCH, MD;  Location[facility] OR;  Service: Ophthalmology;  Laterality: Left;   PARS PLANA VITRECTOMY W/ SCLERAL BUCKLE Left 09/20/2019   Procedure: 25 Gauge PARS PLANA VITRECTOMY WITH LASER FOR MACULAR HOLE;  Surgeon: Alvia Norleen BIRCH, MD;  Location: [facility] OR;  Service: Ophthalmology;  Laterality: Left;   RIGHT HEART CATH N/A 03/03/2024   Procedure: RIGHT HEART CATH;  Surgeon: Wonda Sharper, MD;  Location: [facility] INVASIVE CV LAB;  Service: Cardiovascular;  Laterality: N/A;   SERUM PATCH Left 09/20/2019   Procedure: SERUM PATCH;  Surgeon: Alvia Norleen BIRCH, MD;  Location: [facility] OR;  Service: Ophthalmology;  Laterality: Left;   TONSILLECTOMY     VENTRICULAR ASSIST DEVICE INSERTION N/A 03/03/2024   Procedure: VENTRICULAR ASSIST DEVICE INSERTION;  Surgeon: Wonda Sharper, MD;  Location: [facility] INVASIVE CV LAB;  Service: Cardiovascular;  Laterality: N/A;   VITRECTOMY Left 09/20/2019    25 Gauge PARS PLANA VITRECTOMY WITH LASER FOR MACULAR HOLE (Left Eye)    Home Medications:  Medications Prior to Admission  Medication Sig Dispense Refill Last Dose/Taking   finasteride  (PROSCAR ) 5 MG tablet Take 5 mg by mouth daily.   03/03/2024 Morning   lisinopril -hydrochlorothiazide  (PRINZIDE ,ZESTORETIC ) 10-12.5 MG tablet Take 1 tablet by mouth daily.   03/03/2024 Morning   Multiple Vitamins-Minerals (MULTIVITAMIN WITH MINERALS) tablet Take 1 tablet by mouth daily.   03/03/2024 Morning   pantoprazole  (PROTONIX ) 40 MG tablet Take 40 mg by mouth daily.    03/03/2024 Morning   prednisoLONE  acetate (PRED FORTE ) 1 % ophthalmic suspension Place 1 drop into the left eye 4 (four) times daily. (Patient taking differently: Place 1 drop into the left eye daily as needed (eye inflamation).) 5 mL 0 Past Week   rosuvastatin  (CRESTOR ) 10 MG tablet Take 10 mg by mouth daily.   03/03/2024 Morning   tamsulosin  (FLOMAX ) 0.4 MG CAPS capsule Take 0.4 mg by mouth daily.    03/03/2024 Morning   acetaminophen  (TYLENOL ) 500 MG tablet Take 500 mg by mouth every 6 (six) hours as needed for mild pain or moderate pain.   Unknown   bacitracin -polymyxin b  (POLYSPORIN ) ophthalmic ointment Place 1 application into the left eye 3 (three) times daily. apply to eye every 12 hours while awake (Patient not taking: Reported on 03/03/2024) 3.5 g 0 Not Taking   Allergies:  Allergies  Allergen Reactions   Sulfa Antibiotics Rash    Family History  Problem Relation Age of Onset   Breast cancer Mother 53   Lung cancer Father 64       hx of smoking   Colon polyps Father        'had 9 polyps on his last  colonocospy'   Colon cancer Maternal Uncle 28   Lung cancer Paternal Aunt 33       hx smoking   Pancreatic cancer Paternal Uncle        hx of alcohol abuse   Heart disease Maternal Grandfather    Aortic aneurysm Maternal Grandfather    Kidney failure Paternal Grandmother    Prostate cancer Maternal Uncle        metastatic, died of this cancer in 70's/80's   Cirrhosis Paternal Uncle        hx of alcohol abuse   Breast cancer Cousin        9's   Breast cancer Cousin        73's   Breast cancer Cousin        72's   Social History:  reports that he has never smoked. He has never used smokeless tobacco. He reports that he does not currently use alcohol. He reports that he does not use drugs.  ROS: A complete review of systems was performed.  All systems are negative except for pertinent findings as noted. ROS   Physical Exam:  Vital signs in last 24 hours: Temp:  [98.8  F (37.1 C)-99.3 F (37.4 C)] 99.3 F (37.4 C) (11/25 1220) Pulse Rate:  [80-113] 92 (11/25 1230) Resp:  [16-34] 22 (11/25 1230) BP: (117-152)/(59-82) 122/68 (11/25 1200) SpO2:  [67 %-99 %] 91 % (11/25 1230) Arterial Line BP: (98-136)/(42-64) 107/53 (11/25 1230) Weight:  [88.7 kg] 88.7 kg (11/25 0600) General: Intubated, sedated and operating room Genitourinary: Circumcised phallus with three-way Foley catheter in place draining red on fast drip CBI.  Laboratory Data:  Results for orders placed or performed during the hospital encounter of 03/03/24 (from the past 24 hours)  Glucose, capillary     Status: Abnormal   Collection Time: 03/07/24  8:30 PM  Result Value Ref Range   Glucose-Capillary 138 (H) 70 - 99 mg/dL  Glucose, capillary     Status: Abnormal   Collection Time: 03/07/24 11:56 PM  Result Value Ref Range   Glucose-Capillary 124 (H) 70 - 99 mg/dL  Magnesium      Status: None   Collection Time: 03/08/24  4:40 AM  Result Value Ref Range   Magnesium  2.0 1.7 - 2.4 mg/dL  Cooxemetry Panel (carboxy, met, total hgb, O2 sat)     Status: Abnormal   Collection Time: 03/08/24  4:40 AM  Result Value Ref Range   Total hemoglobin 8.6 (L) 12.0 - 16.0 g/dL   O2 Saturation 40.2 %   Carboxyhemoglobin 1.8 (H) 0.5 - 1.5 %   Methemoglobin <0.7 0.0 - 1.5 %  Lactate dehydrogenase     Status: Abnormal   Collection Time: 03/08/24  4:40 AM  Result Value Ref Range   LDH 611 (H) 105 - 235 U/L  CBC     Status: Abnormal   Collection Time: 03/08/24  4:40 AM  Result Value Ref Range   WBC 7.7 4.0 - 10.5 K/uL   RBC 2.37 (L) 4.22 - 5.81 MIL/uL   Hemoglobin 7.4 (L) 13.0 - 17.0 g/dL   HCT 78.7 (L) 60.9 - 47.9 %   MCV 89.5 80.0 - 100.0 fL   MCH 31.2 26.0 - 34.0 pg   MCHC 34.9 30.0 - 36.0 g/dL   RDW 84.9 88.4 - 84.4 %   Platelets 63 (L) 150 - 400 K/uL   nRBC 0.0 0.0 - 0.2 %  Basic metabolic panel     Status: Abnormal  Collection Time: 03/08/24  4:40 AM  Result Value Ref Range   Sodium 131 (L)  135 - 145 mmol/L   Potassium 3.6 3.5 - 5.1 mmol/L   Chloride 99 98 - 111 mmol/L   CO2 22 22 - 32 mmol/L   Glucose, Bld 152 (H) 70 - 99 mg/dL   BUN 39 (H) 8 - 23 mg/dL   Creatinine, Ser 8.38 (H) 0.61 - 1.24 mg/dL   Calcium  7.4 (L) 8.9 - 10.3 mg/dL   GFR, Estimated 45 (L) >60 mL/min   Anion gap 10 5 - 15  Glucose, capillary     Status: Abnormal   Collection Time: 03/08/24  4:47 AM  Result Value Ref Range   Glucose-Capillary 127 (H) 70 - 99 mg/dL  Prepare platelet pheresis     Status: None (Preliminary result)   Collection Time: 03/08/24  7:21 AM  Result Value Ref Range   Unit Number [id number]    Blood Component Type PLTP2 PSORALEN TREATED    Unit division 00    Status of Unit ISSUED    Transfusion Status OK TO TRANSFUSE    Unit Number [id number]    Blood Component Type PLTP3 PSORALEN TREATED    Unit division 00    Status of Unit ISSUED    Transfusion Status      OK TO TRANSFUSE Performed at [facility] Lab, [address] [place], [place] [address]   Prepare RBC (crossmatch)     Status: None   Collection Time: 03/08/24  7:24 AM  Result Value Ref Range   Order Confirmation      ORDER PROCESSED BY BLOOD BANK BLOOD ALREADY AVAILABLE Performed at [facility], [address] [place], [place] [address]   Glucose, capillary     Status: Abnormal   Collection Time: 03/08/24  7:45 AM  Result Value Ref Range   Glucose-Capillary 117 (H) 70 - 99 mg/dL  Protime-INR     Status: None   Collection Time: 03/08/24  7:50 AM  Result Value Ref Range   Prothrombin Time 14.9 11.4 - 15.2 seconds   INR 1.1 0.8 - 1.2  Prepare RBC (crossmatch)     Status: None   Collection Time: 03/08/24 11:57 AM  Result Value Ref Range   Order Confirmation      ORDER PROCESSED BY BLOOD BANK Performed at [facility], [address], [place], [place] [address]   I-STAT, [place] 8     Status: Abnormal   Collection Time: 03/08/24  1:15 PM  Result Value Ref Range   Sodium 131 (L) 135 -  145 mmol/L   Potassium 4.5 3.5 - 5.1 mmol/L   Chloride 98 98 - 111 mmol/L   BUN 52 (H) 8 - 23 mg/dL   Creatinine, Ser 8.39 (H) 0.61 - 1.24 mg/dL   Glucose, Bld 893 (H) 70 - 99 mg/dL   Calcium , Ion 1.12 (L) 1.15 - 1.40 mmol/L   TCO2 23 22 - 32 mmol/L   Hemoglobin 8.8 (L) 13.0 - 17.0 g/dL   HCT 73.9 (L) 60.9 - 47.9 %  I-STAT 7, (LYTES, BLD GAS, ICA, H+H)     Status: Abnormal   Collection Time: 03/08/24  1:18 PM  Result Value Ref Range   pH, Arterial 7.406 7.35 - 7.45   pCO2 arterial 37.3 32 - 48 mmHg   pO2, Arterial 63 (L) 83 - 108 mmHg   Bicarbonate 23.4 20.0 - 28.0 mmol/L   TCO2 25 22 - 32  mmol/L   O2 Saturation 92 %   Acid-base deficit 1.0 0.0 - 2.0 mmol/L   Sodium 130 (L) 135 - 145 mmol/L   Potassium 4.5 3.5 - 5.1 mmol/L   Calcium , Ion 1.11 (L) 1.15 - 1.40 mmol/L   HCT 28.0 (L) 39.0 - 52.0 %   Hemoglobin 9.5 (L) 13.0 - 17.0 g/dL   Sample type ARTERIAL   I-STAT, chem 8     Status: Abnormal   Collection Time: 03/08/24  2:14 PM  Result Value Ref Range   Sodium 130 (L) 135 - 145 mmol/L   Potassium 4.0 3.5 - 5.1 mmol/L   Chloride 98 98 - 111 mmol/L   BUN 38 (H) 8 - 23 mg/dL   Creatinine, Ser 8.49 (H) 0.61 - 1.24 mg/dL   Glucose, Bld 885 (H) 70 - 99 mg/dL   Calcium , Ion 1.15 1.15 - 1.40 mmol/L   TCO2 23 22 - 32 mmol/L   Hemoglobin 8.8 (L) 13.0 - 17.0 g/dL   HCT 73.9 (L) 60.9 - 47.9 %  I-STAT 7, (LYTES, BLD GAS, ICA, H+H)     Status: Abnormal   Collection Time: 03/08/24  3:19 PM  Result Value Ref Range   pH, Arterial 7.385 7.35 - 7.45   pCO2 arterial 40.4 32 - 48 mmHg   pO2, Arterial 274 (H) 83 - 108 mmHg   Bicarbonate 24.2 20.0 - 28.0 mmol/L   TCO2 25 22 - 32 mmol/L   O2 Saturation 100 %   Acid-base deficit 1.0 0.0 - 2.0 mmol/L   Sodium 131 (L) 135 - 145 mmol/L   Potassium 3.9 3.5 - 5.1 mmol/L   Calcium , Ion 1.08 (L) 1.15 - 1.40 mmol/L   HCT 25.0 (L) 39.0 - 52.0 %   Hemoglobin 8.5 (L) 13.0 - 17.0 g/dL   Sample type ARTERIAL    Recent Results (from the past 240  hours)  MRSA Next Gen by PCR, Nasal     Status: None   Collection Time: 03/03/24  9:37 PM   Specimen: Nasal Mucosa; Nasal Swab  Result Value Ref Range Status   MRSA by PCR Next Gen NOT DETECTED NOT DETECTED Final    Comment: (NOTE) The GeneXpert MRSA Assay (FDA approved for NASAL specimens only), is one component of a comprehensive MRSA colonization surveillance program. It is not intended to diagnose MRSA infection nor to guide or monitor treatment for MRSA infections. Test performance is not FDA approved in patients less than 29 years old. Performed at [facility], [address], [place], [place] [address]    Creatinine: Recent Labs    03/05/24 1617 03/06/24 0325 03/07/24 0420 03/07/24 1408 03/08/24 0440 03/08/24 1315 03/08/24 1414  CREATININE 2.24* 2.00* 1.69* 1.58* 1.61* 1.60* 1.50*   CT scan personally reviewed and is detailed in the history of present illness  Impression/Assessment:  Gross hematuria Benign prostatic hyperplasia Right retroperitoneal hematoma Right angiomyolipoma  Plan:  Will proceed with cystoscopy with clot evacuation and fulguration with possible interventions.  I called his wife and obtained telephone consent given that he was intubated in the operating room upon my assessment.  I felt this was most appropriate rather than him undergoing 2 separate anesthesia procedures with his cardiac history.  Sherwood JONETTA Edison, III 03/08/2024, 5:15 PM

## 2024-03-08 NOTE — OR Nursing (Signed)
 Femoral stop device applied by Dr. Bensimhon at 1750. Will continue to monitor

## 2024-03-08 NOTE — Progress Notes (Signed)
 NAME:  Amro Winebarger Avilla, MRN:  [id number], DOB:  July 23, 1950, LOS: 5 ADMISSION DATE:  03/03/2024, CONSULTATION DATE: 03/03/2024 REFERRING MD: Dr. Ezra, CHIEF COMPLAINT: Chest pain  History of Present Illness:  73 year old male with hypertension, hyperlipidemia, BPH, CKD 3a and prediabetes who presented as a code STEMI, after he developed chest pain while working at home, he was emergently taken to Cath Lab, underwent LHC.  During procedure he was hypotensive requiring Levophed  infusion, vomited and aspirated requiring nonrebreather facemask.  His LVEDP was 30 with wedge pressure of 24, CPAP was placed after that he went into brief VT/VF and was shocked.  Patient was noted to have occluded proximal LAD, underwent PCI and DES. Post procedure complicated by cardiogenic shock with Impella CP placement  Pertinent  Medical History   Past Medical History:  Diagnosis Date   Arthritis    osteo   Back pain    Benign prostatic hyperplasia with urinary obstruction and other lower urinary tract symptoms    Cancer (HCC)    maligant polyp   Dyslipidemia    Dysplastic nevus 08/13/2006   L chest pectoral - mod, excision 06.10.2008   Dysplastic nevus 09/29/2006   L groin - mild   Elevated PSA    Enlarged prostate    Family history of breast cancer    Family history of colon cancer    Family history of lung cancer    Family history of pancreatic cancer    Family history of prostate cancer    GERD (gastroesophageal reflux disease)    Headache    migraine without aura   HOH (hard of hearing)    Hypercholesterolemia    Hypertension    Personal history of colon cancer 01/19/2018   Scoliosis      Significant Hospital Events: Including procedures, antibiotic start and stop dates in addition to other pertinent events   11/20: STEMI with PCI to LAD, Impella CP placement; RP bleed noted 11/24 CBI for hematuria  Interim History / Subjective:  Ongoing pressor need  amiodarone  30 mg/hr (03/08/24 0700)    ampicillin -sulbactam (UNASYN ) IV Stopped (03/08/24 0342)   DOBUTamine  4 mcg/kg/min (03/08/24 0700)   norepinephrine  (LEVOPHED ) Adult infusion 5 mcg/min (03/08/24 0700)   promethazine  (PHENERGAN ) injection (IM or IVPB) Stopped (03/03/24 2233)   sodium bicarbonate  25 mEq (Impella PURGE) in dextrose  5 % 1000 mL bag     sodium chloride  irrigation     vasopressin  0.03 Units/min (03/08/24 0700)     Objective    Blood pressure (!) 146/71, pulse 97, temperature 98.8 F (37.1 C), resp. rate (!) 22, height 5' 10 (1.778 m), weight 88.7 kg, SpO2 91%. PAP: (31-46)/(9-21) 41/18 CVP:  [4 mmHg-16 mmHg] 8 mmHg PCWP:  [13 mmHg-20 mmHg] 20 mmHg CO:  [5.3 L/min-6.4 L/min] 6.4 L/min CI:  [2.7 L/min/m2-3.24 L/min/m2] 3.24 L/min/m2      Intake/Output Summary (Last 24 hours) at 03/08/2024 9278 Last data filed at 03/08/2024 0700 Gross per 24 hour  Intake 7655.28 ml  Output 8240 ml  Net -584.72 ml   Filed Weights   03/06/24 0500 03/07/24 0500 03/08/24 0600  Weight: 90.4 kg 88.3 kg 88.7 kg    Examination: No distress More confused today R arm bruising noted Abd soft, hypoactive BS +anasarca Lungs sounds okay Ext warm Moves everything Impella site soft, mild bruising Ongoing hematuria with CBI  Patient Lines/Drains/Airways Status     Active Line/Drains/Airways     Name Placement date Placement time Site Days   Arterial Line  03/03/24 Left Radial 03/03/24  1836  Radial  5   Peripheral IV 03/06/24 22 G 1.75 Anterior;Distal;Right Forearm 03/06/24  1527  Forearm  2   Peripheral IV 03/06/24 20 G 1.88 Anterior;Right Forearm 03/06/24  1531  Forearm   2   Sheath 03/03/24 Right Internal Jugular 03/03/24  1644  Internal Jugular  5   Impella 03/03/24  1522  -- 5   Urethral Catheter Kate Ada, RN Coude 22 Fr. 03/07/24  1515  Coude  1   Pulmonary Artery Catheter 03/03/24 Right 50 03/03/24  1900  -- 5            amiodarone  30 mg/hr (03/08/24 0700)   ampicillin -sulbactam (UNASYN ) IV Stopped  (03/08/24 0342)   DOBUTamine  4 mcg/kg/min (03/08/24 0700)   norepinephrine  (LEVOPHED ) Adult infusion 5 mcg/min (03/08/24 0700)   promethazine  (PHENERGAN ) injection (IM or IVPB) Stopped (03/03/24 2233)   sodium bicarbonate  25 mEq (Impella PURGE) in dextrose  5 % 1000 mL bag     sodium chloride  irrigation     vasopressin  0.03 Units/min (03/08/24 0700)     Resolved problem list   Assessment and Plan  Acute anterior STEMI status post PCI with DES to LAD 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 - Incidental finding, outpatient follow up needed ICU delirium   - Unasyn , 5 days stop date in - Continue DAPT for now, usual transfusion thresholds: giving 2 units pre-op today - To OR for 5-5 impella, platelets to be given intra-op - Pressors for MAP 65 - Diuretics, intropes, impella (bicarb purge) per AHF; AC on hold - Check coags, transfuse PRN - Continue CBI until clearing, if ongoing can get urology input but not really sure what could be offered - After groin impella out would give enema - Add at bedtime seroquel  + trazadone - Reorient PRN, encourage day/night cycles and family visitation, minimize nightly disruptions - Guarded prognosis  32 minutes CC time Rolan Sharps MD PCCM

## 2024-03-08 NOTE — Op Note (Signed)
 Operative Note  Preoperative diagnosis:  1.  Gross hematuria  Postoperative diagnosis: 1.  Gross hematuria secondary to BPH  Procedure(s): 1.  Cystoscopy with clot evacuation and fulguration  Surgeon: Sherwood Edison, MD  Assistants: None  Anesthesia: General  Complications: None immediate  EBL: Minimal  Specimens: 1.  None  Drains/Catheters: 1.  22 French three-way Foley catheter  Intraoperative findings: 1.  No strictures along the anterior urethra but had significant irritation and edema along the urethra.  Prostatic urethra with bilobar hypertrophy and hypervascularity with active oozing of bleeding.  Bladder mucosa with a large amount of clot that was evacuated.  There was some catheter edema with some mild bleeding from the catheter edema but no lesion mass or stone.  Indication: 73 year old male with gross hematuria presents for the previously mentioned operation.  Description of procedure:  Upon evaluation, the patient was under general anesthesia in preparation for another surgery.  I evaluated his catheter and he had significant gross hematuria despite manual irrigation.  I felt it most necessary to proceed with intervention and I called his wife and obtain telephone consent.  Perioperative antibiotics were administered.  Dr. Lucas completed his portion of the case. The patient was placed in dorsal lithotomy.  It was requested that his right leg not be flexed at all so we kept his leg completely straight in the stirrups.  Left leg was slightly bent.  Patient was prepped and draped in a standard sterile fashion and a timeout was performed.  26 French resectoscope with visual obturator in place was advanced into the urethra and into the bladder.  Complete cystoscopy was performed with findings noted above.  Large amount of clot was evacuated.  There is a small amount of oozing at the posterior bladder wall where there was some catheter edema and I fulgurated this.  There was  no lesion mass or stone however.  No evidence of any malignancy.  I inspected the prostate and bladder neck.  There was significant oozing from the prostate and this was fulgurated.  Once there was good hemostasis, I withdrew the scope and placed a 22 French three-way catheter and initiated continuous bladder irrigation.  This concluded my portion of the operation.  Plan: Continue continuous bladder irrigation and slowly wean off.  Continue finasteride .

## 2024-03-08 NOTE — Plan of Care (Signed)
?  Problem: Clinical Measurements: ?Goal: Respiratory complications will improve ?Outcome: Progressing ?  ?Problem: Clinical Measurements: ?Goal: Cardiovascular complication will be avoided ?Outcome: Progressing ?  ?Problem: Clinical Measurements: ?Goal: Diagnostic test results will improve ?Outcome: Progressing ?  ?

## 2024-03-08 NOTE — Progress Notes (Signed)
   Patient back from OR after Impella 5.5 placement.   Patient with small hematoma at groin site and manual pressure held. Fem-stop replaced   Norva had also been pulled back so I advanced. Hemodynamics reviewed personally. Drips adjusted  CXR with adequate positioning of lines and devices. + pulmonary edema.  Lasix  40mg  IV given  Patient to remain intubated this evening. Attempt vent wean in am.   Family updated.   Critical care time 65 mins  [name], MD  7:49 PM

## 2024-03-08 NOTE — Progress Notes (Signed)
  Echocardiogram Echocardiogram Transesophageal has been performed.  Timothy Nicholson 03/08/2024, 2:13 PM

## 2024-03-08 NOTE — OR Nursing (Signed)
 Dr. Cherrie notified at 1718 of hematoma on right groin under femoral stop device. Instructed to loosen femoral stop device and hold additional 10 minutes of pressure. Dr. Bensimhon was told that OR staff was not able to reapply femoral stop once device was off and stated he would see patient in unit. Will continue to monitor

## 2024-03-08 NOTE — Interval H&P Note (Signed)
 History and Physical Interval Note:  03/08/2024 12:19 PM  Timothy Nicholson  has presented today for surgery, with the diagnosis of CARDIOGENIC SHOCK.  The various methods of treatment have been discussed with the patient and family. After consideration of risks, benefits and other options for treatment, the patient has consented to  Procedure(s) with comments: INSERTION, CARDIAC ASSIST DEVICE, IMPELLA (N/A) - IMPELLA 5.5 INSERTION ECHOCARDIOGRAM, TRANSESOPHAGEAL, INTRAOPERATIVE (N/A) as a surgical intervention.  The patient's history has been reviewed, patient examined, no change in status, stable for surgery.  I have reviewed the patient's chart and labs.  Questions were answered to the patient's satisfaction.     Jamara Vary K Garrus Gauthreaux

## 2024-03-08 NOTE — Op Note (Signed)
 CARDIOVASCULAR SURGERY OPERATIVE NOTE  03/08/24  Surgeon:  Dorise LOIS Fellers, MD  First Assistant: RNFA   Preoperative Diagnosis:  Cardiogenic shock secondary to NSTEMI and LVEF 25%   Postoperative Diagnosis:  Same   Procedure:  Right axillary artery exposure, anastomosis of 10 mm Hemashield graft for Impella insertion.  Anesthesia:  General Endotracheal   Clinical History/Surgical Indication:  Cardiogenic shock dependent on inotropes/vasopressors/Impella CP s/p anterior STEMI with EF 25% by echo. I agree with conversion to Impella 5.5 via the right axillary artery to allow continued support while getting him up out of bed and mobilized. I discussed the operative procedure with the patient and family including alternatives, benefits and risks; including but not limited to bleeding, blood transfusion, infection, stroke, myocardial infarction, heart block requiring a permanent pacemaker, organ dysfunction, and death.  Kohle Winner Bugge understands and agrees to proceed.    Preparation:  The patient was taken directed from the ICU to the OR. The consent was signed by me. Preoperative antibiotics were given.  After being placed under general endotracheal anesthesia by the anesthesia team the neck, chest, abdomen and groins were prepped with betadine soap and solution and draped in the usual sterile manner. A surgical time-out was taken and the correct patient and operative procedure were confirmed with the nursing and anesthesia staff.   Right axillary artery exposure and cannulation:  A transverse incision was made below the right clavicle. The pectoralis major muscle was split along its fibers and the pectoralis minor muscle was retracted laterally. The brachial plexus was identified and gently retracted laterally to expose the axillary artery. The artery was controlled proximally and distally with vessel loops. The patient was heparinized and ACT maintained greater than 250. The axillary  artery was clamped proximally and distally with peripheral Debakey clamps. It was opened longitudinally.  A 10 mm Hemashield dacron graft was anastomosed in an end to side manner using continuous 5-0 prolene suture. [place] was applied for hemostasis and the clamp removed. A 1 cm incision was made inferior to this along the anterior axillary line and the graft tunneled in the subcutaneous plane.   The Impella 5.5 was inserted by Dr. Bensimhon and will be dictated by him. The wound was hemostatic. A 15 Fr Blake drain was brought through a stab incision and positioned in the sub-pectoral incision since he was on Brilinta . The pectoralis muscle was approximated with continuous 2-0 Vicryl suture. The subcutaneous tissue was approximated with continuous 2-0 Vicryl suture. The skin was approximated with continuous 3-0 Vicryl subcuticular suture.   All sponge, needle, and instrument counts were reported correct at the end of the case. Dry sterile dressings were placed over the incision. The Impella was anchored to the chest wall. The patient was then transported to the surgical intensive care unit in critical but stable condition.

## 2024-03-08 NOTE — Anesthesia Preprocedure Evaluation (Addendum)
 Anesthesia Evaluation  Patient identified by MRN, date of birth, ID band Patient awake    Reviewed: Allergy & Precautions, H&P , NPO status , Patient's Chart, lab work & pertinent test results  Airway Mallampati: II  TM Distance: >3 FB Neck ROM: Full    Dental no notable dental hx.    Pulmonary pneumonia Treated for aspiration PNA   Pulmonary exam normal breath sounds clear to auscultation       Cardiovascular hypertension, + Past MI, + Cardiac Stents and +CHF  Normal cardiovascular exam Rhythm:Regular Rate:Normal  Acute cardiogenic shock.  Impella CP in place  Right fem acccess   Neuro/Psych  Headaches    GI/Hepatic ,GERD  ,,  Endo/Other    Renal/GU ARFRenal disease     Musculoskeletal  (+) Arthritis ,    Abdominal   Peds  Hematology  (+) Blood dyscrasia, anemia   Anesthesia Other Findings   Reproductive/Obstetrics                              Anesthesia Physical Anesthesia Plan  ASA: 4  Anesthesia Plan: General   Post-op Pain Management:    Induction: Intravenous  PONV Risk Score and Plan: 2 and Ondansetron , Dexamethasone  and Treatment Kauth vary due to age or medical condition  Airway Management Planned: Oral ETT  Additional Equipment: Arterial line and TEE  Intra-op Plan:   Post-operative Plan: Post-operative intubation/ventilation  Informed Consent: I have reviewed the patients History and Physical, chart, labs and discussed the procedure including the risks, benefits and alternatives for the proposed anesthesia with the patient or authorized representative who has indicated his/her understanding and acceptance.     Dental advisory given  Plan Discussed with: CRNA, Anesthesiologist and Surgeon  Anesthesia Plan Comments:          Anesthesia Quick Evaluation

## 2024-03-08 NOTE — Anesthesia Procedure Notes (Signed)
 Procedure Name: Intubation Date/Time: 03/08/2024 1:03 PM  Performed by: Jama Powell NOVAK, CRNAPre-anesthesia Checklist: Patient identified, Timeout performed, Emergency Drugs available, Suction available and Patient being monitored Patient Re-evaluated:Patient Re-evaluated prior to induction Oxygen Delivery Method: Circle system utilized Preoxygenation: Pre-oxygenation with 100% oxygen Induction Type: IV induction Ventilation: Mask ventilation without difficulty Laryngoscope Size: Miller, 3, Mac and 4 Grade View: Grade III Tube type: Oral Tube size: 8.0 mm Number of attempts: 2 Airway Equipment and Method: Stylet Placement Confirmation: breath sounds checked- equal and bilateral, CO2 detector, positive ETCO2 and ETT inserted through vocal cords under direct vision Secured at: 23 cm Tube secured with: Tape Dental Injury: Teeth and Oropharynx as per pre-operative assessment

## 2024-03-08 NOTE — Progress Notes (Addendum)
 Patient ID: Timothy Nicholson, male   DOB: March 25, 1951, 73 y.o.   MRN: [id number]     Advanced Heart Failure Rounding Note  Cardiologist: None  Chief Complaint: AMI CS   Patient Profile:    73 y/o male admitted w/ acute anterior STEMI c/b CS requiring Impella CP support, further c/b + RPB/hemorrhagic shock.   Subjective:    Impella CP @ P7, Flow 3.4L. No flow alarms     On DBA 4, VP 0.03 and NE 4 (down from 13)  Swan #s CVP 6 PA 37/14 TD CO/CI 6.41/3.25 FICK CI 3.2  SVR 886 Co-ox 60%   Scr  2.2 -> 2.0->1.69->1.61. UOP not accurate d/t CBI    Continues w/ hematuria   Hgb 7.4   Plts 66>>63K  Also w/ ileus. Last BM 4 days ago. Abdominal plain film yesterday w/ large stool burden.   Denies CP. No dyspnea. A bit delirious overnight/ this morning per nursing report.    Objective:   Weight Range: 88.7 kg Body mass index is 28.06 kg/m.   Vital Signs:   Temp:  [98.8 F (37.1 C)-99.3 F (37.4 C)] 98.8 F (37.1 C) (11/25 0800) Pulse Rate:  [74-105] 92 (11/25 0800) Resp:  [18-33] 23 (11/25 0800) BP: (96-152)/(40-85) 142/77 (11/25 0800) SpO2:  [67 %-96 %] 91 % (11/25 0800) Arterial Line BP: (92-129)/(49-60) 122/56 (11/25 0800) Weight:  [88.7 kg] 88.7 kg (11/25 0600) Last BM Date : 03/04/24  Weight change: Filed Weights   03/06/24 0500 03/07/24 0500 03/08/24 0600  Weight: 90.4 kg 88.3 kg 88.7 kg    Intake/Output:   Intake/Output Summary (Last 24 hours) at 03/08/2024 0833 Last data filed at 03/08/2024 0800 Gross per 24 hour  Intake 7592.24 ml  Output 8385 ml  Net -792.76 ml      Physical Exam   CVP 6  GENERAL: fatigued appearing, NAD  Lungs- clear  CARDIAC:  + RIJ Swan, JVP not elevated.          Normal rate with regular rhythm. No MRG. No LEE  ABDOMEN: Soft, non-tender, non-distended. Hypoactive BS  EXTREMITIES: Warm and well perfused. + Rt fem Impella CP w/ Rt Groin hematoma NEUROLOGIC: mild delirium  GU: + foley w/ gross hematuria   Telemetry   NSR  90s Personally reviewed  Labs    CBC Recent Labs    03/07/24 1408 03/08/24 0440  WBC 8.1 7.7  HGB 8.4* 7.4*  HCT 23.8* 21.2*  MCV 89.5 89.5  PLT 64* 63*   Basic Metabolic Panel Recent Labs    88/75/74 0420 03/07/24 1408 03/08/24 0440  NA 130* 130* 131*  K 3.5 3.7 3.6  CL 97* 99 99  CO2 23 21* 22  GLUCOSE 122* 141* 152*  BUN 36* 37* 39*  CREATININE 1.69* 1.58* 1.61*  CALCIUM  7.3* 7.4* 7.4*  MG 2.1  --  2.0   Liver Function Tests Recent Labs    03/06/24 0325 03/07/24 0420  AST 112* 55*  ALT 43 33  ALKPHOS 26* 32*  BILITOT 1.1 1.0  PROT 5.4* 5.1*  ALBUMIN  3.0* 2.5*   No results for input(s): LIPASE, AMYLASE in the last 72 hours. Cardiac Enzymes No results for input(s): CKTOTAL, CKMB, CKMBINDEX, TROPONINI in the last 72 hours.  BNP: BNP (last 3 results) No results for input(s): BNP in the last 8760 hours.  ProBNP (last 3 results) No results for input(s): PROBNP in the last 8760 hours.   D-Dimer No results for input(s): DDIMER in the last  72 hours. Hemoglobin A1C No results for input(s): HGBA1C in the last 72 hours.  Fasting Lipid Panel No results for input(s): CHOL, HDL, LDLCALC, TRIG, CHOLHDL, LDLDIRECT in the last 72 hours.  Thyroid Function Tests No results for input(s): TSH, T4TOTAL, T3FREE, THYROIDAB in the last 72 hours.  Invalid input(s): FREET3  Other results:   Imaging    No results found.     Medications:     Scheduled Medications:  sodium chloride    Intravenous Once   sodium chloride    Intravenous Once   sodium chloride    Intravenous Once   aspirin   81 mg Oral Daily   Chlorhexidine  Gluconate Cloth  6 each Topical Daily   feeding supplement  237 mL Oral TID BM   finasteride   5 mg Oral Daily   insulin  aspart  0-15 Units Subcutaneous Q4H   [START ON 03/09/2024] insulin  glargine-yfgn  5 Units Subcutaneous Daily   metoCLOPramide  (REGLAN ) injection  10 mg Intravenous Q8H   mouth  rinse  15 mL Mouth Rinse 4 times per day   pantoprazole   40 mg Oral Daily   polyethylene glycol  17 g Oral Daily   potassium chloride   40 mEq Oral Once   QUEtiapine   25 mg Oral QHS   rosuvastatin   20 mg Oral Daily   sodium chloride  flush  3 mL Intravenous Q12H   tamsulosin   0.4 mg Oral Daily   ticagrelor   90 mg Oral BID   traZODone   50 mg Oral QHS    Infusions:  amiodarone  30 mg/hr (03/08/24 0800)   ampicillin -sulbactam (UNASYN ) IV Stopped (03/08/24 0342)   calcium  gluconate     DOBUTamine  4 mcg/kg/min (03/08/24 0800)   norepinephrine  (LEVOPHED ) Adult infusion 4 mcg/min (03/08/24 0800)   promethazine  (PHENERGAN ) injection (IM or IVPB) Stopped (03/03/24 2233)   sodium bicarbonate  25 mEq (Impella PURGE) in dextrose  5 % 1000 mL bag     sodium chloride  irrigation     vasopressin  0.03 Units/min (03/08/24 0800)    PRN Medications: acetaminophen , HYDROmorphone  (DILAUDID ) injection, iohexol , melatonin, ondansetron  (ZOFRAN ) IV, mouth rinse, oxyCODONE , promethazine  (PHENERGAN ) injection (IM or IVPB), simethicone , sodium chloride  flush     Assessment/Plan   1. CAD: Anterior STEMI with occluded proximal LAD; the RCA was not selectively engaged but appeared to be relatively small with proximal occlusion. Now s/p DES to proximal LAD and PTCA to ostial D1.  - stable no s/s angina - continue ASA/statin/Brilinta  - management of post MI shock as below 2. Cardiogenic shock: Due to anterior MI.  Limited echo with Impella showed EF 25% by my read with LAD territory akinesis, normal RV size/mildly decreased systolic function.  - Continues to require significant support, Impella CP at P-7 + DBA 4, NE 4 and VP 0.03, CI 3.25, CVP 6 - Plan to go to OR today for Impella 5.5 to give extended time for recovery vs bridge to durable LVAD  - continue to hold diuretics  - wean NE as tolerated, MAP Goal 70  3. AKI on CKD stage 3: Creatinine 1.6 => 2.1 -> 2.3 -> 2.0->1.69->1.61 with cardiogenic/hemorrhagic  shock. Also had contrast loads with cath and CTA.   - Follow closely, support CO per above   4. VT: Peri-STEMI. - VT quiescent  - Continue amiodarone  gtt 30 mg/hr for now  5. Heme: Hemorrhagic component to shock with hematoma at Impella access site and RP hematoma + hematuria.  CTA showed RP hematoma without active extravasation.  - No heparin  as above (HCO3 purge).  -  hgb 7.4 today. Transfuse Hgb < 8. Will give 1uRBCs prior to OR  - follow CBC  6. Pulmonary: Possible aspiration PNA.  - completed 5 day course of Unasyn .  7. Hematuria - likely local trauma.  - CBI  8. Delirium - Management per CCM  9. Thrombocytopenia - trending down, 63K today  - d/w CCM, will give plts ahead of OR 10. Illeus: last BM 11/21. Abd x-ray w/ large stool burden - d/w CCM, plan enema post OR   CRITICAL CARE Performed by: Caffie Shed   Total critical care time: 15 minutes  Critical care time was exclusive of separately billable procedures and treating other patients.  Critical care was necessary to treat or prevent imminent or life-threatening deterioration.  Critical care was time spent personally by me on the following activities: development of treatment plan with patient and/or surrogate as well as nursing, discussions with consultants, evaluation of patient's response to treatment, examination of patient, obtaining history from patient or surrogate, ordering and performing treatments and interventions, ordering and review of laboratory studies, ordering and review of radiographic studies, pulse oximetry and re-evaluation of patient's condition.     Length of Stay: [address], PA-C  03/08/2024, 8:33 AM  Advanced Heart Failure Team Pager [phone] (M-F; 7a - 5p)  Please contact [facility] for night-coverage after hours (5p -7a ) and weekends on [email or web address]    Agree with above.  Remains on Impella at P-7 flow 3.3  NE down to 4 VP 0.03 DBA at 4 Outputs ok   Having trouble  with urinary retention  Hgb back down  PLTs 63k  General:  Lying in bed No resp difficulty HEENT: normal Neck: supple. RIJ swan Cor: Regular rate & rhythm. No rubs, gallops or murmurs. Lungs: clear Abdomen: soft, nontender + bladder dome distended Extremities: no cyanosis, clubbing, rash, edema Neuro: alert & orientedx3, cranial nerves grossly intact. moves all 4 extremities w/o difficulty. Affect pleasant  We were able to wean NE a bit overnight. I attempted to wean Impella down to P-3 but did not tolerated well with increased LVEDP and SOB so impella turned back up.   Given failure to tolerate Impella wean will proceed with Impella 5.5 placement.   Foley appears clogged. It has been irrigated and declotted  CRITICAL CARE Performed by: Cherrie Sieving  Total critical care time: 50 minutes  Critical care time was exclusive of separately billable procedures and treating other patients.  Critical care was necessary to treat or prevent imminent or life-threatening deterioration.  Critical care was time spent personally by me (independent of midlevel providers or residents) on the following activities: development of treatment plan with patient and/or surrogate as well as nursing, discussions with consultants, evaluation of patient's response to treatment, examination of patient, obtaining history from patient or surrogate, ordering and performing treatments and interventions, ordering and review of laboratory studies, ordering and review of radiographic studies, pulse oximetry and re-evaluation of patient's condition.  Sieving Cherrie, MD  2:02 PM

## 2024-03-08 NOTE — Transfer of Care (Signed)
 Immediate Anesthesia Transfer of Care Note  Patient: Timothy Nicholson  Procedure(s) Performed: INSERTION, CARDIAC ASSIST DEVICE, IMPELLA ECHOCARDIOGRAM, TRANSESOPHAGEAL, INTRAOPERATIVE CYSTOSCOPY CLOT EVACUATION AND FULGURATION  Patient Location: SICU  Anesthesia Type:General  Level of Consciousness: Patient remains intubated per anesthesia plan  Airway & Oxygen Therapy: Patient remains intubated per anesthesia plan and Patient placed on Ventilator (see vital sign flow sheet for setting)  Post-op Assessment: Report given to RN and Post -op Vital signs reviewed and stable  Post vital signs: Reviewed and stable  Last Vitals:  Vitals Value Taken Time  BP 120/84   Temp    Pulse 72   Resp 16   SpO2 100     Last Pain:  Vitals:   03/08/24 1200  TempSrc: Core  PainSc: 0-No pain      Patients Stated Pain Goal: 2 (03/07/24 1253)  Complications: No notable events documented.

## 2024-03-08 NOTE — CV Procedure (Signed)
  CV Procedure Note  Pre-op Diagnosis: Cardiogenic shock  Post-operative Diagnosis: Cardiogenic shock  Procedure(s): 1. Insertion of Impella 5.5  2. Removal of Impella CP  Operator: [name], MD'  Description of the procedure:   The patient was consented and brought to the OR by Dr. Lucas. He was placed under endotracheal anesthesia. Dr. Lucas then placed a right axillary graft. Once the graft was in place, a JR-4 catheter was placed through the graft with an 0.035 and passed into the aortic root under fluoroscopic guidance. The JR-4 was removed and a pigtail catheter was placed over the wire into the aortic root. The pigtail was then prolapsed across the aortic valve and the 0.035 wire was removed. The Impella wire was placed through the pigtail wrapping the apex and up the anterior wall. The pigtail was then walked back and pulled out of the body retaining wire position around the LV apex. The Impella 5.5 was brought to the operative field and loaded on the back of the wire. The graft was clamped proximally and the Impella was inserted into the graft. The clamp was then released and under fluoro guidance the Impella 5.5 was advanced over the wire into the LV next to the pre-existing CP device. The wire was removed and the Impella 5.5 was turned on with good flows. The Impella CP was then turned down to P-1 and pulled back into the descending aorta. The position of the Impella 5.5 was optimized under TEE guidance. The peel away sheath was then removed and the graft was cut down to size by Dr. Lucas. The repositioning sleeve was advanced into the graft and secured by Dr. Lucas. The wound was then closed.  Attention was then turned to the Impella CP access site in the right groin. The site had already been prepped sterilely. The sutures were removed and device mobilized. I placed three 0 silk sutures vertically across the arteriotomy site. The impella was then pulled down to the sheath  and the device was turned off. I pulled the device and sheath together and immediately applied manual pressure to the arteriotomy site. The three sutures were then secured by the OR staff and I held pressure for approximately 40 minutes with excellent hemostasis. A fem-stop was applied. There were Dopplerable pulses in both there R DP and PT.   Dr. Carolee and the Urology team then took over to perform cystocopy on the patient  No apparent complications.   EBL < 50cc.   [name], MD  6:50 PM

## 2024-03-08 NOTE — OR Nursing (Signed)
 Dr. Cherrie notified at 1727 that 10 mins of pressure held. Hemoglobin rechecked and hemoglobin drop was established from 9.0 to 6.8. Dr. Cherrie instructed to give 2 units RBCs to patient. Will continue to monitor.

## 2024-03-09 ENCOUNTER — Encounter (HOSPITAL_COMMUNITY): Payer: Self-pay | Admitting: Surgery

## 2024-03-09 ENCOUNTER — Inpatient Hospital Stay (HOSPITAL_COMMUNITY)

## 2024-03-09 DIAGNOSIS — Z95811 Presence of heart assist device: Secondary | ICD-10-CM | POA: Diagnosis not present

## 2024-03-09 DIAGNOSIS — J9601 Acute respiratory failure with hypoxia: Secondary | ICD-10-CM | POA: Diagnosis not present

## 2024-03-09 DIAGNOSIS — R570 Cardiogenic shock: Secondary | ICD-10-CM | POA: Diagnosis not present

## 2024-03-09 DIAGNOSIS — I2102 ST elevation (STEMI) myocardial infarction involving left anterior descending coronary artery: Secondary | ICD-10-CM | POA: Diagnosis not present

## 2024-03-09 DIAGNOSIS — I5021 Acute systolic (congestive) heart failure: Secondary | ICD-10-CM | POA: Diagnosis not present

## 2024-03-09 LAB — CBC
HCT: 30.7 % — ABNORMAL LOW (ref 39.0–52.0)
Hemoglobin: 10.7 g/dL — ABNORMAL LOW (ref 13.0–17.0)
MCH: 30.7 pg (ref 26.0–34.0)
MCHC: 34.9 g/dL (ref 30.0–36.0)
MCV: 88 fL (ref 80.0–100.0)
Platelets: 92 K/uL — ABNORMAL LOW (ref 150–400)
RBC: 3.49 MIL/uL — ABNORMAL LOW (ref 4.22–5.81)
RDW: 15.4 % (ref 11.5–15.5)
WBC: 8.4 K/uL (ref 4.0–10.5)
nRBC: 0 % (ref 0.0–0.2)

## 2024-03-09 LAB — BPAM PLATELET PHERESIS
Blood Product Expiration Date: 202511252359
Blood Product Expiration Date: 202511252359
ISSUE DATE / TIME: 202511251159
ISSUE DATE / TIME: 202511251159
Unit Type and Rh: 6200
Unit Type and Rh: 7300

## 2024-03-09 LAB — PREPARE PLATELET PHERESIS
Unit division: 0
Unit division: 0

## 2024-03-09 LAB — POCT I-STAT 7, (LYTES, BLD GAS, ICA,H+H)
Acid-Base Excess: 1 mmol/L (ref 0.0–2.0)
Bicarbonate: 25.9 mmol/L (ref 20.0–28.0)
Calcium, Ion: 1.05 mmol/L — ABNORMAL LOW (ref 1.15–1.40)
HCT: 29 % — ABNORMAL LOW (ref 39.0–52.0)
Hemoglobin: 9.9 g/dL — ABNORMAL LOW (ref 13.0–17.0)
O2 Saturation: 98 %
Patient temperature: 35.9
Potassium: 3.7 mmol/L (ref 3.5–5.1)
Sodium: 131 mmol/L — ABNORMAL LOW (ref 135–145)
TCO2: 27 mmol/L (ref 22–32)
pCO2 arterial: 38.3 mmHg (ref 32–48)
pH, Arterial: 7.433 (ref 7.35–7.45)
pO2, Arterial: 95 mmHg (ref 83–108)

## 2024-03-09 LAB — TYPE AND SCREEN
ABO/RH(D): A NEG
Antibody Screen: NEGATIVE
Unit division: 0
Unit division: 0
Unit division: 0
Unit division: 0
Unit division: 0

## 2024-03-09 LAB — BPAM RBC
Blood Product Expiration Date: 202512212359
Blood Product Expiration Date: 202512212359
Blood Product Expiration Date: 202512202359
Blood Product Expiration Date: 202512202359
Blood Product Expiration Date: 202512202359
ISSUE DATE / TIME: 202511251213
ISSUE DATE / TIME: 202511251213
ISSUE DATE / TIME: 202511240958
ISSUE DATE / TIME: 202511250834
ISSUE DATE / TIME: 202511250834
Unit Type and Rh: 600
Unit Type and Rh: 600
Unit Type and Rh: 600
Unit Type and Rh: 600
Unit Type and Rh: 600

## 2024-03-09 LAB — GLUCOSE, CAPILLARY
Glucose-Capillary: 133 mg/dL — ABNORMAL HIGH (ref 70–99)
Glucose-Capillary: 113 mg/dL — ABNORMAL HIGH (ref 70–99)
Glucose-Capillary: 98 mg/dL (ref 70–99)
Glucose-Capillary: 106 mg/dL — ABNORMAL HIGH (ref 70–99)
Glucose-Capillary: 91 mg/dL (ref 70–99)
Glucose-Capillary: 111 mg/dL — ABNORMAL HIGH (ref 70–99)

## 2024-03-09 LAB — HEPATIC FUNCTION PANEL
ALT: 33 U/L (ref 0–44)
AST: 35 U/L (ref 15–41)
Albumin: 2.4 g/dL — ABNORMAL LOW (ref 3.5–5.0)
Alkaline Phosphatase: 42 U/L (ref 38–126)
Bilirubin, Direct: 0.3 mg/dL — ABNORMAL HIGH (ref 0.0–0.2)
Indirect Bilirubin: 1.2 mg/dL — ABNORMAL HIGH (ref 0.3–0.9)
Total Bilirubin: 1.5 mg/dL — ABNORMAL HIGH (ref 0.0–1.2)
Total Protein: 5.1 g/dL — ABNORMAL LOW (ref 6.5–8.1)

## 2024-03-09 LAB — BASIC METABOLIC PANEL WITH GFR
Anion gap: 10 (ref 5–15)
BUN: 38 mg/dL — ABNORMAL HIGH (ref 8–23)
CO2: 22 mmol/L (ref 22–32)
Calcium: 7.4 mg/dL — ABNORMAL LOW (ref 8.9–10.3)
Chloride: 100 mmol/L (ref 98–111)
Creatinine, Ser: 1.66 mg/dL — ABNORMAL HIGH (ref 0.61–1.24)
GFR, Estimated: 43 mL/min — ABNORMAL LOW
Glucose, Bld: 139 mg/dL — ABNORMAL HIGH (ref 70–99)
Potassium: 3.6 mmol/L (ref 3.5–5.1)
Sodium: 132 mmol/L — ABNORMAL LOW (ref 135–145)

## 2024-03-09 LAB — COOXEMETRY PANEL
Carboxyhemoglobin: 1.3 % (ref 0.5–1.5)
Methemoglobin: <0.7 % (ref 0.0–1.5)
O2 Saturation: 69.8 %
Total hemoglobin: 10.7 g/dL — ABNORMAL LOW (ref 12.0–16.0)

## 2024-03-09 LAB — MAGNESIUM: Magnesium: 2 mg/dL (ref 1.7–2.4)

## 2024-03-09 LAB — LACTATE DEHYDROGENASE: LDH: 551 U/L — ABNORMAL HIGH (ref 105–235)

## 2024-03-09 LAB — PROTIME-INR
INR: 1.2 (ref 0.8–1.2)
Prothrombin Time: 15.6 s — ABNORMAL HIGH (ref 11.4–15.2)

## 2024-03-09 MED ORDER — INSULIN GLARGINE-YFGN 100 UNIT/ML ~~LOC~~ SOLN: 5.0000 [IU] | Freq: Every day | SUBCUTANEOUS | Status: DC

## 2024-03-09 MED ORDER — POTASSIUM CHLORIDE CRYS ER 20 MEQ PO TBCR
40.0000 meq | EXTENDED_RELEASE_TABLET | Freq: Once | ORAL | Status: DC
  Filled 2024-03-09: qty 2

## 2024-03-09 MED ORDER — CALCIUM GLUCONATE-NACL 1-0.675 GM/50ML-% IV SOLN
1.0000 g | Freq: Once | INTRAVENOUS | Status: AC
  Administered 2024-03-09: 1000 mg via INTRAVENOUS
  Filled 2024-03-09: qty 50

## 2024-03-09 MED ORDER — ASPIRIN 81 MG PO TBEC
81.0000 mg | DELAYED_RELEASE_TABLET | Freq: Every day | ORAL | Status: DC
  Administered 2024-03-10 – 2024-03-17 (×8): 81 mg via ORAL
  Filled 2024-03-09 (×8): qty 1

## 2024-03-09 MED ORDER — SENNOSIDES-DOCUSATE SODIUM 8.6-50 MG PO TABS
2.0000 | ORAL_TABLET | Freq: Every day | ORAL | Status: DC
  Administered 2024-03-09 – 2024-03-15 (×7): 2 via ORAL
  Filled 2024-03-09 (×8): qty 2

## 2024-03-09 MED ORDER — OXIDIZED CELLULOSE EX PADS
1.0000 | MEDICATED_PAD | Freq: Once | CUTANEOUS | Status: AC
  Administered 2024-03-09: 1 via TOPICAL
  Filled 2024-03-09: qty 1

## 2024-03-09 MED ORDER — POTASSIUM CHLORIDE CRYS ER 20 MEQ PO TBCR
40.0000 meq | EXTENDED_RELEASE_TABLET | ORAL | Status: AC
  Administered 2024-03-09 (×2): 40 meq via ORAL
  Filled 2024-03-09: qty 2

## 2024-03-09 MED ORDER — FUROSEMIDE 10 MG/ML IJ SOLN
40.0000 mg | Freq: Once | INTRAMUSCULAR | Status: AC
  Administered 2024-03-09: 40 mg via INTRAVENOUS
  Filled 2024-03-09: qty 4

## 2024-03-09 MED ORDER — POLYETHYLENE GLYCOL 3350 17 G PO PACK
17.0000 g | PACK | Freq: Every day | ORAL | Status: DC
  Administered 2024-03-11 – 2024-03-16 (×2): 17 g via ORAL
  Filled 2024-03-09 (×4): qty 1

## 2024-03-09 NOTE — Progress Notes (Signed)
 NAME:  Timothy Nicholson, MRN:  [id number], DOB:  09-23-1950, LOS: 6 ADMISSION DATE:  03/03/2024, CONSULTATION DATE: 03/03/2024 REFERRING MD: Dr. Ezra, CHIEF COMPLAINT: Chest pain  History of Present Illness:  73 year old male with hypertension, hyperlipidemia, BPH, CKD 3a and prediabetes who presented as a code STEMI, after he developed chest pain while working at home, he was emergently taken to Cath Lab, underwent LHC.  During procedure he was hypotensive requiring Levophed  infusion, vomited and aspirated requiring nonrebreather facemask.  His LVEDP was 30 with wedge pressure of 24, CPAP was placed after that he went into brief VT/VF and was shocked.  Patient was noted to have occluded proximal LAD, underwent PCI and DES. Post procedure complicated by cardiogenic shock with Impella CP placement  Pertinent  Medical History   Past Medical History:  Diagnosis Date   Arthritis    osteo   Back pain    Benign prostatic hyperplasia with urinary obstruction and other lower urinary tract symptoms    Cancer (HCC)    maligant polyp   Dyslipidemia    Dysplastic nevus 08/13/2006   L chest pectoral - mod, excision 06.10.2008   Dysplastic nevus 09/29/2006   L groin - mild   Elevated PSA    Enlarged prostate    Family history of breast cancer    Family history of colon cancer    Family history of lung cancer    Family history of pancreatic cancer    Family history of prostate cancer    GERD (gastroesophageal reflux disease)    Headache    migraine without aura   HOH (hard of hearing)    Hypercholesterolemia    Hypertension    Personal history of colon cancer 01/19/2018   Scoliosis      Significant Hospital Events: Including procedures, antibiotic start and stop dates in addition to other pertinent events   11/20: STEMI with PCI to LAD, Impella CP placement; RP bleed noted 11/24 CBI for hematuria 11/25 5-5 impella, cystoscopy and fulguration of prostate; left intubated  Interim History  / Subjective:  Ongoing pressor need  sodium chloride      amiodarone  30 mg/hr (03/09/24 0600)   dexmedetomidine  (PRECEDEX ) IV infusion 0.5 mcg/kg/hr (03/09/24 0600)   DOBUTamine  4 mcg/kg/min (03/09/24 0600)   fentaNYL  infusion INTRAVENOUS 25 mcg/hr (03/09/24 0600)   norepinephrine  (LEVOPHED ) Adult infusion 6 mcg/min (03/09/24 0600)   promethazine  (PHENERGAN ) injection (IM or IVPB) Stopped (03/03/24 2233)   sodium bicarbonate  25 mEq (Impella PURGE) in dextrose  5 % 1000 mL bag     sodium chloride  irrigation     vasopressin  0.03 Units/min (03/09/24 0600)     Objective    Blood pressure (!) 138/102, pulse 62, temperature (!) 97 F (36.1 C), resp. rate (!) 33, height 5' 10 (1.778 m), weight 86.6 kg, SpO2 98%. PAP: (35-55)/(12-29) 37/17 CVP:  [5 mmHg-21 mmHg] 10 mmHg PCWP:  [15 mmHg] 15 mmHg CO:  [4 L/min-7.9 L/min] 4 L/min CI:  [1.93 L/min/m2-4 L/min/m2] 1.93 L/min/m2  Vent Mode: PRVC FiO2 (%):  [40 %-100 %] 40 % Set Rate:  [16 bmp] 16 bmp Vt Set:  [580 mL] 580 mL PEEP:  [5 cmH20-8 cmH20] 5 cmH20 Plateau Pressure:  [15 cmH20-20 cmH20] 15 cmH20   Intake/Output Summary (Last 24 hours) at 03/09/2024 0631 Last data filed at 03/09/2024 0600 Gross per 24 hour  Intake 17398.45 ml  Output 82365 ml  Net -235.55 ml   Filed Weights   03/07/24 0500 03/08/24 0600 03/09/24 0500  Weight:  88.3 kg 88.7 kg 86.6 kg    Examination: Sedated on vent Some hematoma around impella packed R groin previous CP site also bruised but soft Ext warm, trace edema Abd soft Lungs sound okay Urine   Plts improved Cr stable CXR looks wet query developing R effusion  Resolved problem list   Assessment and Plan  Acute anterior STEMI status post PCI with DES to LAD 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 - Incidental finding, outpatient follow up needed ICU delirium   - Unasyn , 5 days stop date in - Continue DAPT - AC challenge either later today or tomorrow - Wean down CBI, goal light pink, d/w RN; q2h bladder scan given difficulty tracking I/O - Pressors for MAP 65 - Diuretics, intropes, impella (bicarb purge) per AHF; suspect Lamica need to push fluid removal if hemodynamics tolerate - Reorient PRN, encourage day/night cycles and family visitation, minimize nightly disruptions - Family updated 11/25 - Wean to extubate, [place] US  right chest and consider pleural tube if sign accumulation  34 minutes CC time Rolan Sharps MD PCCM

## 2024-03-09 NOTE — Plan of Care (Signed)
  Problem: Clinical Measurements: Goal: Respiratory complications will improve Outcome: Progressing   Problem: Clinical Measurements: Goal: Cardiovascular complication will be avoided Outcome: Progressing   Problem: Clinical Measurements: Goal: Diagnostic test results will improve Outcome: Progressing   Problem: Elimination: Goal: Will not experience complications related to bowel motility Outcome: Progressing

## 2024-03-09 NOTE — Progress Notes (Signed)
 Urology Inpatient Progress Report  STEMI involving left anterior descending coronary artery (HCC) [I21.02]  Procedure(s): INSERTION, CARDIAC ASSIST DEVICE, IMPELLA ECHOCARDIOGRAM, TRANSESOPHAGEAL, INTRAOPERATIVE CYSTOSCOPY CLOT EVACUATION AND FULGURATION  1 Day Post-Op   Intv/Subj: Patient extubated and working with PT on evening rounds. Urine light pink on slow drip cbi. Hgb stable. Plts 92.  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) Active Problems:   Cardiogenic shock (HCC)  Current Facility-Administered Medications  Medication Dose Route Frequency Provider Last Rate Last Admin   acetaminophen  (TYLENOL ) tablet 650 mg  650 mg Oral Q4H PRN Cooper, Michael, MD   650 mg at 03/07/24 1208   amiodarone  (NEXTERONE  PREMIX) 360-4.14 MG/200ML-% (1.8 mg/mL) IV infusion  30 mg/hr Intravenous Continuous Bensimhon, Toribio SAUNDERS, MD 16.67 mL/hr at 03/09/24 2200 30 mg/hr at 03/09/24 2200   [START ON 03/10/2024] aspirin  EC tablet 81 mg  81 mg Oral Daily McLean, Dalton S, MD       Chlorhexidine  Gluconate Cloth 2 % PADS 6 each  6 each Topical Daily McLean, Dalton S, MD   6 each at 03/09/24 0932   DOBUTamine  (DOBUTREX ) infusion 4000 mcg/mL  4 mcg/kg/min (Order-Specific) Intravenous Continuous Rolan Ezra RAMAN, MD 4.76 mL/hr at 03/09/24 2200 4 mcg/kg/min at 03/09/24 2200   feeding supplement (ENSURE PLUS HIGH PROTEIN) liquid 237 mL  237 mL Oral TID BM McLean, Dalton S, MD   237 mL at 03/09/24 1315   finasteride  (PROSCAR ) tablet 5 mg  5 mg Oral Daily Colletta Manuelita Garre, PA-C   5 mg at 03/09/24 9068   HYDROmorphone  (DILAUDID ) injection 0.5 mg  0.5 mg Intravenous Q4H PRN Gretta Doffing P, DO       insulin  aspart (novoLOG ) injection 0-15 Units  0-15 Units Subcutaneous Q4H Hicks, Samantha B, NP   2 Units at 03/09/24 0447   melatonin tablet 3 mg  3 mg Oral QHS PRN Debarah Donley SAILOR, MD   3 mg at 03/04/24 2035   norepinephrine  (LEVOPHED ) 4mg  in (0.016 mg/mL) premix  infusion  0-40 mcg/min Intravenous Titrated Bensimhon, Toribio SAUNDERS, MD   Stopped at 03/09/24 2013   ondansetron  (ZOFRAN ) injection 4 mg  4 mg Intravenous Q6H PRN Colletta Manuelita Garre, PA-C   4 mg at 03/07/24 9192   Oral care mouth rinse  15 mL Mouth Rinse PRN McLean, Dalton S, MD       Oral care mouth rinse  15 mL Mouth Rinse PRN Bensimhon, Toribio SAUNDERS, MD       oxyCODONE  (Oxy IR/ROXICODONE ) immediate release tablet 5-10 mg  5-10 mg Oral Q4H PRN Cooper, Michael, MD       pantoprazole  (PROTONIX ) EC tablet 40 mg  40 mg Oral Daily Colletta Manuelita Garre, PA-C   40 mg at 03/09/24 0932   polyethylene glycol (MIRALAX  / GLYCOLAX ) packet 17 g  17 g Oral Daily Bensimhon, Toribio SAUNDERS, MD       promethazine  (PHENERGAN ) 12.5 mg in sodium chloride  0.9 % 50 mL IVPB  12.5 mg Intravenous Q6H PRN McLean, Dalton S, MD   Paused at 03/03/24 2233   QUEtiapine  (SEROQUEL ) tablet 25 mg  25 mg Oral QHS Claudene Toribio BROCKS, MD   25 mg at 03/09/24 2106   rosuvastatin  (CRESTOR ) tablet 20 mg  20 mg Oral Daily Cooper, Michael, MD   20 mg at 03/09/24 9068   senna-docusate (Senokot-S) tablet 2 tablet  2 tablet Oral QHS Bensimhon, Daniel R, MD   2 tablet at 03/09/24 2106   simethicone  (  MYLICON) chewable tablet 80 mg  80 mg Oral QID PRN Claudene Toribio BROCKS, MD   80 mg at 03/07/24 1610   sodium bicarbonate  25 mEq (Impella PURGE) in dextrose  5 % 1000 mL bag   Intracatheter Continuous Cooper, Michael, MD   New Bag at 03/06/24 0238   sodium chloride  flush (NS) 0.9 % injection 3 mL  3 mL Intravenous Q12H Wonda Sharper, MD   3 mL at 03/09/24 2107   sodium chloride  flush (NS) 0.9 % injection 3 mL  3 mL Intravenous PRN Wonda Sharper, MD       sodium chloride  irrigation 0.9 % 3,000 mL  3,000 mL Irrigation Continuous Smith, Daniel C, MD   3,000 mL at 03/09/24 0421   tamsulosin  (FLOMAX ) capsule 0.4 mg  0.4 mg Oral Daily Colletta Manuelita Garre, PA-C   0.4 mg at 03/09/24 0932   ticagrelor  (BRILINTA ) tablet 90 mg  90 mg Oral BID McLean, Dalton S, MD   90 mg at  03/09/24 2106   traZODone  (DESYREL ) tablet 50 mg  50 mg Oral QHS Claudene Toribio BROCKS, MD   50 mg at 03/09/24 2106   vasopressin  (PITRESSIN) 20 Units in 100 mL (0.2 unit/mL) infusion-*FOR SHOCK*  0-0.03 Units/min Intravenous Continuous Bensimhon, Toribio SAUNDERS, MD 9 mL/hr at 03/09/24 2200 0.03 Units/min at 03/09/24 2200     Objective: Vital: Vitals:   03/09/24 2130 03/09/24 2145 03/09/24 2200 03/09/24 2215  BP:      Pulse: 88 87 80 86  Resp: (!) 40 (!) 24 (!) 24 (!) 26  Temp: 99.5 F (37.5 C) 99.5 F (37.5 C) 99.5 F (37.5 C) 99.5 F (37.5 C)  TempSrc:      SpO2: 94% 94% 93% 93%  Weight:      Height:       I/Os: I/O last 3 completed shifts: In: 23923.1 [I.V.:1791.9; Blood:1683.8; Nuyzm:79912.4; NG/GT:90; IV Piggyback:270] Out: 75745 [Urine:23940; Emesis/NG output:260; Drains:54]  Physical Exam:  General: Patient is in no apparent distress Lungs: Normal respiratory effort, chest expands symmetrically. GI: The abdomen is soft and nontender without mass. Foley: 3 way foley draining pink tinge on slow drip cbi  Ext: lower extremities symmetric  Lab Results: Recent Labs    03/08/24 0440 03/08/24 1315 03/08/24 2210 03/09/24 0438 03/09/24 0450  WBC 7.7  --  9.4  --  8.4  HGB 7.4*   < > 10.6* 9.9* 10.7*  HCT 21.2*   < > 30.4* 29.0* 30.7*   < > = values in this interval not displayed.   Recent Labs    03/07/24 1408 03/08/24 0440 03/08/24 1315 03/08/24 1318 03/08/24 1414 03/08/24 1519 03/08/24 1837 03/09/24 0438 03/09/24 0450  NA 130* 131* 131*   < > 130*   < > 130* 131* 132*  K 3.7 3.6 4.5   < > 4.0   < > 4.8 3.7 3.6  CL 99 99 98  --  98  --   --   --  100  CO2 21* 22  --   --   --   --   --   --  22  GLUCOSE 141* 152* 106*  --  114*  --   --   --  139*  BUN 37* 39* 52*  --  38*  --   --   --  38*  CREATININE 1.58* 1.61* 1.60*  --  1.50*  --   --   --  1.66*  CALCIUM  7.4* 7.4*  --   --   --   --   --   --  7.4*   < > = values in this interval not displayed.   Recent  Labs    03/08/24 0750 03/09/24 0450  INR 1.1 1.2   No results for input(s): LABURIN in the last 72 hours. Results for orders placed or performed during the hospital encounter of 03/03/24  MRSA Next Gen by PCR, Nasal     Status: None   Collection Time: 03/03/24  9:37 PM   Specimen: Nasal Mucosa; Nasal Swab  Result Value Ref Range Status   MRSA by PCR Next Gen NOT DETECTED NOT DETECTED Final    Comment: (NOTE) The GeneXpert MRSA Assay (FDA approved for NASAL specimens only), is one component of a comprehensive MRSA colonization surveillance program. It is not intended to diagnose MRSA infection nor to guide or monitor treatment for MRSA infections. Test performance is not FDA approved in patients less than 110 years old. Performed at [facility], [address], [place], [place] [address]     Studies/Results: DG CHEST PORT 1 VIEW Result Date: 03/09/2024 EXAM: 1 VIEW(S) XRAY OF THE CHEST 03/09/2024 05:41:00 AM COMPARISON: 03/08/2024 CLINICAL HISTORY: Ventilator dependence (HCC) FINDINGS: LINES, TUBES AND DEVICES: Endotracheal tube in place with tip 4 cm above the carina. Enteric tube in place, coursing below the diaphragm with distal tip beyond the inferior margin of the film. Right IJ pulmonary arterial catheter in place, terminating within the right main pulmonary artery. Impella device in place with distal aspect projecting over the right ventricle. LUNGS AND PLEURA: Hazy lower lobe opacities. Bilateral layering pleural effusions. No pneumothorax. HEART AND MEDIASTINUM: No acute abnormality of the cardiac and mediastinal silhouettes. Aortic atherosclerosis. BONES AND SOFT TISSUES: No acute osseous abnormality. IMPRESSION: 1. Bilateral layering pleural effusions with associated lower lobe opacities, compatible with atelectasis versus edema. 2. Endotracheal tube, enteric tube, right IJ pulmonary arterial catheter, and Impella device in expected positions. Electronically signed by:  Katheleen Faes MD 03/09/2024 09:52 AM EST RP Workstation: [id number]   DG Chest Port 1 View Result Date: 03/08/2024 EXAM: 1 VIEW(S) XRAY OF THE CHEST 03/08/2024 06:31:40 PM COMPARISON: 03/06/2024, 03/05/2024 CLINICAL HISTORY: History of ETT [id number] FINDINGS: LINES, TUBES AND DEVICES: Endotracheal tube in place with tip 3 cm above the carina. Enteric tube in place coursing below the hemidiaphragm with tip and side port overlying the expected region of the gastric lumen. Right internal jugular Swan-Ganz catheter in place with tip overlying the expected region of the right main pulmonary artery. Removal of previously noted ascending approach impella device. Placement of right axillary impella device with tip overlying expected location of the left ventricle. LUNGS AND PLEURA: Interval increase in diffuse hazy opacities of the right lung. Bilateral pleural effusions, left greater than right. Mild pulmonary edema. No pneumothorax. Worsened left basilar consolidation. HEART AND MEDIASTINUM: Atherosclerotic plaque noted. Stable cardiomediastinal silhouette. BONES AND SOFT TISSUES: No acute osseous abnormality. IMPRESSION: 1. Support lines and tubes as detailed above. 2. Stable cardiomediastinal silhouette with worsened vascular congestion and diffuse pulmonary edema compared to prior. Suspect increased pleural effusions. Worsened consolidation at the left base, probable atelectasis. Electronically signed by: Luke Bun MD 03/08/2024 07:02 PM EST RP Workstation: [id number]   DG C-Arm 1-60 Min-No Report Result Date: 03/08/2024 Fluoroscopy was utilized by the requesting physician.  No radiographic interpretation.    Assessment: Gross hematuria secondary to BPH in setting of DAPT and thrombocytopenia Right retroperitoneal hematoma--stable  Right angiomyolipoma  Procedure(s): INSERTION, CARDIAC ASSIST DEVICE, IMPELLA ECHOCARDIOGRAM, TRANSESOPHAGEAL, INTRAOPERATIVE CYSTOSCOPY CLOT EVACUATION AND FULGURATION, 1  Day  Post-Op  doing well.  Plan: Continue to wean CBI. Hopefully able to wean off in a day or 2 and undergo voiding trial. If significant bleeding reoccurs can consider prostate embolization     Sherwood Edison, MD Urology 03/09/2024, 10:26 PM

## 2024-03-09 NOTE — TOC Progression Note (Signed)
 Transition of Care [facility]) - Progression Note    Patient Details  Name: Timothy Nicholson MRN: [id number] Date of Birth: 08/02/1950  Transition of Care [facility] CM/SW Contact  Arlana JINNY Nicholaus [place] Phone Number: [phone] 03/09/2024, 11:49 AM  Clinical Narrative:   Per chart review patient is not medically ready for dc.   HF CSW/CM will continue to follow and monitor for dc readiness.     Expected Discharge Plan: Home/Self Care Barriers to Discharge: Continued Medical Work up               Expected Discharge Plan and Services   Discharge Planning Services: CM Consult   Living arrangements for the past 2 months: Single Family Home                                       Social Drivers of Health (SDOH) Interventions SDOH Screenings   Food Insecurity: No Food Insecurity (03/04/2024)  Housing: Low Risk  (03/04/2024)  Transportation Needs: No Transportation Needs (03/04/2024)  Utilities: Not At Risk (03/04/2024)  Financial Resource Strain: Low Risk  (08/04/2023)   Received from [facility]  Tobacco Use: Low Risk  (03/03/2024)    Readmission Risk Interventions     No data to display

## 2024-03-09 NOTE — Progress Notes (Addendum)
 Patient ID: Timothy Nicholson, male   DOB: [birth year], 73 y.o.   MRN: [id number]     Advanced Heart Failure Rounding Note  Cardiologist: None  Chief Complaint: AMI CS   Patient Profile:    73 y/o male admitted w/ acute anterior STEMI c/b CS requiring Impella CP support, further c/b + RPB/hemorrhagic shock. Given ongoing support needs and inability to wean off chemical inotrope/pressor support, was upgraded to Impella 5.5 on 11/25.   Subjective:    POD #1, s/p Impella 5.5  Intubated. Now off sedation and following commands.   Impella P-7, Flow 3.8L. No alarms.   On DBA 4, VP 0.03 + NE currently at 3 (down from 7, RN able to wean now off sedation). MAP 82  Hemodynamics CVP 9 PAP 39/17 TD CI 2.32, FICK CI 2.7  CO 4.81 Co-ox 69%  PAPI 2.4 SVR 1214  Oozy from Impella access site. Plts improving, 63>>92K today. Continues w/ gross hematuria. Still w/ CBI. Hgb 10.7    Objective:   Weight Range: 86.6 kg Body mass index is 27.39 kg/m.   Vital Signs:   Temp:  [96.4 F (35.8 C)-99.3 F (37.4 C)] 97.5 F (36.4 C) (11/26 0730) Pulse Rate:  [62-113] 70 (11/26 0730) Resp:  [0-35] 26 (11/26 0730) BP: (108-142)/(59-103) 130/103 (11/26 0700) SpO2:  [91 %-100 %] 98 % (11/26 0730) Arterial Line BP: (80-284)/(42-281) 115/72 (11/26 0730) FiO2 (%):  [40 %-100 %] 40 % (11/26 0428) Weight:  [86.6 kg] 86.6 kg (11/26 0500) Last BM Date : 03/08/24  Weight change: Filed Weights   03/07/24 0500 03/08/24 0600 03/09/24 0500  Weight: 88.3 kg 88.7 kg 86.6 kg    Intake/Output:   Intake/Output Summary (Last 24 hours) at 03/09/2024 0734 Last data filed at 03/09/2024 0700 Gross per 24 hour  Intake 17342.92 ml  Output 82665 ml  Net 8.92 ml      Physical Exam   CVP 9  GENERAL: intubated, calm, follows commands, Rt axillary Impella site oozy   Lungs- intubated and clar  CARDIAC:  right internal jugular Swan, JVP 9-10 cm          Normal rate with regular rhythm.  No MRG. 1+ b/l LEE   ABDOMEN: Soft, non-tender, non-distended.  EXTREMITIES: Warm and well perfused.  NEUROLOGIC: intubated but follow commands GU+ Foley w/ pink tinged urine    Telemetry   NSR 60s, Personally reviewed  Labs    CBC Recent Labs    03/08/24 2210 03/09/24 0438 03/09/24 0450  WBC 9.4  --  8.4  HGB 10.6* 9.9* 10.7*  HCT 30.4* 29.0* 30.7*  MCV 88.4  --  88.0  PLT 86*  --  92*   Basic Metabolic Panel Recent Labs    88/74/74 0440 03/08/24 1315 03/08/24 1414 03/08/24 1519 03/09/24 0438 03/09/24 0450  NA 131*   < > 130*   < > 131* 132*  K 3.6   < > 4.0   < > 3.7 3.6  CL 99   < > 98  --   --  100  CO2 22  --   --   --   --  22  GLUCOSE 152*   < > 114*  --   --  139*  BUN 39*   < > 38*  --   --  38*  CREATININE 1.61*   < > 1.50*  --   --  1.66*  CALCIUM  7.4*  --   --   --   --  7.4*  MG 2.0  --   --   --   --  2.0   < > = values in this interval not displayed.   Liver Function Tests Recent Labs    03/07/24 0420 03/09/24 0450  AST 55* 35  ALT 33 33  ALKPHOS 32* 42  BILITOT 1.0 1.5*  PROT 5.1* 5.1*  ALBUMIN  2.5* 2.4*   No results for input(s): LIPASE, AMYLASE in the last 72 hours. Cardiac Enzymes No results for input(s): CKTOTAL, CKMB, CKMBINDEX, TROPONINI in the last 72 hours.  BNP: BNP (last 3 results) No results for input(s): BNP in the last 8760 hours.  ProBNP (last 3 results) No results for input(s): PROBNP in the last 8760 hours.   D-Dimer No results for input(s): DDIMER in the last 72 hours. Hemoglobin A1C No results for input(s): HGBA1C in the last 72 hours.  Fasting Lipid Panel No results for input(s): CHOL, HDL, LDLCALC, TRIG, CHOLHDL, LDLDIRECT in the last 72 hours.  Thyroid Function Tests No results for input(s): TSH, T4TOTAL, T3FREE, THYROIDAB in the last 72 hours.  Invalid input(s): FREET3  Other results:   Imaging    DG Chest Port 1 View Result Date: 03/08/2024 EXAM: 1 VIEW(S) XRAY OF THE  CHEST 03/08/2024 06:31:40 PM COMPARISON: 03/06/2024, 03/05/2024 CLINICAL HISTORY: History of ETT [id number] FINDINGS: LINES, TUBES AND DEVICES: Endotracheal tube in place with tip 3 cm above the carina. Enteric tube in place coursing below the hemidiaphragm with tip and side port overlying the expected region of the gastric lumen. Right internal jugular Swan-Ganz catheter in place with tip overlying the expected region of the right main pulmonary artery. Removal of previously noted ascending approach impella device. Placement of right axillary impella device with tip overlying expected location of the left ventricle. LUNGS AND PLEURA: Interval increase in diffuse hazy opacities of the right lung. Bilateral pleural effusions, left greater than right. Mild pulmonary edema. No pneumothorax. Worsened left basilar consolidation. HEART AND MEDIASTINUM: Atherosclerotic plaque noted. Stable cardiomediastinal silhouette. BONES AND SOFT TISSUES: No acute osseous abnormality. IMPRESSION: 1. Support lines and tubes as detailed above. 2. Stable cardiomediastinal silhouette with worsened vascular congestion and diffuse pulmonary edema compared to prior. Suspect increased pleural effusions. Worsened consolidation at the left base, probable atelectasis. Electronically signed by: Luke Bun MD 03/08/2024 07:02 PM EST RP Workstation: [id number]   DG C-Arm 1-60 Min-No Report Result Date: 03/08/2024 Fluoroscopy was utilized by the requesting physician.  No radiographic interpretation.       Medications:     Scheduled Medications:  sodium chloride    Intravenous Once   sodium chloride    Intravenous Once   sodium chloride    Intravenous Once   aspirin   81 mg Oral Daily   Chlorhexidine  Gluconate Cloth  6 each Topical Daily   docusate  100 mg Per Tube BID   feeding supplement  237 mL Oral TID BM   fentaNYL  (SUBLIMAZE ) injection  25-50 mcg Intravenous Once   finasteride   5 mg Oral Daily   insulin  aspart  0-15 Units  Subcutaneous Q4H   insulin  glargine-yfgn  5 Units Subcutaneous Daily   mouth rinse  15 mL Mouth Rinse 4 times per day   mouth rinse  15 mL Mouth Rinse Q2H   oxidized cellulose  1 each Topical Once   pantoprazole   40 mg Oral Daily   polyethylene glycol  17 g Per Tube Daily   QUEtiapine   25 mg Oral QHS   rosuvastatin   20 mg Oral Daily  sodium chloride  flush  3 mL Intravenous Q12H   tamsulosin   0.4 mg Oral Daily   ticagrelor   90 mg Oral BID   traZODone   50 mg Oral QHS    Infusions:  sodium chloride      amiodarone  30 mg/hr (03/09/24 0600)   dexmedetomidine  (PRECEDEX ) IV infusion 0.5 mcg/kg/hr (03/09/24 0600)   DOBUTamine  4 mcg/kg/min (03/09/24 0600)   fentaNYL  infusion INTRAVENOUS 25 mcg/hr (03/09/24 0600)   norepinephrine  (LEVOPHED ) Adult infusion 6 mcg/min (03/09/24 0600)   promethazine  (PHENERGAN ) injection (IM or IVPB) Stopped (03/03/24 2233)   sodium bicarbonate  25 mEq (Impella PURGE) in dextrose  5 % 1000 mL bag     sodium chloride  irrigation     vasopressin  0.03 Units/min (03/09/24 0600)    PRN Medications: acetaminophen , fentaNYL , HYDROmorphone  (DILAUDID ) injection, melatonin, midazolam  PF, ondansetron  (ZOFRAN ) IV, mouth rinse, mouth rinse, oxyCODONE , promethazine  (PHENERGAN ) injection (IM or IVPB), simethicone , sodium chloride  flush     Assessment/Plan   1. CAD: Anterior STEMI with occluded proximal LAD; the RCA was not selectively engaged but appeared to be relatively small with proximal occlusion. Now s/p DES to proximal LAD and PTCA to ostial D1.  - stable no s/s angina - continue ASA/statin/Brilinta  - management of post MI shock as below 2. Cardiogenic shock: Due to anterior MI.  Limited echo with Impella CP showed EF 25% with LAD territory akinesis, normal RV size/mildly decreased systolic function. Now w/ Impella 5.5 to give extended time for recovery vs bridge to durable LVAD. Currently on P-7, Flow 3.8L + DBA 4, NE 3 and VP 0.03, Co-ox 69%, FICK CI 2.7, CVP 9, PAP  39/17    - Wean NE and VP as tolerated, MAP Goal ~70 - d/w CCM. Work to wean towards extubation today - repeat IV Lasix  40 mg x 1  3. AKI on CKD stage 3: Creatinine 1.6 => 2.1 -> 2.3 -> 2.0->1.69->1.61>1.7 with cardiogenic/hemorrhagic shock. Also had contrast loads with cath and CTA.   - Follow closely, support CO per above   4. VT: Peri-STEMI. - VT quiescent  - Continue amiodarone  gtt 30 mg/hr for now  5. Heme: Hemorrhagic component to shock with hematoma at Impella access site and RP hematoma + hematuria.  CTA showed RP hematoma without active extravasation.  - No heparin  as above (HCO3 purge).  - hgb 10.7 today. Transfuse Hgb < 8.  - follow CBC  6. Pulmonary: Possible aspiration PNA.  - completed 5 day course of Unasyn .  7. Hematuria - likely local trauma.  - CBI  8. Delirium - Management per CCM  9. Thrombocytopenia - trending back up, 63>>92K today  - follow CBC  10. Illeus: Abd x-ray 11/24 w/ large stool burden. Had large BM yesterday w/ bowel regimen - repeat abx x-ray pending    CRITICAL CARE Performed by: Caffie Shed   Total critical care time: 15 minutes  Critical care time was exclusive of separately billable procedures and treating other patients.  Critical care was necessary to treat or prevent imminent or life-threatening deterioration.  Critical care was time spent personally by me on the following activities: development of treatment plan with patient and/or surrogate as well as nursing, discussions with consultants, evaluation of patient's response to treatment, examination of patient, obtaining history from patient or surrogate, ordering and performing treatments and interventions, ordering and review of laboratory studies, ordering and review of radiographic studies, pulse oximetry and re-evaluation of patient's condition.    Length of Stay: [address] [place]  03/09/2024, 7:34  AM  Advanced Heart Failure Team Pager [phone] (M-F; 7a - 5p)   Please contact [facility] for night-coverage after hours (5p -7a ) and weekends on [email or web address]    Agree with above.   Extubated this am. Remains on Impella 5.5. Weaning NE. On DBA 4 and VP 0.03    Hemodynamics stable.   Impella at P-7 Flow 3.8L waveforms ok   CXR with mild edema. RLL infiltrate/effusion   Hematuria improved  General:  Sitting up in bed. No resp difficulty HEENT: normal Neck: supple. RIJ swan Cor: Regular rate & rhythm. Impella site with mild oozing  Lungs: coarse Abdomen: soft, nontender, nondistended.Good bowel sounds. Extremities: no cyanosis, clubbing, rash, edema  R groin ecchymosis no hematoma Neuro: alert & orientedx3, cranial nerves grossly intact. moves all 4 extremities w/o difficulty. Affect pleasant  Hemodynamics improved with Impella 5.5. VAD interrogated personally. Parameters stable.  Continue to wean NE as tolerated.   Lasix  40 IV x q  CCM to u/s R lung  Ambulate.   CRITICAL CARE Performed by: Cherrie Sieving  Total critical care time: 42 minutes  Critical care time was exclusive of separately billable procedures and treating other patients.  Critical care was necessary to treat or prevent imminent or life-threatening deterioration.  Critical care was time spent personally by me (independent of midlevel providers or residents) on the following activities: development of treatment plan with patient and/or surrogate as well as nursing, discussions with consultants, evaluation of patient's response to treatment, examination of patient, obtaining history from patient or surrogate, ordering and performing treatments and interventions, ordering and review of laboratory studies, ordering and review of radiographic studies, pulse oximetry and re-evaluation of patient's condition.  Sieving Cherrie, MD  2:27 PM

## 2024-03-09 NOTE — Discharge Instructions (Signed)

## 2024-03-09 NOTE — Plan of Care (Signed)
  Problem: Education: Goal: Knowledge of General Education information will improve Description: Including pain rating scale, medication(s)/side effects and non-pharmacologic comfort measures Outcome: Progressing   Problem: Health Behavior/Discharge Planning: Goal: Ability to manage health-related needs will improve Outcome: Progressing   Problem: Clinical Measurements: Goal: Ability to maintain clinical measurements within normal limits will improve Outcome: Progressing Goal: Will remain free from infection Outcome: Progressing Goal: Diagnostic test results will improve Outcome: Progressing Goal: Respiratory complications will improve Outcome: Progressing Goal: Cardiovascular complication will be avoided Outcome: Progressing   Problem: Activity: Goal: Risk for activity intolerance will decrease Outcome: Progressing   Problem: Nutrition: Goal: Adequate nutrition will be maintained Outcome: Progressing   Problem: Coping: Goal: Level of anxiety will decrease Outcome: Progressing   Problem: Elimination: Goal: Will not experience complications related to bowel motility Outcome: Progressing Goal: Will not experience complications related to urinary retention Outcome: Progressing   Problem: Pain Managment: Goal: General experience of comfort will improve and/or be controlled Outcome: Progressing   Problem: Safety: Goal: Ability to remain free from injury will improve Outcome: Progressing   Problem: Skin Integrity: Goal: Risk for impaired skin integrity will decrease Outcome: Progressing   Problem: Education: Goal: Understanding of CV disease, CV risk reduction, and recovery process will improve Outcome: Progressing   Problem: Activity: Goal: Ability to return to baseline activity level will improve Outcome: Progressing   Problem: Cardiovascular: Goal: Ability to achieve and maintain adequate cardiovascular perfusion will improve Outcome: Progressing Goal:  Vascular access site(s) Level 0-1 will be maintained Outcome: Progressing   Problem: Health Behavior/Discharge Planning: Goal: Ability to safely manage health-related needs after discharge will improve Outcome: Progressing   Problem: Education: Goal: Ability to describe self-care measures that may prevent or decrease complications (Diabetes Survival Skills Education) will improve Outcome: Progressing   Problem: Coping: Goal: Ability to adjust to condition or change in health will improve Outcome: Progressing   Problem: Fluid Volume: Goal: Ability to maintain a balanced intake and output will improve Outcome: Progressing   Problem: Health Behavior/Discharge Planning: Goal: Ability to identify and utilize available resources and services will improve Outcome: Progressing Goal: Ability to manage health-related needs will improve Outcome: Progressing   Problem: Metabolic: Goal: Ability to maintain appropriate glucose levels will improve Outcome: Progressing   Problem: Nutritional: Goal: Maintenance of adequate nutrition will improve Outcome: Progressing Goal: Progress toward achieving an optimal weight will improve Outcome: Progressing   Problem: Skin Integrity: Goal: Risk for impaired skin integrity will decrease Outcome: Progressing   Problem: Tissue Perfusion: Goal: Adequacy of tissue perfusion will improve Outcome: Progressing

## 2024-03-09 NOTE — Progress Notes (Signed)
 1 Day Post-Op Procedure(s) (LRB): INSERTION, CARDIAC ASSIST DEVICE, IMPELLA (N/A) ECHOCARDIOGRAM, TRANSESOPHAGEAL, INTRAOPERATIVE (N/A) CYSTOSCOPY CLOT EVACUATION AND FULGURATION Subjective: Hemodynamically stable overnight Right groin has been stable. Some oozing from new Impella incision and the dressing was reinforced. Drainage from [facility] drain in wound minimal.  Objective: Vital signs in last 24 hours: Temp:  [96.4 F (35.8 C)-99.3 F (37.4 C)] 97.5 F (36.4 C) (11/26 0730) Pulse Rate:  [62-113] 70 (11/26 0730) Cardiac Rhythm: Normal sinus rhythm (11/26 0000) Resp:  [0-35] 26 (11/26 0730) BP: (108-142)/(59-103) 130/103 (11/26 0700) SpO2:  [91 %-100 %] 98 % (11/26 0730) Arterial Line BP: (80-284)/(42-281) 115/72 (11/26 0730) FiO2 (%):  [40 %-100 %] 40 % (11/26 0428) Weight:  [86.6 kg] 86.6 kg (11/26 0500)  Hemodynamic parameters for last 24 hours: PAP: (33-55)/(11-29) 37/16 CVP:  [5 mmHg-21 mmHg] 5 mmHg PCWP:  [15 mmHg] 15 mmHg CO:  [4 L/min-7.9 L/min] 4 L/min CI:  [1.93 L/min/m2-4 L/min/m2] 1.93 L/min/m2  Intake/Output from previous day: 11/25 0701 - 11/26 0700 In: 17342.9 [I.V.:1363.5; Blood:1683.8; NG/GT:90; IV Piggyback:220.1] Out: 82665 [Urine:17040; Emesis/NG output:250; Drains:44] Intake/Output this shift: No intake/output data recorded.  General appearance: alert and cooperative, intubated on vent Neurologic: intact Heart: regular rate and rhythm, S1, S2 normal, no murmur Lungs: diminished breath sounds bibasilar Wound: Dressing changed. Right axillary incision has some oozing from the skin edge but there is no significant hematoma in the wound.  Right groin looks ok with ecchymosis but no significant hematoma. Lab Results: Recent Labs    03/08/24 2210 03/09/24 0438 03/09/24 0450  WBC 9.4  --  8.4  HGB 10.6* 9.9* 10.7*  HCT 30.4* 29.0* 30.7*  PLT 86*  --  92*   BMET:  Recent Labs    03/08/24 0440 03/08/24 1315 03/08/24 1414 03/08/24 1519  03/09/24 0438 03/09/24 0450  NA 131*   < > 130*   < > 131* 132*  K 3.6   < > 4.0   < > 3.7 3.6  CL 99   < > 98  --   --  100  CO2 22  --   --   --   --  22  GLUCOSE 152*   < > 114*  --   --  139*  BUN 39*   < > 38*  --   --  38*  CREATININE 1.61*   < > 1.50*  --   --  1.66*  CALCIUM  7.4*  --   --   --   --  7.4*   < > = values in this interval not displayed.    PT/INR:  Recent Labs    03/09/24 0450  LABPROT 15.6*  INR 1.2   ABG    Component Value Date/Time   PHART 7.433 03/09/2024 0438   HCO3 25.9 03/09/2024 0438   TCO2 27 03/09/2024 0438   ACIDBASEDEF 1.0 03/08/2024 1519   O2SAT 69.8 03/09/2024 0524   CBG (last 3)  Recent Labs    03/08/24 2010 03/08/24 2349 03/09/24 0350  GLUCAP 147* 143* 133*    Assessment/Plan: S/P Procedure(s) (LRB): INSERTION, CARDIAC ASSIST DEVICE, IMPELLA (N/A) ECHOCARDIOGRAM, TRANSESOPHAGEAL, INTRAOPERATIVE (N/A) CYSTOSCOPY CLOT EVACUATION AND FULGURATION  Impella site looks ok. There was some oozing from the skin edge which stopped with direct pressure. Nurse will put thrombipad on incision beneath gauze dressing. No significant hematoma in wound. I think he is a Brilinta /ASA super responder. Will keep Blake drain in today and probably remove tomorrow.  Other plans per  AHF and CCM teams.  His CXR looks like he has bilateral pleural effusions and I could see them on TEE in the OR, R>L. Traister need thoracentesis but that is up to CCM. Certainly at risk for bleeding from any procedure.   LOS: 6 days    Dorise MARLA Fellers 03/09/2024

## 2024-03-09 NOTE — Procedures (Signed)
 Extubation Procedure Note  Patient Details:   Name: Timothy Nicholson DOB: 03-17-1951 MRN: [id number]   Airway Documentation:    Vent end date: 03/09/24 Vent end time: 0832   Evaluation  O2 sats: stable throughout Complications: No apparent complications Patient did tolerate procedure well. Bilateral Breath Sounds: Diminished   Yes,  Per CCM order, RT extubated pt to [place]. Prior to extubation pt did have a positive cuff leak. Pt tolerated well with SVS, no stridor noted and pt was able to state his name.  Karthikeya Funke R 03/09/2024, 8:32 AM

## 2024-03-10 DIAGNOSIS — I5021 Acute systolic (congestive) heart failure: Secondary | ICD-10-CM | POA: Diagnosis not present

## 2024-03-10 DIAGNOSIS — Z95811 Presence of heart assist device: Secondary | ICD-10-CM | POA: Diagnosis not present

## 2024-03-10 DIAGNOSIS — R570 Cardiogenic shock: Secondary | ICD-10-CM | POA: Diagnosis not present

## 2024-03-10 DIAGNOSIS — I2102 ST elevation (STEMI) myocardial infarction involving left anterior descending coronary artery: Secondary | ICD-10-CM | POA: Diagnosis not present

## 2024-03-10 DIAGNOSIS — J9601 Acute respiratory failure with hypoxia: Secondary | ICD-10-CM | POA: Diagnosis not present

## 2024-03-10 LAB — MAGNESIUM: Magnesium: 1.9 mg/dL (ref 1.7–2.4)

## 2024-03-10 LAB — BASIC METABOLIC PANEL WITH GFR
Anion gap: 9 (ref 5–15)
Anion gap: 12 (ref 5–15)
BUN: 43 mg/dL — ABNORMAL HIGH (ref 8–23)
BUN: 43 mg/dL — ABNORMAL HIGH (ref 8–23)
CO2: 23 mmol/L (ref 22–32)
CO2: 23 mmol/L (ref 22–32)
Calcium: 7.3 mg/dL — ABNORMAL LOW (ref 8.9–10.3)
Calcium: 7.3 mg/dL — ABNORMAL LOW (ref 8.9–10.3)
Chloride: 101 mmol/L (ref 98–111)
Chloride: 98 mmol/L (ref 98–111)
Creatinine, Ser: 1.59 mg/dL — ABNORMAL HIGH (ref 0.61–1.24)
Creatinine, Ser: 1.53 mg/dL — ABNORMAL HIGH (ref 0.61–1.24)
GFR, Estimated: 46 mL/min — ABNORMAL LOW (ref >60)
GFR, Estimated: 48 mL/min — ABNORMAL LOW (ref >60)
Glucose, Bld: 105 mg/dL — ABNORMAL HIGH (ref 70–99)
Glucose, Bld: 112 mg/dL — ABNORMAL HIGH (ref 70–99)
Potassium: 3.2 mmol/L — ABNORMAL LOW (ref 3.5–5.1)
Potassium: 3.5 mmol/L (ref 3.5–5.1)
Sodium: 133 mmol/L — ABNORMAL LOW (ref 135–145)
Sodium: 133 mmol/L — ABNORMAL LOW (ref 135–145)

## 2024-03-10 LAB — CBC
HCT: 27.5 % — ABNORMAL LOW (ref 39.0–52.0)
Hemoglobin: 9.5 g/dL — ABNORMAL LOW (ref 13.0–17.0)
MCH: 30.9 pg (ref 26.0–34.0)
MCHC: 34.5 g/dL (ref 30.0–36.0)
MCV: 89.6 fL (ref 80.0–100.0)
Platelets: 121 K/uL — ABNORMAL LOW (ref 150–400)
RBC: 3.07 MIL/uL — ABNORMAL LOW (ref 4.22–5.81)
RDW: 15.6 % — ABNORMAL HIGH (ref 11.5–15.5)
WBC: 7.5 K/uL (ref 4.0–10.5)
nRBC: 0 % (ref 0.0–0.2)

## 2024-03-10 LAB — GLUCOSE, CAPILLARY
Glucose-Capillary: 115 mg/dL — ABNORMAL HIGH (ref 70–99)
Glucose-Capillary: 111 mg/dL — ABNORMAL HIGH (ref 70–99)
Glucose-Capillary: 103 mg/dL — ABNORMAL HIGH (ref 70–99)
Glucose-Capillary: 100 mg/dL — ABNORMAL HIGH (ref 70–99)

## 2024-03-10 LAB — COOXEMETRY PANEL
Carboxyhemoglobin: 2.1 % — ABNORMAL HIGH (ref 0.5–1.5)
Methemoglobin: <0.7 % (ref 0.0–1.5)
O2 Saturation: 72.7 %
Total hemoglobin: 9.2 g/dL — ABNORMAL LOW (ref 12.0–16.0)

## 2024-03-10 LAB — CALCIUM, IONIZED: Calcium, Ionized, Serum: 4.6 mg/dL (ref 4.5–5.6)

## 2024-03-10 LAB — LACTATE DEHYDROGENASE: LDH: 476 U/L — ABNORMAL HIGH (ref 105–235)

## 2024-03-10 MED ORDER — HEPARIN (PORCINE) 25000 UT/250ML-% IV SOLN: 500.0000 [IU]/h | INTRAVENOUS | Status: DC

## 2024-03-10 MED ORDER — POTASSIUM CHLORIDE CRYS ER 20 MEQ PO TBCR
40.0000 meq | EXTENDED_RELEASE_TABLET | Freq: Once | ORAL | Status: AC
  Administered 2024-03-10: 40 meq via ORAL
  Filled 2024-03-10: qty 2

## 2024-03-10 MED ORDER — POTASSIUM CHLORIDE CRYS ER 20 MEQ PO TBCR
20.0000 meq | EXTENDED_RELEASE_TABLET | ORAL | Status: DC
  Administered 2024-03-10: 20 meq via ORAL
  Filled 2024-03-10: qty 1

## 2024-03-10 MED ORDER — FUROSEMIDE 10 MG/ML IJ SOLN
40.0000 mg | Freq: Once | INTRAMUSCULAR | Status: AC
  Administered 2024-03-10: 40 mg via INTRAVENOUS
  Filled 2024-03-10: qty 4

## 2024-03-10 MED ORDER — MAGNESIUM SULFATE 2 GM/50ML IV SOLN
2.0000 g | Freq: Once | INTRAVENOUS | Status: AC
  Administered 2024-03-10: 2 g via INTRAVENOUS
  Filled 2024-03-10: qty 50

## 2024-03-10 MED ORDER — POTASSIUM CHLORIDE CRYS ER 20 MEQ PO TBCR: 40.0000 meq | EXTENDED_RELEASE_TABLET | Freq: Once | ORAL | Status: DC

## 2024-03-10 MED ORDER — POTASSIUM CHLORIDE CRYS ER 20 MEQ PO TBCR
20.0000 meq | EXTENDED_RELEASE_TABLET | Freq: Once | ORAL | Status: AC
  Administered 2024-03-10: 20 meq via ORAL
  Filled 2024-03-10: qty 1

## 2024-03-10 MED ORDER — POTASSIUM CHLORIDE 10 MEQ/50ML IV SOLN
10.0000 meq | INTRAVENOUS | Status: AC
  Administered 2024-03-10 (×4): 10 meq via INTRAVENOUS
  Filled 2024-03-10 (×4): qty 50

## 2024-03-10 NOTE — Progress Notes (Signed)
 [facility] ADULT ICU REPLACEMENT PROTOCOL   The patient does apply for the [facility] Adult ICU Electrolyte Replacment Protocol based on the criteria listed below:   1.Exclusion criteria: TCTS, ECMO, Dialysis, and Myasthenia Gravis patients 2. Is GFR >/= 30 ml/min? Yes.    Patient's GFR today is 46 3. Is SCr </= 2? Yes.   Patient's SCr is 1.59 mg/dL 4. Did SCr increase >/= 0.5 in 24 hours? No. 5.Pt's weight >40kg  Yes.   6. Abnormal electrolyte(s): potassium 3.2, mag 1.9  7. Electrolytes replaced per protocol 8.  Call MD STAT for K+ </= 2.5, Phos </= 1, or Mag </= 1 Physician:  protocol  Claretta JINNY Sharps 03/10/2024 6:21 AM

## 2024-03-10 NOTE — Progress Notes (Signed)
 Brief Progress Note  Doing well, ambulated around the unit today.  Nurse mentioned that Impella exit site has been oozy. Dressing removed, clot cleaned off, no obvious source of bleeding.  Gauze wrapped around exit site and re-dressed.  Continue to re-dress graft exit site as needed. Heparin  on hold.  Con Clunes, MD Cardiothoracic Surgery Pager: [phone]

## 2024-03-10 NOTE — Evaluation (Addendum)
 Physical Therapy Evaluation Patient Details Name: Timothy Nicholson MRN: [id number] DOB: 09/29/1950 Today's Date: 03/10/2024  History of Present Illness  73 yo M adm 03/03/24 with chest pain, STEMI, emergent LHC with PCI of LAD, hypotensive, Impella CP. Cardiogenic shock 11/25 with impella 5.5 insertion and cystoscopy for hematuria. Intubated 11/25-11/26. PMhx: HTN, HLD, BPH, CKD  Clinical Impression  Pt pleasant and very willing to attempt mobility. Pt educated for RUE restrictions and required increased assist due to lines but anticipate he will progress well as medical status progresses. Pt with decreased strength, transfers and gait who will benefit from acute therapy to maximize mobility, safety and independence to decrease burden of care. Pt encouraged to mobilize with staff. REcommend HHPT but pt Welz progress to not needing post acute therapy.  P-7 on impella 5.5 HR 89-94 SPo2 93% on 3L        If plan is discharge home, recommend the following: A little help with walking and/or transfers;A little help with bathing/dressing/bathroom;Assistance with cooking/housework;Assist for transportation;Help with stairs or ramp for entrance   Can travel by private vehicle        Equipment Recommendations Rolling walker (2 wheels);BSC/3in1  Recommendations for Other Services  OT consult    Functional Status Assessment Patient has had a recent decline in their functional status and demonstrates the ability to make significant improvements in function in a reasonable and predictable amount of time.     Precautions / Restrictions Precautions Precautions: Other (comment);Fall Recall of Precautions/Restrictions: Intact Precaution/Restrictions Comments: curator, art line      Mobility  Bed Mobility Overal bed mobility: Needs Assistance Bed Mobility: Rolling, Sidelying to Sit Rolling: Min assist Sidelying to sit: Min assist       General bed mobility comments: cues for sequence,  assist to roll and rise from surface +2 assist for lines    Transfers Overall transfer level: Needs assistance   Transfers: Sit to/from Stand Sit to Stand: Min assist, +2 safety/equipment           General transfer comment: min assist to rise from bed with cues not to push with RUE, +2 for lines    Ambulation/Gait Ambulation/Gait assistance: Min assist, +2 safety/equipment Gait Distance (Feet): 200 Feet Assistive device: Rolling walker (2 wheels) Gait Pattern/deviations: Step-through pattern, Decreased stride length   Gait velocity interpretation: <1.8 ft/sec, indicate of risk for recurrent falls   General Gait Details: slow steady gait with +3 assist for lines and chair follow  Stairs            Wheelchair Mobility     Tilt Bed    Modified Rankin (Stroke Patients Only)       Balance Overall balance assessment: Mild deficits observed, not formally tested                                           Pertinent Vitals/Pain Pain Assessment Pain Assessment: No/denies pain    Home Living Family/patient expects to be discharged to:: Private residence Living Arrangements: Spouse/significant other Available Help at Discharge: Family;Available 24 hours/day Type of Home: House Home Access: Stairs to enter   [employer] of Steps: 2   Home Layout: One level Home Equipment: None      Prior Function Prior Level of Function : Independent/Modified Independent;Working/employed;Driving  Extremity/Trunk Assessment   Upper Extremity Assessment Upper Extremity Assessment: Generalized weakness    Lower Extremity Assessment Lower Extremity Assessment: Generalized weakness    Cervical / Trunk Assessment Cervical / Trunk Assessment: Normal  Communication   Communication Communication: No apparent difficulties    Cognition Arousal: Alert Behavior During Therapy: WFL for tasks assessed/performed   PT -  Cognitive impairments: No apparent impairments                         Following commands: Intact       Cueing Cueing Techniques: Verbal cues     General Comments      Exercises General Exercises - Lower Extremity Long Arc Quad: AROM, Both, 10 reps, Seated, Strengthening   Assessment/Plan    PT Assessment Patient needs continued PT services  PT Problem List Decreased strength;Decreased activity tolerance;Decreased balance;Decreased mobility;Decreased knowledge of use of DME       PT Treatment Interventions Gait training;DME instruction;Stair training;Functional mobility training;Therapeutic activities;Therapeutic exercise;Patient/family education    PT Goals (Current goals can be found in the Care Plan section)  Acute Rehab PT Goals Patient Stated Goal: return home and to work PT Goal Formulation: With patient Time For Goal Achievement: 03/24/24 Potential to Achieve Goals: Good    Frequency Min 2X/week     Co-evaluation               AM-PAC PT 6 Clicks Mobility  Outcome Measure Help needed turning from your back to your side while in a flat bed without using bedrails?: A Little Help needed moving from lying on your back to sitting on the side of a flat bed without using bedrails?: A Little Help needed moving to and from a bed to a chair (including a wheelchair)?: A Little Help needed standing up from a chair using your arms (e.g., wheelchair or bedside chair)?: A Little Help needed to walk in hospital room?: A Lot Help needed climbing 3-5 steps with a railing? : Total 6 Click Score: 15    End of Session Equipment Utilized During Treatment: Gait belt Activity Tolerance: Patient tolerated treatment well Patient left: in chair;with call bell/phone within reach;with chair alarm set;with nursing/sitter in room Nurse Communication: Mobility status;Precautions PT Visit Diagnosis: Other abnormalities of gait and mobility (R26.89);Difficulty in walking,  not elsewhere classified (R26.2)    Time: 9142-9072 PT Time Calculation (min) (ACUTE ONLY): 30 min   Charges:   PT Evaluation $PT Eval High Complexity: 1 High PT Treatments $Gait Training: 8-22 mins PT General Charges $$ ACUTE PT VISIT: 1 Visit         Lenoard SQUIBB, PT Acute Rehabilitation Services Office: [phone]   Lenoard NOVAK Cariah Salatino 03/10/2024, 10:36 AM

## 2024-03-10 NOTE — Progress Notes (Signed)
 2 Days Post-Op  Subjective: His urine has cleared with now minimal CBI.  Hgb 9.2.  Cr. 1.59.  He has no complaints with the foley.  ROS:  Review of Systems  Constitutional: Negative.   Gastrointestinal: Negative.   Genitourinary: Negative.     Anti-infectives: Anti-infectives (From admission, onward)    Start     Dose/Rate Route Frequency Ordered Stop   03/03/24 1845  Ampicillin -Sulbactam (UNASYN ) 3 g in sodium chloride  0.9 % 100 mL IVPB        3 g 200 mL/hr over 30 Minutes Intravenous Every 6 hours 03/03/24 1747 03/08/24 2159       Current Facility-Administered Medications  Medication Dose Route Frequency Provider Last Rate Last Admin   acetaminophen  (TYLENOL ) tablet 650 mg  650 mg Oral Q4H PRN Cooper, Michael, MD   650 mg at 03/07/24 1208   amiodarone  (NEXTERONE  PREMIX) 360-4.14 MG/200ML-% (1.8 mg/mL) IV infusion  30 mg/hr Intravenous Continuous Bensimhon, Toribio SAUNDERS, MD 16.67 mL/hr at 03/10/24 0500 30 mg/hr at 03/10/24 0500   aspirin  EC tablet 81 mg  81 mg Oral Daily McLean, Dalton S, MD       Chlorhexidine  Gluconate Cloth 2 % PADS 6 each  6 each Topical Daily McLean, Dalton S, MD   6 each at 03/09/24 0932   DOBUTamine  (DOBUTREX ) infusion 4000 mcg/mL  4 mcg/kg/min (Order-Specific) Intravenous Continuous Rolan Ezra RAMAN, MD 4.76 mL/hr at 03/10/24 0500 4 mcg/kg/min at 03/10/24 0500   feeding supplement (ENSURE PLUS HIGH PROTEIN) liquid 237 mL  237 mL Oral TID BM McLean, Dalton S, MD   237 mL at 03/09/24 1315   finasteride  (PROSCAR ) tablet 5 mg  5 mg Oral Daily Colletta Manuelita Garre, PA-C   5 mg at 03/09/24 9068   HYDROmorphone  (DILAUDID ) injection 0.5 mg  0.5 mg Intravenous Q4H PRN Gretta Doffing P, DO       insulin  aspart (novoLOG ) injection 0-15 Units  0-15 Units Subcutaneous Q4H Hicks, Samantha B, NP   2 Units at 03/09/24 0447   magnesium  sulfate IVPB 2 g 50 mL  2 g Intravenous Once Rober Gianotti T, MD       melatonin tablet 3 mg  3 mg Oral QHS PRN Debarah Donley SAILOR, MD   3 mg at  03/04/24 2035   norepinephrine  (LEVOPHED ) 4mg  in (0.016 mg/mL) premix infusion  0-40 mcg/min Intravenous Titrated Bensimhon, Toribio SAUNDERS, MD 3.75 mL/hr at 03/10/24 0500 1 mcg/min at 03/10/24 0500   ondansetron  (ZOFRAN ) injection 4 mg  4 mg Intravenous Q6H PRN Colletta Manuelita Garre, PA-C   4 mg at 03/07/24 9192   Oral care mouth rinse  15 mL Mouth Rinse PRN McLean, Dalton S, MD       Oral care mouth rinse  15 mL Mouth Rinse PRN Bensimhon, Toribio SAUNDERS, MD       oxyCODONE  (Oxy IR/ROXICODONE ) immediate release tablet 5-10 mg  5-10 mg Oral Q4H PRN Cooper, Michael, MD       pantoprazole  (PROTONIX ) EC tablet 40 mg  40 mg Oral Daily Colletta Manuelita Garre, PA-C   40 mg at 03/09/24 0932   polyethylene glycol (MIRALAX  / GLYCOLAX ) packet 17 g  17 g Oral Daily Bensimhon, Toribio SAUNDERS, MD       potassium chloride  10 mEq in 50 mL *CENTRAL LINE* IVPB  10 mEq Intravenous Q1 Hr x 4 Mohan, Kinila T, MD       potassium chloride  SA (KLOR-CON  M) CR tablet 20 mEq  20 mEq Oral Q4H  Rober Jodelle DASEN, MD       promethazine  (PHENERGAN ) 12.5 mg in sodium chloride  0.9 % 50 mL IVPB  12.5 mg Intravenous Q6H PRN Rolan Ezra RAMAN, MD   Paused at 03/03/24 2233   QUEtiapine  (SEROQUEL ) tablet 25 mg  25 mg Oral QHS Claudene Toribio BROCKS, MD   25 mg at 03/09/24 2106   rosuvastatin  (CRESTOR ) tablet 20 mg  20 mg Oral Daily Wonda Sharper, MD   20 mg at 03/09/24 9068   senna-docusate (Senokot-S) tablet 2 tablet  2 tablet Oral QHS Bensimhon, Toribio SAUNDERS, MD   2 tablet at 03/09/24 2106   simethicone  (MYLICON) chewable tablet 80 mg  80 mg Oral QID PRN Claudene Toribio BROCKS, MD   80 mg at 03/07/24 1610   sodium bicarbonate  25 mEq (Impella PURGE) in dextrose  5 % 1000 mL bag   Intracatheter Continuous Wonda Sharper, MD   New Bag at 03/06/24 [id number]   sodium chloride  flush (NS) 0.9 % injection 3 mL  3 mL Intravenous Q12H Cooper, Michael, MD   3 mL at 03/09/24 2107   sodium chloride  flush (NS) 0.9 % injection 3 mL  3 mL Intravenous PRN Wonda Sharper, MD        sodium chloride  irrigation 0.9 % 3,000 mL  3,000 mL Irrigation Continuous Smith, Daniel C, MD   3,000 mL at 03/09/24 0421   tamsulosin  (FLOMAX ) capsule 0.4 mg  0.4 mg Oral Daily Colletta Manuelita Garre, PA-C   0.4 mg at 03/09/24 0932   ticagrelor  (BRILINTA ) tablet 90 mg  90 mg Oral BID McLean, Dalton S, MD   90 mg at 03/09/24 2106   traZODone  (DESYREL ) tablet 50 mg  50 mg Oral QHS Claudene Toribio BROCKS, MD   50 mg at 03/09/24 2106   vasopressin  (PITRESSIN) 20 Units in 100 mL (0.2 unit/mL) infusion-*FOR SHOCK*  0-0.03 Units/min Intravenous Continuous Bensimhon, Toribio SAUNDERS, MD 9 mL/hr at 03/10/24 0609 0.03 Units/min at 03/10/24 0609     Objective: Vital signs in last 24 hours: Temp:  [97 F (36.1 C)-99.7 F (37.6 C)] 99.3 F (37.4 C) (11/27 0600) Pulse Rate:  [62-105] 78 (11/27 0600) Resp:  [9-44] 19 (11/27 0600) BP: (111-144)/(69-103) 140/78 (11/26 1900) SpO2:  [90 %-100 %] 97 % (11/27 0600) Arterial Line BP: (73-137)/(43-83) 115/53 (11/27 0600) FiO2 (%):  [40 %] 40 % (11/26 0740) Weight:  [86.2 kg] 86.2 kg (11/27 0500)  Intake/Output from previous day: 11/26 0701 - 11/27 0700 In: 12890.3 [I.V.:794.5; IV Piggyback:50] Out: 88079 [Urine:11900; Emesis/NG output:10; Drains:10] Intake/Output this shift: Total I/O In: 6311.2 [I.V.:367.3; Other:5943.9] Out: 5000 [Urine:5000]   Physical Exam Vitals reviewed.  Constitutional:      Appearance: Normal appearance.  Genitourinary:    Comments: Urine clear in foley bag.  Neurological:     Mental Status: He is alert.     Lab Results:  Recent Labs    03/09/24 0450 03/10/24 0505  WBC 8.4 7.5  HGB 10.7* 9.5*  HCT 30.7* 27.5*  PLT 92* 121*   BMET Recent Labs    03/09/24 0450 03/10/24 0505  NA 132* 133*  K 3.6 3.2*  CL 100 101  CO2 22 23  GLUCOSE 139* 105*  BUN 38* 43*  CREATININE 1.66* 1.59*  CALCIUM  7.4* 7.3*   PT/INR Recent Labs    03/08/24 0750 03/09/24 0450  LABPROT 14.9 15.6*  INR 1.1 1.2   ABG Recent Labs     03/08/24 1837 03/09/24 0438  PHART 7.418 7.433  HCO3 25.3  25.9    Studies/Results: DG CHEST PORT 1 VIEW Result Date: 03/09/2024 EXAM: 1 VIEW(S) XRAY OF THE CHEST 03/09/2024 05:41:00 AM COMPARISON: 03/08/2024 CLINICAL HISTORY: Ventilator dependence (HCC) FINDINGS: LINES, TUBES AND DEVICES: Endotracheal tube in place with tip 4 cm above the carina. Enteric tube in place, coursing below the diaphragm with distal tip beyond the inferior margin of the film. Right IJ pulmonary arterial catheter in place, terminating within the right main pulmonary artery. Impella device in place with distal aspect projecting over the right ventricle. LUNGS AND PLEURA: Hazy lower lobe opacities. Bilateral layering pleural effusions. No pneumothorax. HEART AND MEDIASTINUM: No acute abnormality of the cardiac and mediastinal silhouettes. Aortic atherosclerosis. BONES AND SOFT TISSUES: No acute osseous abnormality. IMPRESSION: 1. Bilateral layering pleural effusions with associated lower lobe opacities, compatible with atelectasis versus edema. 2. Endotracheal tube, enteric tube, right IJ pulmonary arterial catheter, and Impella device in expected positions. Electronically signed by: Katheleen Faes MD 03/09/2024 09:52 AM EST RP Workstation: [id number]   DG Chest Port 1 View Result Date: 03/08/2024 EXAM: 1 VIEW(S) XRAY OF THE CHEST 03/08/2024 06:31:40 PM COMPARISON: 03/06/2024, 03/05/2024 CLINICAL HISTORY: History of ETT [id number] FINDINGS: LINES, TUBES AND DEVICES: Endotracheal tube in place with tip 3 cm above the carina. Enteric tube in place coursing below the hemidiaphragm with tip and side port overlying the expected region of the gastric lumen. Right internal jugular Swan-Ganz catheter in place with tip overlying the expected region of the right main pulmonary artery. Removal of previously noted ascending approach impella device. Placement of right axillary impella device with tip overlying expected location of the left  ventricle. LUNGS AND PLEURA: Interval increase in diffuse hazy opacities of the right lung. Bilateral pleural effusions, left greater than right. Mild pulmonary edema. No pneumothorax. Worsened left basilar consolidation. HEART AND MEDIASTINUM: Atherosclerotic plaque noted. Stable cardiomediastinal silhouette. BONES AND SOFT TISSUES: No acute osseous abnormality. IMPRESSION: 1. Support lines and tubes as detailed above. 2. Stable cardiomediastinal silhouette with worsened vascular congestion and diffuse pulmonary edema compared to prior. Suspect increased pleural effusions. Worsened consolidation at the left base, probable atelectasis. Electronically signed by: Luke Bun MD 03/08/2024 07:02 PM EST RP Workstation: [id number]   DG C-Arm 1-60 Min-No Report Result Date: 03/08/2024 Fluoroscopy was utilized by the requesting physician.  No radiographic interpretation.     Assessment and Plan: Gross hematuria has resolved.    D/C CBI.   Consider voiding trial when foley not needed for fluid management.   Will need to check PVR's once foley removed.       LOS: 7 days    Norleen Seltzer 03/10/2024

## 2024-03-10 NOTE — Anesthesia Postprocedure Evaluation (Signed)
 Anesthesia Post Note  Patient: Timothy Nicholson  Procedure(s) Performed: INSERTION, CARDIAC ASSIST DEVICE, IMPELLA ECHOCARDIOGRAM, TRANSESOPHAGEAL, INTRAOPERATIVE CYSTOSCOPY CLOT EVACUATION AND FULGURATION     Patient location during evaluation: SICU Anesthesia Type: General Level of consciousness: awake Pain management: pain level controlled Vital Signs Assessment: post-procedure vital signs reviewed and stable Respiratory status: spontaneous breathing Cardiovascular status: stable Anesthetic complications: no   No notable events documented.  Last Vitals:  Vitals:   03/10/24 0830 03/10/24 0833  BP:  131/74  Pulse: 82 80  Resp: 19 20  Temp: 37.4 C 37.4 C  SpO2: 96% 97%    Last Pain:  Vitals:   03/10/24 0715  TempSrc:   PainSc: 0-No pain                 Norleen Pope

## 2024-03-10 NOTE — Progress Notes (Addendum)
 Brief Progress Note  Doing well this morning, eating breakfast.  Stood yesterday but very weak Impella 5.5: P7, flow 4.1 LPM  Vitals:   03/10/24 0700 03/10/24 0715  BP:    Pulse: 85 89  Resp: 16 (!) 22  Temp: 99.1 F (37.3 C) 99.1 F (37.3 C)  SpO2: 94% 94%   Exam: Axillary incision dry, very soft, non-tender Drain with minimal sanguinous output  Plan: - Remove drain today - Hematuria slightly worse this morning, will hold off on hep gtt for now.  Con Clunes, MD Cardiothoracic Surgery Pager: [phone]

## 2024-03-10 NOTE — Progress Notes (Signed)
 Patient ID: Timothy Nicholson, male   DOB: 19-Apr-1950, 73 y.o.   MRN: [id number]     Advanced Heart Failure Rounding Note  Cardiologist: None  Chief Complaint: AMI CS   Patient Profile:    73 y/o male admitted w/ acute anterior STEMI c/b CS requiring Impella CP support, further c/b + RPB/hemorrhagic shock. Given ongoing support needs and inability to wean off chemical inotrope/pressor support, was upgraded to Impella 5.5 on 11/25.   Subjective:    POD #2, s/p Impella 5.5  Feels much better today. Was able to walk with staff.   Impella 5.5 at P-7 with 4.1L flow. Waveforms ok  Still with some hematuria   PAP: (25-66)/(1-48) 41/18 CVP:  [0 mmHg-35 mmHg] 13 mmHg CO:  [6.1 L/min-7 L/min] 6.5 L/min CI:  [2.9 L/min/m2-3.38 L/min/m2] 3.12 L/min/m2   Objective:   Weight Range: 86.2 kg Body mass index is 27.27 kg/m.   Vital Signs:   Temp:  [98.8 F (37.1 C)-99.7 F (37.6 C)] 99.3 F (37.4 C) (11/27 1000) Pulse Rate:  [73-105] 94 (11/27 1031) Resp:  [15-40] 34 (11/27 1000) BP: (126-144)/(73-91) 135/79 (11/27 0900) SpO2:  [90 %-98 %] 92 % (11/27 1031) Arterial Line BP: (73-135)/(43-72) 124/63 (11/27 1000) Weight:  [86.2 kg] 86.2 kg (11/27 0500) Last BM Date : 03/08/24  Weight change: Filed Weights   03/08/24 0600 03/09/24 0500 03/10/24 0500  Weight: 88.7 kg 86.6 kg 86.2 kg    Intake/Output:   Intake/Output Summary (Last 24 hours) at 03/10/2024 1040 Last data filed at 03/10/2024 1000 Gross per 24 hour  Intake 12017.75 ml  Output 88089 ml  Net 107.75 ml      Physical Exam   General:  Sitting up in chair  No resp difficulty HEENT: normal Neck: supple. RIJ swan Cor: Regular rate & rhythm. Impella site ok  Lungs: clear Abdomen: soft, nontender, nondistended.Good bowel sounds. Extremities: no cyanosis, clubbing, rash, 2+ edema  Foley  Neuro: alert & orientedx3, cranial nerves grossly intact. moves all 4 extremities w/o difficulty. Affect pleasant   Telemetry   NSR  80-90s,Personally reviewed  Labs    CBC Recent Labs    03/09/24 0450 03/10/24 0505  WBC 8.4 7.5  HGB 10.7* 9.5*  HCT 30.7* 27.5*  MCV 88.0 89.6  PLT 92* 121*   Basic Metabolic Panel Recent Labs    88/73/74 0450 03/10/24 0505  NA 132* 133*  K 3.6 3.2*  CL 100 101  CO2 22 23  GLUCOSE 139* 105*  BUN 38* 43*  CREATININE 1.66* 1.59*  CALCIUM  7.4* 7.3*  MG 2.0 1.9   Liver Function Tests Recent Labs    03/09/24 0450  AST 35  ALT 33  ALKPHOS 42  BILITOT 1.5*  PROT 5.1*  ALBUMIN  2.4*   No results for input(s): LIPASE, AMYLASE in the last 72 hours. Cardiac Enzymes No results for input(s): CKTOTAL, CKMB, CKMBINDEX, TROPONINI in the last 72 hours.  BNP: BNP (last 3 results) No results for input(s): BNP in the last 8760 hours.  ProBNP (last 3 results) No results for input(s): PROBNP in the last 8760 hours.   D-Dimer No results for input(s): DDIMER in the last 72 hours. Hemoglobin A1C No results for input(s): HGBA1C in the last 72 hours.  Fasting Lipid Panel No results for input(s): CHOL, HDL, LDLCALC, TRIG, CHOLHDL, LDLDIRECT in the last 72 hours.  Thyroid Function Tests No results for input(s): TSH, T4TOTAL, T3FREE, THYROIDAB in the last 72 hours.  Invalid input(s): FREET3  Other  results:   Imaging    No results found.      Medications:     Scheduled Medications:  aspirin  EC  81 mg Oral Daily   Chlorhexidine  Gluconate Cloth  6 each Topical Daily   feeding supplement  237 mL Oral TID BM   finasteride   5 mg Oral Daily   insulin  aspart  0-15 Units Subcutaneous Q4H   pantoprazole   40 mg Oral Daily   polyethylene glycol  17 g Oral Daily   potassium chloride   20 mEq Oral Q4H   QUEtiapine   25 mg Oral QHS   rosuvastatin   20 mg Oral Daily   senna-docusate  2 tablet Oral QHS   sodium chloride  flush  3 mL Intravenous Q12H   tamsulosin   0.4 mg Oral Daily   ticagrelor   90 mg Oral BID   traZODone   50 mg  Oral QHS    Infusions:  amiodarone  30 mg/hr (03/10/24 1000)   DOBUTamine  4 mcg/kg/min (03/10/24 1000)   norepinephrine  (LEVOPHED ) Adult infusion 1 mcg/min (03/10/24 1000)   potassium chloride  10 mEq (03/10/24 1005)   promethazine  (PHENERGAN ) injection (IM or IVPB) Stopped (03/03/24 2233)   sodium bicarbonate  25 mEq (Impella PURGE) in dextrose  5 % 1000 mL bag     sodium chloride  irrigation     vasopressin  0.03 Units/min (03/10/24 1000)    PRN Medications: acetaminophen , HYDROmorphone  (DILAUDID ) injection, melatonin, ondansetron  (ZOFRAN ) IV, mouth rinse, mouth rinse, oxyCODONE , promethazine  (PHENERGAN ) injection (IM or IVPB), simethicone , sodium chloride  flush     Assessment/Plan   1. CAD: Anterior STEMI with occluded proximal LAD; the RCA was not selectively engaged but appeared to be relatively small with proximal occlusion. Now s/p DES to proximal LAD and PTCA to ostial D1.  - no s/s angina - continue ASA/statin/Brilinta  - management of post MI shock as below 2. Cardiogenic shock: Due to anterior MI.  Limited echo with Impella CP showed EF 25% with LAD territory akinesis, normal RV size/mildly decreased systolic function. Now w/ Impella 5.5 to give extended time for recovery vs bridge to durable LVAD.  - Impella 5.5 on P-7 with 4.1L flow. Good waveforms   - Wean NE and VP as tolerated today. Continue DBA 4, MSBP goal 100 - repeat IV Lasix  40 mg x 1  - Can pull swan. Leave introducer - Place PICC 3. AKI on CKD stage 3: Creatinine 1.6 => 2.1 -> 2.3 -> ->1.7 -> 1.59 - Due to ATN. Improving 4. VT: Peri-STEMI. - VT quiescent  - Continue amiodarone  gtt 30 mg/hr for now  5. Heme: Hemorrhagic component to shock with hematoma at Impella access site and RP hematoma + hematuria.  CTA showed RP hematoma without active extravasation.  - No heparin  as above (HCO3 purge).  - hgb 10.7 -> 9.5 today today. Transfuse Hgb < 8.  - follow CBC  6. Pulmonary: Possible aspiration PNA.  - completed  5 day course of Unasyn .  7. Hematuria - Urology managing s/p fulguration of bladder  - pull Foley today - continue tohold heparin  8. Thrombocytopenia - trending back up, 63>>92K > 121 today  - follow CBC  9. Illeus: - resolving 10. Retroperitoneal hematoma - stable  CRITICAL CARE Performed by: [name]. MD   Total critical care time:  Critical care time was exclusive of separately billable procedures and treating other patients.  Critical care was necessary to treat or prevent imminent or life-threatening deterioration.  Critical care was time spent personally by me on the following activities: development  of treatment plan with patient and/or surrogate as well as nursing, discussions with consultants, evaluation of patient's response to treatment, examination of patient, obtaining history from patient or surrogate, ordering and performing treatments and interventions, ordering and review of laboratory studies, ordering and review of radiographic studies, pulse oximetry and re-evaluation of patient's condition.    Length of Stay: 7  [name], MD  03/10/2024, 10:40 AM  Advanced Heart Failure Team Pager [phone] (M-F; 7a - 5p)  Please contact [facility] for night-coverage after hours (5p -7a ) and weekends on [email or web address]    Agree with above.   Extubated this am. Remains on Impella 5.5. Weaning NE. On DBA 4 and VP 0.03    Hemodynamics stable.   Impella at P-7 Flow 3.8L waveforms ok   CXR with mild edema. RLL infiltrate/effusion   Hematuria improved  General:  Sitting up in bed. No resp difficulty HEENT: normal Neck: supple. RIJ swan Cor: Regular rate & rhythm. Impella site with mild oozing  Lungs: coarse Abdomen: soft, nontender, nondistended.Good bowel sounds. Extremities: no cyanosis, clubbing, rash, edema  R groin ecchymosis no hematoma Neuro: alert & orientedx3, cranial nerves grossly intact. moves all 4 extremities w/o difficulty.  Affect pleasant  Hemodynamics improved with Impella 5.5. VAD interrogated personally. Parameters stable.  Continue to wean NE as tolerated.   Lasix  40 IV x q  CCM to u/s R lung  Ambulate.   CRITICAL CARE Performed by: Fuel Toribio  Total critical care time: 42 minutes  Critical care time was exclusive of separately billable procedures and treating other patients.  Critical care was necessary to treat or prevent imminent or life-threatening deterioration.  Critical care was time spent personally by me (independent of midlevel providers or residents) on the following activities: development of treatment plan with patient and/or surrogate as well as nursing, discussions with consultants, evaluation of patient's response to treatment, examination of patient, obtaining history from patient or surrogate, ordering and performing treatments and interventions, ordering and review of laboratory studies, ordering and review of radiographic studies, pulse oximetry and re-evaluation of patient's condition.  [name], MD  10:40 AM

## 2024-03-10 NOTE — Progress Notes (Deleted)
 PHARMACY - ANTICOAGULATION CONSULT NOTE  Pharmacy Consult for heparin  Indication: Impella 5.5   Allergies  Allergen Reactions   Sulfa Antibiotics Rash    Patient Measurements: Height: 5' 10 (177.8 cm) Weight: 86.2 kg (190 lb 0.6 oz) IBW/kg (Calculated) : 73  Vital Signs: Temp: 99.3 F (37.4 C) (11/27 0833) Temp Source: Core (Comment) (11/27 0415) BP: 131/74 (11/27 0833) Pulse Rate: 80 (11/27 0833)  Labs: Recent Labs    03/08/24 0750 03/08/24 1315 03/08/24 1414 03/08/24 1519 03/08/24 2210 03/09/24 0438 03/09/24 0450 03/10/24 0505  HGB  --    < > 8.8*   < > 10.6* 9.9* 10.7* 9.5*  HCT  --    < > 26.0*   < > 30.4* 29.0* 30.7* 27.5*  PLT  --   --   --   --  86*  --  92* 121*  LABPROT 14.9  --   --   --   --   --  15.6*  --   INR 1.1  --   --   --   --   --  1.2  --   CREATININE  --    < > 1.50*  --   --   --  1.66* 1.59*   < > = values in this interval not displayed.    Estimated Creatinine Clearance: 42.7 mL/min (A) (by C-G formula based on SCr of 1.59 mg/dL (H)).   Medical History: Past Medical History:  Diagnosis Date   Arthritis    osteo   Back pain    Benign prostatic hyperplasia with urinary obstruction and other lower urinary tract symptoms    Cancer (HCC)    maligant polyp   Dyslipidemia    Dysplastic nevus 08/13/2006   L chest pectoral - mod, excision 06.10.2008   Dysplastic nevus 09/29/2006   L groin - mild   Elevated PSA    Enlarged prostate    Family history of breast cancer    Family history of colon cancer    Family history of lung cancer    Family history of pancreatic cancer    Family history of prostate cancer    GERD (gastroesophageal reflux disease)    Headache    migraine without aura   HOH (hard of hearing)    Hypercholesterolemia    Hypertension    Personal history of colon cancer 01/19/2018   Scoliosis     Medications:  Scheduled:   aspirin  EC  81 mg Oral Daily   Chlorhexidine  Gluconate Cloth  6 each Topical Daily    feeding supplement  237 mL Oral TID BM   finasteride   5 mg Oral Daily   insulin  aspart  0-15 Units Subcutaneous Q4H   pantoprazole   40 mg Oral Daily   polyethylene glycol  17 g Oral Daily   potassium chloride   20 mEq Oral Q4H   QUEtiapine   25 mg Oral QHS   rosuvastatin   20 mg Oral Daily   senna-docusate  2 tablet Oral QHS   sodium chloride  flush  3 mL Intravenous Q12H   tamsulosin   0.4 mg Oral Daily   ticagrelor   90 mg Oral BID   traZODone   50 mg Oral QHS    Assessment: 73 yom admitted with anterior STEMI complicated by cardiogenic shock requiring Impella CP. Has RP bleeding and hematuria. On impella 5.5.  Has been on sodium bicarb purge. Hgb 9.5, plt 121. LDH 476. Discussed with team, okay to start low dose fixed rate heparin . Hematuria has  improved - RP bleeding stable > no other s/sx of bleeding.   Goal of Therapy:  Heparin  level <0.3 units/ml while fixed rate heparin  Monitor platelets by anticoagulation protocol: Yes   Plan:  Continue sodium bicarb purge  Start heparin  infusion at 500 units/hr Order heparin  level in 8 hours to make sure not elevated - no titration unless elevated Monitor daily HL, CBC, and for s/sx of bleeding   Thank you for allowing pharmacy to participate in this patient's care,  Suzen Sour, PharmD, BCCCP Clinical Pharmacist  Phone: [phone] 03/10/2024 9:16 AM  Please check AMION for all [facility] Pharmacy phone numbers After 10:00 PM, call Main Pharmacy [phone]

## 2024-03-10 NOTE — Progress Notes (Signed)
 NAME:  Roldan Laforest Cahue, MRN:  [id number], DOB:  12/20/1950, LOS: 7 ADMISSION DATE:  03/03/2024, CONSULTATION DATE: 03/03/2024 REFERRING MD: Dr. Ezra, CHIEF COMPLAINT: Chest pain  History of Present Illness:  73 year old male with hypertension, hyperlipidemia, BPH, CKD 3a and prediabetes who presented as a code STEMI, after he developed chest pain while working at home, he was emergently taken to Cath Lab, underwent LHC.  During procedure he was hypotensive requiring Levophed  infusion, vomited and aspirated requiring nonrebreather facemask.  His LVEDP was 30 with wedge pressure of 24, CPAP was placed after that he went into brief VT/VF and was shocked.  Patient was noted to have occluded proximal LAD, underwent PCI and DES. Post procedure complicated by cardiogenic shock with Impella CP placement  Pertinent  Medical History   Past Medical History:  Diagnosis Date   Arthritis    osteo   Back pain    Benign prostatic hyperplasia with urinary obstruction and other lower urinary tract symptoms    Cancer (HCC)    maligant polyp   Dyslipidemia    Dysplastic nevus 08/13/2006   L chest pectoral - mod, excision 06.10.2008   Dysplastic nevus 09/29/2006   L groin - mild   Elevated PSA    Enlarged prostate    Family history of breast cancer    Family history of colon cancer    Family history of lung cancer    Family history of pancreatic cancer    Family history of prostate cancer    GERD (gastroesophageal reflux disease)    Headache    migraine without aura   HOH (hard of hearing)    Hypercholesterolemia    Hypertension    Personal history of colon cancer 01/19/2018   Scoliosis      Significant Hospital Events: Including procedures, antibiotic start and stop dates in addition to other pertinent events   11/20: STEMI with PCI to LAD, Impella CP placement; RP bleed noted 11/24 CBI for hematuria 11/25 5-5 impella, cystoscopy and fulguration of prostate; left intubated  Interim History  / Subjective:  Looks great this am!  Able to walk around unit.  Objective    Blood pressure 135/79, pulse 90, temperature 99.3 F (37.4 C), resp. rate (!) 34, height 5' 10 (1.778 m), weight 86.2 kg, SpO2 96%. PAP: (25-66)/(1-48) 41/18 CVP:  [0 mmHg-35 mmHg] 13 mmHg CO:  [6.1 L/min-7 L/min] 6.5 L/min CI:  [2.9 L/min/m2-3.38 L/min/m2] 3.12 L/min/m2      Intake/Output Summary (Last 24 hours) at 03/10/2024 1012 Last data filed at 03/10/2024 1000 Gross per 24 hour  Intake 11939.48 ml  Output 88089 ml  Net 29.48 ml   Filed Weights   03/08/24 0600 03/09/24 0500 03/10/24 0500  Weight: 88.7 kg 86.6 kg 86.2 kg    Examination: No distress Minimal oozing around impella Old fem CP site looks okay Urine clearing Abd soft  Resolved problem list   Assessment and Plan  Acute anterior STEMI status post PCI with DES to LAD 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 - Incidental finding, outpatient follow up needed ICU delirium R effusion- too small to tap 11/26   - Completed unasyn  - Continue DAPT - AC challenge tomorrow - Push mobility - CBI weaned off, to remove foley later today - Pressors for MAP 65 - Diuretics, intropes, impella (bicarb purge) per AHF - Reorient PRN, encourage day/night cycles and family  visitation, minimize nightly disruptions - Chest X-ray in AM - [place] have to restart bowel regimen tomorrow - DC fingersticks - Levophed  for MAP 65 - d/w AHF, given stability off vent will be available PRN  Rolan Sharps MD PCCM

## 2024-03-11 ENCOUNTER — Other Ambulatory Visit (HOSPITAL_COMMUNITY): Payer: Self-pay | Admitting: Internal Medicine

## 2024-03-11 ENCOUNTER — Inpatient Hospital Stay (HOSPITAL_COMMUNITY)

## 2024-03-11 ENCOUNTER — Other Ambulatory Visit: Payer: Self-pay

## 2024-03-11 DIAGNOSIS — Z95811 Presence of heart assist device: Secondary | ICD-10-CM | POA: Diagnosis not present

## 2024-03-11 DIAGNOSIS — R570 Cardiogenic shock: Secondary | ICD-10-CM | POA: Diagnosis not present

## 2024-03-11 DIAGNOSIS — I2102 ST elevation (STEMI) myocardial infarction involving left anterior descending coronary artery: Secondary | ICD-10-CM | POA: Diagnosis not present

## 2024-03-11 LAB — BASIC METABOLIC PANEL WITH GFR
Anion gap: 12 (ref 5–15)
Anion gap: 8 (ref 5–15)
BUN: 40 mg/dL — ABNORMAL HIGH (ref 8–23)
BUN: 36 mg/dL — ABNORMAL HIGH (ref 8–23)
CO2: 24 mmol/L (ref 22–32)
CO2: 24 mmol/L (ref 22–32)
Calcium: 7.7 mg/dL — ABNORMAL LOW (ref 8.9–10.3)
Calcium: 7.6 mg/dL — ABNORMAL LOW (ref 8.9–10.3)
Chloride: 100 mmol/L (ref 98–111)
Chloride: 104 mmol/L (ref 98–111)
Creatinine, Ser: 1.66 mg/dL — ABNORMAL HIGH (ref 0.61–1.24)
Creatinine, Ser: 1.58 mg/dL — ABNORMAL HIGH (ref 0.61–1.24)
GFR, Estimated: 43 mL/min — ABNORMAL LOW (ref >60)
GFR, Estimated: 46 mL/min — ABNORMAL LOW (ref >60)
Glucose, Bld: 112 mg/dL — ABNORMAL HIGH (ref 70–99)
Glucose, Bld: 131 mg/dL — ABNORMAL HIGH (ref 70–99)
Potassium: 3.6 mmol/L (ref 3.5–5.1)
Potassium: 3.7 mmol/L (ref 3.5–5.1)
Sodium: 136 mmol/L (ref 135–145)
Sodium: 136 mmol/L (ref 135–145)

## 2024-03-11 LAB — CBC
HCT: 29.4 % — ABNORMAL LOW (ref 39.0–52.0)
HCT: 28.4 % — ABNORMAL LOW (ref 39.0–52.0)
Hemoglobin: 10 g/dL — ABNORMAL LOW (ref 13.0–17.0)
Hemoglobin: 9.3 g/dL — ABNORMAL LOW (ref 13.0–17.0)
MCH: 30.9 pg (ref 26.0–34.0)
MCH: 30.5 pg (ref 26.0–34.0)
MCHC: 34 g/dL (ref 30.0–36.0)
MCHC: 32.7 g/dL (ref 30.0–36.0)
MCV: 90.7 fL (ref 80.0–100.0)
MCV: 93.1 fL (ref 80.0–100.0)
Platelets: 177 K/uL (ref 150–400)
Platelets: 200 K/uL (ref 150–400)
RBC: 3.24 MIL/uL — ABNORMAL LOW (ref 4.22–5.81)
RBC: 3.05 MIL/uL — ABNORMAL LOW (ref 4.22–5.81)
RDW: 15.6 % — ABNORMAL HIGH (ref 11.5–15.5)
RDW: 15.4 % (ref 11.5–15.5)
WBC: 8.4 K/uL (ref 4.0–10.5)
WBC: 9.2 K/uL (ref 4.0–10.5)
nRBC: 0 % (ref 0.0–0.2)
nRBC: 0 % (ref 0.0–0.2)

## 2024-03-11 LAB — POCT I-STAT 7, (LYTES, BLD GAS, ICA,H+H)
Acid-base deficit: 2 mmol/L (ref 0.0–2.0)
Bicarbonate: 22.4 mmol/L (ref 20.0–28.0)
Calcium, Ion: 1.09 mmol/L — ABNORMAL LOW (ref 1.15–1.40)
HCT: 20 % — ABNORMAL LOW (ref 39.0–52.0)
Hemoglobin: 6.8 g/dL — CL (ref 13.0–17.0)
O2 Saturation: 100 %
Potassium: 3.8 mmol/L (ref 3.5–5.1)
Sodium: 133 mmol/L — ABNORMAL LOW (ref 135–145)
TCO2: 24 mmol/L (ref 22–32)
pCO2 arterial: 37.4 mmHg (ref 32–48)
pH, Arterial: 7.385 (ref 7.35–7.45)
pO2, Arterial: 199 mmHg — ABNORMAL HIGH (ref 83–108)

## 2024-03-11 LAB — LACTATE DEHYDROGENASE: LDH: 507 U/L — ABNORMAL HIGH (ref 105–235)

## 2024-03-11 LAB — COOXEMETRY PANEL
Carboxyhemoglobin: 3.6 % — ABNORMAL HIGH (ref 0.5–1.5)
Methemoglobin: 0.9 % (ref 0.0–1.5)
O2 Saturation: 76 %
Total hemoglobin: 10 g/dL — ABNORMAL LOW (ref 12.0–16.0)

## 2024-03-11 LAB — MAGNESIUM: Magnesium: 2.2 mg/dL (ref 1.7–2.4)

## 2024-03-11 MED ORDER — AMIODARONE HCL 200 MG PO TABS
200.0000 mg | ORAL_TABLET | Freq: Every day | ORAL | Status: DC
  Administered 2024-03-11 – 2024-03-17 (×7): 200 mg via ORAL
  Filled 2024-03-11 (×7): qty 1

## 2024-03-11 MED ORDER — POTASSIUM CHLORIDE CRYS ER 20 MEQ PO TBCR
40.0000 meq | EXTENDED_RELEASE_TABLET | Freq: Once | ORAL | Status: AC
  Administered 2024-03-11: 40 meq via ORAL
  Filled 2024-03-11: qty 2

## 2024-03-11 MED ORDER — DIGOXIN 125 MCG PO TABS
0.0625 mg | ORAL_TABLET | Freq: Every day | ORAL | Status: DC
  Administered 2024-03-11 – 2024-03-17 (×7): 0.0625 mg via ORAL
  Filled 2024-03-11 (×7): qty 1

## 2024-03-11 MED ORDER — ORAL CARE MOUTH RINSE: 15.0000 mL | OROMUCOSAL | Status: DC | PRN

## 2024-03-11 MED ORDER — SPIRONOLACTONE 12.5 MG HALF TABLET
12.5000 mg | ORAL_TABLET | Freq: Every day | ORAL | Status: DC
  Administered 2024-03-11 – 2024-03-15 (×5): 12.5 mg via ORAL
  Filled 2024-03-11 (×5): qty 1

## 2024-03-11 NOTE — Progress Notes (Signed)
 3 Days Post-Op  Subjective: He failed his VT yesterday with I&O caths of and before foley was replaced.  His urine is bloody but clearing and there have been no clots.  Hgb 9.3 at last check.  ROS:  Review of Systems  Constitutional: Negative.   Gastrointestinal: Negative.   Genitourinary: Negative.     Anti-infectives: Anti-infectives (From admission, onward)    Start     Dose/Rate Route Frequency Ordered Stop   03/03/24 1845  Ampicillin -Sulbactam (UNASYN ) 3 g in sodium chloride  0.9 % 100 mL IVPB        3 g 200 mL/hr over 30 Minutes Intravenous Every 6 hours 03/03/24 1747 03/08/24 2159       Current Facility-Administered Medications  Medication Dose Route Frequency Provider Last Rate Last Admin   acetaminophen  (TYLENOL ) tablet 650 mg  650 mg Oral Q4H PRN Cooper, Michael, MD   650 mg at 03/07/24 1208   amiodarone  (PACERONE ) tablet 200 mg  200 mg Oral Daily Bensimhon, Daniel R, MD   200 mg at 03/11/24 1029   aspirin  EC tablet 81 mg  81 mg Oral Daily McLean, Dalton S, MD   81 mg at 03/11/24 9092   Chlorhexidine  Gluconate Cloth 2 % PADS 6 each  6 each Topical Daily Rolan Ezra RAMAN, MD   6 each at 03/11/24 1030   digoxin (LANOXIN) tablet 0.0625 mg  0.0625 mg Oral Daily Bensimhon, Daniel R, MD   0.0625 mg at 03/11/24 1029   DOBUTamine  (DOBUTREX ) infusion 4000 mcg/mL  2.5 mcg/kg/min (Order-Specific) Intravenous Continuous Bensimhon, Toribio SAUNDERS, MD 2.98 mL/hr at 03/11/24 1800 2.5 mcg/kg/min at 03/11/24 1800   feeding supplement (ENSURE PLUS HIGH PROTEIN) liquid 237 mL  237 mL Oral TID BM McLean, Dalton S, MD   237 mL at 03/10/24 9060   finasteride  (PROSCAR ) tablet 5 mg  5 mg Oral Daily Colletta Manuelita Garre, PA-C   5 mg at 03/11/24 9091   HYDROmorphone  (DILAUDID ) injection 0.5 mg  0.5 mg Intravenous Q4H PRN Gretta Doffing P, DO       melatonin tablet 3 mg  3 mg Oral QHS PRN Debarah Donley SAILOR, MD   3 mg at 03/04/24 2035   norepinephrine  (LEVOPHED ) 4mg  in (0.016 mg/mL) premix  infusion  0-40 mcg/min Intravenous Titrated Bensimhon, Toribio SAUNDERS, MD   Stopped at 03/10/24 1056   ondansetron  (ZOFRAN ) injection 4 mg  4 mg Intravenous Q6H PRN Colletta Manuelita Garre, PA-C   4 mg at 03/07/24 9192   Oral care mouth rinse  15 mL Mouth Rinse PRN Rolan Ezra RAMAN, MD       oxyCODONE  (Oxy IR/ROXICODONE ) immediate release tablet 5-10 mg  5-10 mg Oral Q4H PRN Cooper, Michael, MD   5 mg at 03/10/24 1227   pantoprazole  (PROTONIX ) EC tablet 40 mg  40 mg Oral Daily Colletta Manuelita Garre, PA-C   40 mg at 03/11/24 9092   polyethylene glycol (MIRALAX  / GLYCOLAX ) packet 17 g  17 g Oral Daily Bensimhon, Toribio SAUNDERS, MD   17 g at 03/11/24 0908   promethazine  (PHENERGAN ) 12.5 mg in sodium chloride  0.9 % 50 mL IVPB  12.5 mg Intravenous Q6H PRN McLean, Dalton S, MD   Paused at 03/03/24 2233   QUEtiapine  (SEROQUEL ) tablet 25 mg  25 mg Oral QHS Claudene Toribio BROCKS, MD   25 mg at 03/10/24 2211   rosuvastatin  (CRESTOR ) tablet 20 mg  20 mg Oral Daily Cooper, Michael, MD   20 mg at 03/11/24 [phone]  senna-docusate (Senokot-S) tablet 2 tablet  2 tablet Oral QHS Bensimhon, Toribio SAUNDERS, MD   2 tablet at 03/10/24 2211   simethicone  (MYLICON) chewable tablet 80 mg  80 mg Oral QID PRN Claudene Toribio BROCKS, MD   80 mg at 03/07/24 1610   sodium bicarbonate  25 mEq (Impella PURGE) in dextrose  5 % 1000 mL bag   Intracatheter Continuous Cooper, Michael, MD   New Bag at 03/06/24 0238   sodium chloride  flush (NS) 0.9 % injection 3 mL  3 mL Intravenous Q12H Cooper, Michael, MD   3 mL at 03/11/24 9090   sodium chloride  flush (NS) 0.9 % injection 3 mL  3 mL Intravenous PRN Wonda Sharper, MD       sodium chloride  irrigation 0.9 % 3,000 mL  3,000 mL Irrigation Continuous Smith, Daniel C, MD   3,000 mL at 03/09/24 0421   spironolactone (ALDACTONE) tablet 12.5 mg  12.5 mg Oral Daily Bensimhon, Toribio SAUNDERS, MD   12.5 mg at 03/11/24 1029   tamsulosin  (FLOMAX ) capsule 0.4 mg  0.4 mg Oral Daily Colletta Manuelita Garre, PA-C   0.4 mg at 03/11/24 9092    ticagrelor  (BRILINTA ) tablet 90 mg  90 mg Oral BID McLean, Dalton S, MD   90 mg at 03/11/24 9091   traZODone  (DESYREL ) tablet 50 mg  50 mg Oral QHS Claudene Toribio BROCKS, MD   50 mg at 03/10/24 2211   vasopressin  (PITRESSIN) 20 Units in 100 mL (0.2 unit/mL) infusion-*FOR SHOCK*  0-0.03 Units/min Intravenous Continuous Rolan Ezra RAMAN, MD   Stopped at 03/10/24 1758     Objective: Vital signs in last 24 hours: Temp:  [98.6 F (37 C)-99.7 F (37.6 C)] 98.6 F (37 C) (11/28 1500) Pulse Rate:  [86-122] 98 (11/28 1800) Resp:  [11-47] 22 (11/28 1800) BP: (79-118)/(58-95) 110/66 (11/28 1749) SpO2:  [90 %-97 %] 93 % (11/28 1800) Arterial Line BP: (96-132)/(46-106) 109/106 (11/28 0815) Weight:  [85.9 kg] 85.9 kg (11/28 0437)  Intake/Output from previous day: 11/27 0701 - 11/28 0700 In: 1394.1 [P.O.:480; I.V.:569.2; IV Piggyback:114.1] Out: 4276 [Urine:4276] Intake/Output this shift: Total I/O In: 237.9 [I.V.:127.2; Other:110.7] Out: 970 [Urine:970]   Physical Exam Vitals reviewed.  Constitutional:      Appearance: Normal appearance.  Genitourinary:    Comments: Urine bloody but transparent in foley tubing. .  Neurological:     Mental Status: He is alert.     Lab Results:  Recent Labs    03/11/24 0450 03/11/24 1805  WBC 8.4 9.2  HGB 10.0* 9.3*  HCT 29.4* 28.4*  PLT 177 200   BMET Recent Labs    03/11/24 0450 03/11/24 1805  NA 136 136  K 3.6 3.7  CL 100 104  CO2 24 24  GLUCOSE 112* 131*  BUN 40* 36*  CREATININE 1.66* 1.58*  CALCIUM  7.7* 7.6*   PT/INR Recent Labs    03/09/24 0450  LABPROT 15.6*  INR 1.2   ABG Recent Labs    03/09/24 0438  PHART 7.433  HCO3 25.9    Studies/Results: US  EKG SITE RITE Result Date: 03/11/2024 If Site Rite image not attached, placement could not be confirmed due to current cardiac rhythm.  DG Chest Port 1 View Result Date: 03/11/2024 EXAM: 1 VIEW(S) XRAY OF THE CHEST 03/11/2024 05:19:00 AM COMPARISON: Portable chest dated  03/10/2024 05:30 AM. CLINICAL HISTORY: 142230 Pleural effusion [id number] Pleural effusion FINDINGS: LINES, TUBES AND DEVICES: Right IJ pulmonary artery catheter has been removed with introducer sheath remaining in place terminating  in the upper SVC. Right subclavian impella device positioning is stable. Multiple overlying monitor leads. LUNGS AND PLEURA: No focal pulmonary opacity. There is mild perihilar vascular congestion. There is mild interstitial edema which has markedly improved. There are small pleural effusions, which also appear to have improved. No pneumothorax. HEART AND MEDIASTINUM: Stable mild cardiomegaly. Mediastinum is stable with aortic atherosclerosis. BONES AND SOFT TISSUES: No acute osseous abnormality. IMPRESSION: 1. Mild perihilar vascular congestion and interstitial edema, markedly improved. 2. Small pleural effusions, improved. 3. Stable mild cardiomegaly. Electronically signed by: Francis Quam MD 03/11/2024 06:22 AM EST RP Workstation: [id number]     Assessment and Plan: He failed his voiding trial and has a foley back. The urine is bloody but clearing.  Continue foley drainage, will probably need to be discharged with the foley for a voiding trial in the office in a couple of weeks.      LOS: 8 days    Norleen Seltzer 03/11/2024

## 2024-03-11 NOTE — Progress Notes (Addendum)
 Patient ID: Timothy Nicholson, male   DOB: [birth year], 73 y.o.   MRN: [id number]     Advanced Heart Failure Rounding Note  Cardiologist: None  Chief Complaint: AMI CS   Patient Profile:    73 y/o male admitted w/ acute anterior STEMI c/b CS requiring Impella CP support, further c/b + RPB/hemorrhagic shock. Given ongoing support needs and inability to wean off chemical inotrope/pressor support, was upgraded to Impella 5.5 on 11/25.   Subjective:    POD #3, s/p Impella 5.5  Impella 5.5 at P-7 with 4L flow. Waveforms ok   Still with some hematuria. Foley had to be replaced.   Feels good this morning, just some soreness around impella site.   Objective:   Weight Range: 85.9 kg Body mass index is 27.17 kg/m.   Vital Signs:   Temp:  [98.3 F (36.8 C)-99.7 F (37.6 C)] 99.7 F (37.6 C) (11/28 0700) Pulse Rate:  [79-100] 98 (11/28 0815) Resp:  [11-34] 27 (11/28 0815) BP: (94-135)/(66-95) 115/71 (11/28 0815) SpO2:  [90 %-98 %] 95 % (11/28 0815) Arterial Line BP: (96-138)/(46-106) 109/106 (11/28 0815) Weight:  [85.9 kg] 85.9 kg (11/28 0437) Last BM Date : 03/08/24  Weight change: Filed Weights   03/09/24 0500 03/10/24 0500 03/11/24 0437  Weight: 86.6 kg 86.2 kg 85.9 kg    Intake/Output:   Intake/Output Summary (Last 24 hours) at 03/11/2024 0834 Last data filed at 03/11/2024 0800 Gross per 24 hour  Intake 1142.96 ml  Output 4471 ml  Net -3328.04 ml    Physical Exam  General:  well/elderly appearing.  No respiratory difficulty Neck: JVD UTA. Introducer RIJ.  Cor: Regular rate & rhythm. No murmurs. Impella site stable.  Lungs: clear Extremities: no edema GU: + coude Neuro: alert & oriented x 3. Affect pleasant.   Telemetry   NSR 90s,Personally reviewed  Labs    CBC Recent Labs    03/10/24 0505 03/11/24 0450  WBC 7.5 8.4  HGB 9.5* 10.0*  HCT 27.5* 29.4*  MCV 89.6 90.7  PLT 121* 177   Basic Metabolic Panel Recent Labs    88/72/74 0505 03/10/24 1430  03/11/24 0450  NA 133* 133* 136  K 3.2* 3.5 3.6  CL 101 98 100  CO2 23 23 24   GLUCOSE 105* 112* 112*  BUN 43* 43* 40*  CREATININE 1.59* 1.53* 1.66*  CALCIUM  7.3* 7.3* 7.7*  MG 1.9  --  2.2   Liver Function Tests Recent Labs    03/09/24 0450  AST 35  ALT 33  ALKPHOS 42  BILITOT 1.5*  PROT 5.1*  ALBUMIN  2.4*   No results for input(s): LIPASE, AMYLASE in the last 72 hours. Cardiac Enzymes No results for input(s): CKTOTAL, CKMB, CKMBINDEX, TROPONINI in the last 72 hours.  BNP: BNP (last 3 results) No results for input(s): BNP in the last 8760 hours.  ProBNP (last 3 results) No results for input(s): PROBNP in the last 8760 hours.   D-Dimer No results for input(s): DDIMER in the last 72 hours. Hemoglobin A1C No results for input(s): HGBA1C in the last 72 hours.  Fasting Lipid Panel No results for input(s): CHOL, HDL, LDLCALC, TRIG, CHOLHDL, LDLDIRECT in the last 72 hours.  Thyroid Function Tests No results for input(s): TSH, T4TOTAL, T3FREE, THYROIDAB in the last 72 hours.  Invalid input(s): FREET3  Other results:   Imaging    US  EKG SITE RITE Result Date: 03/11/2024 If Site Rite image not attached, placement could not be confirmed due to current  cardiac rhythm.  DG Chest Port 1 View Result Date: 03/11/2024 EXAM: 1 VIEW(S) XRAY OF THE CHEST 03/11/2024 05:19:00 AM COMPARISON: Portable chest dated 03/10/2024 05:30 AM. CLINICAL HISTORY: 142230 Pleural effusion 142230 Pleural effusion FINDINGS: LINES, TUBES AND DEVICES: Right IJ pulmonary artery catheter has been removed with introducer sheath remaining in place terminating in the upper SVC. Right subclavian impella device positioning is stable. Multiple overlying monitor leads. LUNGS AND PLEURA: No focal pulmonary opacity. There is mild perihilar vascular congestion. There is mild interstitial edema which has markedly improved. There are small pleural effusions, which also  appear to have improved. No pneumothorax. HEART AND MEDIASTINUM: Stable mild cardiomegaly. Mediastinum is stable with aortic atherosclerosis. BONES AND SOFT TISSUES: No acute osseous abnormality. IMPRESSION: 1. Mild perihilar vascular congestion and interstitial edema, markedly improved. 2. Small pleural effusions, improved. 3. Stable mild cardiomegaly. Electronically signed by: Francis Quam MD 03/11/2024 06:22 AM EST RP Workstation: [id number]        Medications:     Scheduled Medications:  aspirin  EC  81 mg Oral Daily   Chlorhexidine  Gluconate Cloth  6 each Topical Daily   feeding supplement  237 mL Oral TID BM   finasteride   5 mg Oral Daily   pantoprazole   40 mg Oral Daily   polyethylene glycol  17 g Oral Daily   QUEtiapine   25 mg Oral QHS   rosuvastatin   20 mg Oral Daily   senna-docusate  2 tablet Oral QHS   sodium chloride  flush  3 mL Intravenous Q12H   tamsulosin   0.4 mg Oral Daily   ticagrelor   90 mg Oral BID   traZODone   50 mg Oral QHS    Infusions:  amiodarone  30 mg/hr (03/11/24 0800)   DOBUTamine  4 mcg/kg/min (03/11/24 0800)   norepinephrine  (LEVOPHED ) Adult infusion Stopped (03/10/24 1056)   promethazine  (PHENERGAN ) injection (IM or IVPB) Stopped (03/03/24 2233)   sodium bicarbonate  25 mEq (Impella PURGE) in dextrose  5 % 1000 mL bag     sodium chloride  irrigation     vasopressin  Stopped (03/10/24 1758)    PRN Medications: acetaminophen , HYDROmorphone  (DILAUDID ) injection, melatonin, ondansetron  (ZOFRAN ) IV, mouth rinse, oxyCODONE , promethazine  (PHENERGAN ) injection (IM or IVPB), simethicone , sodium chloride  flush   Assessment/Plan   1. CAD: Anterior STEMI with occluded proximal LAD; the RCA was not selectively engaged but appeared to be relatively small with proximal occlusion. Now s/p DES to proximal LAD and PTCA to ostial D1.  - no s/s angina - continue ASA/statin/Brilinta  - management of post MI shock as below 2. Cardiogenic shock: Due to anterior MI.   Limited echo with Impella CP showed EF 25% with LAD territory akinesis, normal RV size/mildly decreased systolic function. Now w/ Impella 5.5 to give extended time for recovery vs bridge to durable LVAD.  - Impella 5.5 on P-7 with 4L flow. Good waveforms   - Now off NE and VP. Continue DBA 4, MSBP goal 100. Wean DBA to 3 today.  - Appears euvolemic on exam.  - Introducer in place. Place PICC 3. AKI on CKD stage 3: Creatinine 1.6 => 2.1 -> 2.3 -> ->1.7 -> 1.59> 1.66 - Due to ATN. Improving 4. VT: Peri-STEMI. - VT quiescent  - Continue amiodarone  gtt 30 mg/hr for now (while on DBA) 5. Heme: Hemorrhagic component to shock with hematoma at Impella access site and RP hematoma + hematuria.  CTA showed RP hematoma without active extravasation.  - No heparin  as above (HCO3 purge).  - hgb 10 today today. Transfuse Hgb <  8.  - follow CBC  6. Pulmonary: Possible aspiration PNA.  - completed 5 day course of Unasyn .  7. Hematuria - Urology managing s/p fulguration of bladder  - Now with Coude. CBI stopped yesterday and hematuria persisted.  - continue to hold heparin  8. Thrombocytopenia - trending back up, 63>>92K > 121 >177 today  - follow CBC  9. Illeus: - resolving 10. Retroperitoneal hematoma - stable  CRITICAL CARE Performed by: Beckey LITTIE Coe  Total critical care time: 12 minutes  Critical care time was exclusive of separately billable procedures and treating other patients.  Critical care was necessary to treat or prevent imminent or life-threatening deterioration.  Critical care was time spent personally by me on the following activities: development of treatment plan with patient and/or surrogate as well as nursing, discussions with consultants, evaluation of patient's response to treatment, examination of patient, obtaining history from patient or surrogate, ordering and performing treatments and interventions, ordering and review of laboratory studies, ordering and review of  radiographic studies, pulse oximetry and re-evaluation of patient's condition.    Length of Stay: 8  Beckey LITTIE Coe, NP  03/11/2024, 8:34 AM  Advanced Heart Failure Team Pager [phone] (M-F; 7a - 5p)  Please contact [facility] for night-coverage after hours (5p -7a ) and weekends on [email or web address]   Agree with above.   Now off NE and VP on DBA 4 and Impella 5.5 at P-7.    Outputs good. Waveforms on device look good.   Had to have Foley replaced last night for urinary retention. Still with hematuria  General:  Sitting up in bed. No resp difficulty HEENT: normal Neck: supple. RIJ introducer Cor: Regular rate & rhythm. No rubs, gallops or murmurs. Impella site ok  Lungs: clear Abdomen: soft, nontender, nondistended.Good bowel sounds. Extremities: no cyanosis, clubbing, rash, tr edema + Foley Neuro: alert & orientedx3, cranial nerves grossly intact. moves all 4 extremities w/o difficulty. Affect pleasant  Wean DBA to 2.5. Start low-dose digoxin and spiro.   Place PICC.   Plan repeat echo tomorrow. Hopefully can begin to wean Impella soon.   Urology assisting with hematuria. (Thanks)   Continue to ambulate.   No heparin    CRITICAL CARE Performed by: Cherrie Sieving  Total critical care time: 44 minutes  Critical care time was exclusive of separately billable procedures and treating other patients.  Critical care was necessary to treat or prevent imminent or life-threatening deterioration.  Critical care was time spent personally by me (independent of midlevel providers or residents) on the following activities: development of treatment plan with patient and/or surrogate as well as nursing, discussions with consultants, evaluation of patient's response to treatment, examination of patient, obtaining history from patient or surrogate, ordering and performing treatments and interventions, ordering and review of laboratory studies, ordering and review of radiographic studies,  pulse oximetry and re-evaluation of patient's condition.  Sieving Cherrie, MD  9:10 AM

## 2024-03-11 NOTE — Evaluation (Signed)
 Occupational Therapy Evaluation Patient Details Name: Timothy Nicholson MRN: [id number] DOB: [birth year] Today's Date: 03/11/2024   History of Present Illness   73 yo M adm 03/03/24 with chest pain, STEMI, emergent LHC with PCI of LAD, hypotensive, Impella CP. Cardiogenic shock 11/25 with impella 5.5 insertion and cystoscopy for hematuria. Intubated 11/25-11/26. PMhx: HTN, HLD, BPH, CKD     Clinical Impressions Pt admitted based on above, and was seen based on problem list below. PTA pt was independent with ADLs and IADLs. Today pt is requiring set up  to min  assist for ADLs. Functional transfers are  CGA for balance with RW. Pt limited by decreased activity tolerance, but anticipate will progress well  no follow up OT needs. Pt would benefit from DME below to promote energy conservation in the home setting. OT will continue to follow acutely to maximize functional independence.      If plan is discharge home, recommend the following:   A little help with walking and/or transfers;A little help with bathing/dressing/bathroom;Assistance with cooking/housework;Assist for transportation     Functional Status Assessment   Patient has had a recent decline in their functional status and demonstrates the ability to make significant improvements in function in a reasonable and predictable amount of time.     Equipment Recommendations   BSC/3in1;Tub/shower seat      Precautions/Restrictions   Precautions Precautions: Other (comment);Fall Recall of Precautions/Restrictions: Intact Precaution/Restrictions Comments: impella Restrictions Weight Bearing Restrictions Per Provider Order: No     Mobility Bed Mobility Overal bed mobility: Needs Assistance Bed Mobility: Supine to Sit     Supine to sit: Supervision     General bed mobility comments: S for safety and line management    Transfers Overall transfer level: Needs assistance Equipment used: Rolling walker (2  wheels) Transfers: Sit to/from Stand Sit to Stand: Contact guard assist, +2 safety/equipment           General transfer comment: Cues for hand placement and to limit use of RUE, CGA for functional mobility. +2 for safety with lines      Balance Overall balance assessment: Mild deficits observed, not formally tested         ADL either performed or assessed with clinical judgement   ADL Overall ADL's : Needs assistance/impaired Eating/Feeding: Set up;Sitting   Grooming: Oral care;Wash/dry face;Sitting           Upper Body Dressing : Set up;Sitting   Lower Body Dressing: Minimal assistance;Sit to/from stand Lower Body Dressing Details (indicate cue type and reason): Assist for socks Toilet Transfer: Contact guard assist;Rolling walker (2 wheels);Ambulation Toilet Transfer Details (indicate cue type and reason): CGA for balance         Functional mobility during ADLs: Contact guard assist;Rolling walker (2 wheels) General ADL Comments: Limited by decreased activity tolerance     Vision Baseline Vision/History: 1 Wears glasses Patient Visual Report: No change from baseline Vision Assessment?: No apparent visual deficits            Pertinent Vitals/Pain Pain Assessment Pain Assessment: Faces Faces Pain Scale: Hurts little more Pain Location: catheter Pain Descriptors / Indicators: Discomfort, Grimacing Pain Intervention(s): Limited activity within patient's tolerance     Extremity/Trunk Assessment Upper Extremity Assessment Upper Extremity Assessment: Generalized weakness   Lower Extremity Assessment Lower Extremity Assessment: Defer to PT evaluation   Cervical / Trunk Assessment Cervical / Trunk Assessment: Normal   Communication Communication Communication: No apparent difficulties   Cognition Arousal: Alert Behavior During Therapy: [facility]  for tasks assessed/performed       Following commands: Intact       Cueing  General Comments   Cueing  Techniques: Verbal cues  VSS on RA           Home Living Family/patient expects to be discharged to:: Private residence Living Arrangements: Spouse/significant other Available Help at Discharge: Family;Available 24 hours/day Type of Home: House Home Access: Stairs to enter [employer] of Steps: 2   Home Layout: One level     Bathroom Shower/Tub: Chief Strategy Officer: Standard     Home Equipment: None          Prior Functioning/Environment Prior Level of Function : Independent/Modified Independent;Working/employed;Driving             Mobility Comments: Ind, no AD ADLs Comments: Ind    OT Problem List: Decreased strength;Decreased range of motion;Decreased activity tolerance;Impaired balance (sitting and/or standing);Decreased safety awareness;Decreased knowledge of use of DME or AE;Decreased knowledge of precautions;Cardiopulmonary status limiting activity   OT Treatment/Interventions: Self-care/ADL training;Therapeutic exercise;Energy conservation;DME and/or AE instruction;Therapeutic activities;Patient/family education;Balance training      OT Goals(Current goals can be found in the care plan section)   Acute Rehab OT Goals Patient Stated Goal: To get catheter removed OT Goal Formulation: With patient Time For Goal Achievement: 03/25/24 Potential to Achieve Goals: Good   OT Frequency:  Min 2X/week       AM-PAC OT 6 Clicks Daily Activity     Outcome Measure Help from another person eating meals?: None Help from another person taking care of personal grooming?: A Little Help from another person toileting, which includes using toliet, bedpan, or urinal?: A Little Help from another person bathing (including washing, rinsing, drying)?: A Little Help from another person to put on and taking off regular upper body clothing?: A Little Help from another person to put on and taking off regular lower body clothing?: A Little 6 Click  Score: 19   End of Session Equipment Utilized During Treatment: Gait belt;Rolling walker (2 wheels) Nurse Communication: Mobility status  Activity Tolerance: Patient tolerated treatment well Patient left: in chair;with call bell/phone within reach;with family/visitor present  OT Visit Diagnosis: Unsteadiness on feet (R26.81);Other abnormalities of gait and mobility (R26.89);Muscle weakness (generalized) (M62.81)                Time: 1029-1101 OT Time Calculation (min): 32 min Charges:  OT General Charges $OT Visit: 1 Visit OT Evaluation $OT Eval Moderate Complexity: 1 Mod OT Treatments $Self Care/Home Management : 8-22 mins  Timothy Nicholson, OT  Acute Rehabilitation Services Office [phone] Secure chat preferred   Timothy Nicholson Savers 03/11/2024, 12:39 PM

## 2024-03-11 NOTE — Progress Notes (Signed)
 [facility] ADULT ICU REPLACEMENT PROTOCOL   The patient does apply for the [facility] Adult ICU Electrolyte Replacment Protocol based on the criteria listed below:   1.Exclusion criteria: TCTS, ECMO, Dialysis, and Myasthenia Gravis patients 2. Is GFR >/= 30 ml/min? Yes.    Patient's GFR today is 43 3. Is SCr </= 2? Yes.   Patient's SCr is 1.66 mg/dL 4. Did SCr increase >/= 0.5 in 24 hours? No. 5.Pt's weight >40kg  Yes.   6. Abnormal electrolyte(s): K+ = 3.6  7. Electrolytes replaced per protocol 8.  Call MD STAT for K+ </= 2.5, Phos </= 1, or Mag </= 1 Physician:  Fate, eMD   Rosina LOISE Hamilton 03/11/2024 5:41 AM

## 2024-03-11 NOTE — Progress Notes (Signed)
 3 Days Post-Op Procedure(s) (LRB): INSERTION, CARDIAC ASSIST DEVICE, IMPELLA (N/A) ECHOCARDIOGRAM, TRANSESOPHAGEAL, INTRAOPERATIVE (N/A) CYSTOSCOPY CLOT EVACUATION AND FULGURATION Subjective:  No specific complaints. Hemodynamics have been stable on dobut 4 with Co-ox 76.  Objective: Vital signs in last 24 hours: Temp:  [98.3 F (36.8 C)-99.5 F (37.5 C)] 98.6 F (37 C) (11/28 0400) Pulse Rate:  [79-99] 91 (11/28 0700) Cardiac Rhythm: Normal sinus rhythm (11/28 0400) Resp:  [11-34] 17 (11/28 0700) BP: (94-135)/(66-79) 110/68 (11/28 0700) SpO2:  [90 %-98 %] 94 % (11/28 0700) Arterial Line BP: (102-138)/(46-65) 105/54 (11/28 0400) Weight:  [85.9 kg] 85.9 kg (11/28 0437)  Hemodynamic parameters for last 24 hours: PAP: (25-66)/(7-48) 28/8 CVP:  [1 mmHg-35 mmHg] 1 mmHg CO:  [6.1 L/min-6.5 L/min] 6.5 L/min CI:  [2.9 L/min/m2-3.12 L/min/m2] 3.12 L/min/m2  Intake/Output from previous day: 11/27 0701 - 11/28 0700 In: 1394.1 [P.O.:480; I.V.:569.2; IV Piggyback:114.1] Out: 4276 [Urine:4276] Intake/Output this shift: No intake/output data recorded.  General appearance: alert and cooperative Neurologic: intact Wound: Impella incision is fine. No significant hematoma. Dressing dry. Drain is out.  Lab Results: Recent Labs    03/10/24 0505 03/11/24 0450  WBC 7.5 8.4  HGB 9.5* 10.0*  HCT 27.5* 29.4*  PLT 121* 177   BMET:  Recent Labs    03/10/24 1430 03/11/24 0450  NA 133* 136  K 3.5 3.6  CL 98 100  CO2 23 24  GLUCOSE 112* 112*  BUN 43* 40*  CREATININE 1.53* 1.66*  CALCIUM  7.3* 7.7*    PT/INR:  Recent Labs    03/09/24 0450  LABPROT 15.6*  INR 1.2   ABG    Component Value Date/Time   PHART 7.433 03/09/2024 0438   HCO3 25.9 03/09/2024 0438   TCO2 27 03/09/2024 0438   ACIDBASEDEF 2.0 03/08/2024 1723   O2SAT 76 03/11/2024 0451   CBG (last 3)  Recent Labs    03/10/24 0752 03/10/24 1135 03/10/24 1527  GLUCAP 111* 103* 100*    Assessment/Plan: S/P  Procedure(s) (LRB): INSERTION, CARDIAC ASSIST DEVICE, IMPELLA (N/A) ECHOCARDIOGRAM, TRANSESOPHAGEAL, INTRAOPERATIVE (N/A) CYSTOSCOPY CLOT EVACUATION AND FULGURATION  Impella site stable. Change dressing as needed. Plans per AHF and CCM teams.   LOS: 8 days    Dorise MARLA Fellers 03/11/2024

## 2024-03-12 ENCOUNTER — Inpatient Hospital Stay (HOSPITAL_COMMUNITY)

## 2024-03-12 DIAGNOSIS — I5021 Acute systolic (congestive) heart failure: Secondary | ICD-10-CM | POA: Diagnosis not present

## 2024-03-12 DIAGNOSIS — I2102 ST elevation (STEMI) myocardial infarction involving left anterior descending coronary artery: Secondary | ICD-10-CM | POA: Diagnosis not present

## 2024-03-12 DIAGNOSIS — R570 Cardiogenic shock: Secondary | ICD-10-CM | POA: Diagnosis not present

## 2024-03-12 LAB — ECHOCARDIOGRAM LIMITED
Height: 70 in
Single Plane A4C EF: 41.8 %
Weight: 2994.73 [oz_av]

## 2024-03-12 LAB — BASIC METABOLIC PANEL WITH GFR
Anion gap: 8 (ref 5–15)
BUN: 32 mg/dL — ABNORMAL HIGH (ref 8–23)
CO2: 25 mmol/L (ref 22–32)
Calcium: 7.8 mg/dL — ABNORMAL LOW (ref 8.9–10.3)
Chloride: 106 mmol/L (ref 98–111)
Creatinine, Ser: 1.51 mg/dL — ABNORMAL HIGH (ref 0.61–1.24)
GFR, Estimated: 48 mL/min — ABNORMAL LOW (ref >60)
Glucose, Bld: 99 mg/dL (ref 70–99)
Potassium: 4.1 mmol/L (ref 3.5–5.1)
Sodium: 139 mmol/L (ref 135–145)

## 2024-03-12 LAB — CBC
HCT: 27.3 % — ABNORMAL LOW (ref 39.0–52.0)
HCT: 29 % — ABNORMAL LOW (ref 39.0–52.0)
Hemoglobin: 8.7 g/dL — ABNORMAL LOW (ref 13.0–17.0)
Hemoglobin: 9.5 g/dL — ABNORMAL LOW (ref 13.0–17.0)
MCH: 30.5 pg (ref 26.0–34.0)
MCH: 30.5 pg (ref 26.0–34.0)
MCHC: 31.9 g/dL (ref 30.0–36.0)
MCHC: 32.8 g/dL (ref 30.0–36.0)
MCV: 95.8 fL (ref 80.0–100.0)
MCV: 93.2 fL (ref 80.0–100.0)
Platelets: 197 K/uL (ref 150–400)
Platelets: 234 K/uL (ref 150–400)
RBC: 2.85 MIL/uL — ABNORMAL LOW (ref 4.22–5.81)
RBC: 3.11 MIL/uL — ABNORMAL LOW (ref 4.22–5.81)
RDW: 15.3 % (ref 11.5–15.5)
RDW: 15.3 % (ref 11.5–15.5)
WBC: 8.5 K/uL (ref 4.0–10.5)
WBC: 10.4 K/uL (ref 4.0–10.5)
nRBC: 0 % (ref 0.0–0.2)
nRBC: 0 % (ref 0.0–0.2)

## 2024-03-12 LAB — MAGNESIUM: Magnesium: 2 mg/dL (ref 1.7–2.4)

## 2024-03-12 LAB — COOXEMETRY PANEL
Carboxyhemoglobin: 3.2 % — ABNORMAL HIGH (ref 0.5–1.5)
Methemoglobin: <0.7 % (ref 0.0–1.5)
O2 Saturation: 77.3 %
Total hemoglobin: 8.6 g/dL — ABNORMAL LOW (ref 12.0–16.0)

## 2024-03-12 LAB — LACTATE DEHYDROGENASE: LDH: 450 U/L — ABNORMAL HIGH (ref 105–235)

## 2024-03-12 MED ORDER — FUROSEMIDE 10 MG/ML IJ SOLN
40.0000 mg | Freq: Two times a day (BID) | INTRAMUSCULAR | Status: AC
  Administered 2024-03-12 (×2): 40 mg via INTRAVENOUS
  Filled 2024-03-12 (×2): qty 4

## 2024-03-12 MED ORDER — SODIUM CHLORIDE 0.9 % IV SOLN: INTRAVENOUS | Status: AC | PRN

## 2024-03-12 MED ORDER — POTASSIUM CHLORIDE CRYS ER 20 MEQ PO TBCR
40.0000 meq | EXTENDED_RELEASE_TABLET | Freq: Once | ORAL | Status: AC
  Administered 2024-03-12: 40 meq via ORAL
  Filled 2024-03-12: qty 2

## 2024-03-12 MED ORDER — SODIUM CHLORIDE 0.9% FLUSH: 10.0000 mL | Freq: Two times a day (BID) | INTRAVENOUS | Status: DC

## 2024-03-12 MED ORDER — SODIUM CHLORIDE 0.9% FLUSH: 10.0000 mL | INTRAVENOUS | Status: DC | PRN

## 2024-03-12 NOTE — Progress Notes (Signed)
 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  This morning had significant hematuria again.  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) Active Problems:   Cardiogenic shock (HCC)  Current Facility-Administered Medications  Medication Dose Route Frequency Provider Last Rate Last Admin   acetaminophen  (TYLENOL ) tablet 650 mg  650 mg Oral Q4H PRN Cooper, Michael, MD   650 mg at 03/07/24 1208   amiodarone  (PACERONE ) tablet 200 mg  200 mg Oral Daily Bensimhon, Daniel R, MD   200 mg at 03/11/24 1029   aspirin  EC tablet 81 mg  81 mg Oral Daily McLean, Dalton S, MD   81 mg at 03/11/24 9092   Chlorhexidine  Gluconate Cloth 2 % PADS 6 each  6 each Topical Daily Rolan Ezra RAMAN, MD   6 each at 03/11/24 1030   digoxin (LANOXIN) tablet 0.0625 mg  0.0625 mg Oral Daily Bensimhon, Daniel R, MD   0.0625 mg at 03/11/24 1029   DOBUTamine  (DOBUTREX ) infusion 4000 mcg/mL  2.5 mcg/kg/min (Order-Specific) Intravenous Continuous Bensimhon, Toribio SAUNDERS, MD 2.98 mL/hr at 03/12/24 0900 2.5 mcg/kg/min at 03/12/24 0900   feeding supplement (ENSURE PLUS HIGH PROTEIN) liquid 237 mL  237 mL Oral TID BM McLean, Dalton S, MD   237 mL at 03/11/24 2000   finasteride  (PROSCAR ) tablet 5 mg  5 mg Oral Daily Colletta Manuelita Garre, PA-C   5 mg at 03/11/24 9091   HYDROmorphone  (DILAUDID ) injection 0.5 mg  0.5 mg Intravenous Q4H PRN Gretta Doffing P, DO       melatonin tablet 3 mg  3 mg Oral QHS PRN Debarah Donley SAILOR, MD   3 mg at 03/04/24 2035   norepinephrine  (LEVOPHED ) 4mg  in (0.016 mg/mL) premix infusion  0-40 mcg/min Intravenous Titrated Bensimhon, Toribio SAUNDERS, MD   Stopped at 03/10/24  1056   ondansetron  (ZOFRAN ) injection 4 mg  4 mg Intravenous Q6H PRN Colletta Manuelita Garre, PA-C   4 mg at 03/07/24 9192   Oral care mouth rinse  15 mL Mouth Rinse PRN Rolan Ezra RAMAN, MD       oxyCODONE  (Oxy IR/ROXICODONE ) immediate release tablet 5-10 mg  5-10 mg Oral Q4H PRN Cooper, Michael, MD   5 mg at 03/10/24 1227   pantoprazole  (PROTONIX ) EC tablet 40 mg  40 mg Oral Daily Colletta Manuelita Garre, PA-C   40 mg at 03/11/24 9092   polyethylene glycol (MIRALAX  / GLYCOLAX ) packet 17 g  17 g Oral Daily Bensimhon, Toribio SAUNDERS, MD   17 g at 03/11/24 0908   promethazine  (PHENERGAN ) 12.5 mg in sodium chloride  0.9 % 50 mL IVPB  12.5 mg Intravenous Q6H PRN McLean, Dalton S, MD   Paused at 03/03/24 2233   QUEtiapine  (SEROQUEL ) tablet 25 mg  25 mg Oral QHS Claudene Toribio BROCKS, MD   25 mg at 03/11/24 2205   rosuvastatin  (CRESTOR ) tablet 20 mg  20 mg Oral Daily Cooper, Michael, MD   20 mg at 03/11/24 0908   senna-docusate (Senokot-S) tablet 2 tablet  2 tablet Oral QHS Bensimhon, Daniel R, MD   2 tablet at 03/11/24 2205   simethicone  (MYLICON) chewable tablet 80 mg  80 mg Oral QID PRN Claudene Toribio BROCKS, MD   80 mg at 03/07/24 1610   sodium bicarbonate   25 mEq (Impella PURGE) in dextrose  5 % 1000 mL bag   Intracatheter Continuous Wonda Sharper, MD   New Bag at 03/12/24 [phone]   sodium chloride  flush (NS) 0.9 % injection 3 mL  3 mL Intravenous Q12H Wonda Sharper, MD   3 mL at 03/11/24 2200   sodium chloride  flush (NS) 0.9 % injection 3 mL  3 mL Intravenous PRN Wonda Sharper, MD       sodium chloride  irrigation 0.9 % 3,000 mL  3,000 mL Irrigation Continuous Smith, Daniel C, MD   3,000 mL at 03/09/24 0421   spironolactone  (ALDACTONE ) tablet 12.5 mg  12.5 mg Oral Daily Bensimhon, Daniel R, MD   12.5 mg at 03/11/24 1029   tamsulosin  (FLOMAX ) capsule 0.4 mg  0.4 mg Oral Daily Colletta Manuelita Garre, PA-C   0.4 mg at 03/11/24 9092   ticagrelor  (BRILINTA ) tablet 90 mg  90 mg Oral BID McLean, Dalton S, MD   90 mg at 03/11/24  2205   traZODone  (DESYREL ) tablet 50 mg  50 mg Oral QHS Claudene Toribio BROCKS, MD   50 mg at 03/11/24 2205   vasopressin  (PITRESSIN) 20 Units in 100 mL (0.2 unit/mL) infusion-*FOR SHOCK*  0-0.03 Units/min Intravenous Continuous Rolan Ezra RAMAN, MD   Stopped at 03/10/24 1758     Objective: Vital: Vitals:   03/12/24 0700 03/12/24 0732 03/12/24 0800 03/12/24 0827  BP: 102/74 105/69 103/71   Pulse: 97 94 93 94  Resp: (!) 22 19 15 20   Temp:    98.5 F (36.9 C)  TempSrc:    Oral  SpO2: 95% 94% 94% 95%  Weight:      Height:       I/Os: I/O last 3 completed shifts: In: 990 [P.O.:200; I.V.:407; Other:383] Out: 3916 [Urine:3916]  Physical Exam:  General: Patient is in no apparent distress Lungs: Normal respiratory effort, chest expands symmetrically. GI: The abdomen is soft and nontender without mass. Foley: Draining dark red urine Ext: lower extremities symmetric  Lab Results: Recent Labs    03/11/24 0450 03/11/24 1805 03/12/24 0505  WBC 8.4 9.2 8.5  HGB 10.0* 9.3* 8.7*  HCT 29.4* 28.4* 27.3*   Recent Labs    03/11/24 0450 03/11/24 1805 03/12/24 0505  NA 136 136 139  K 3.6 3.7 4.1  CL 100 104 106  CO2 24 24 25   GLUCOSE 112* 131* 99  BUN 40* 36* 32*  CREATININE 1.66* 1.58* 1.51*  CALCIUM  7.7* 7.6* 7.8*   No results for input(s): LABPT, INR in the last 72 hours. No results for input(s): LABURIN in the last 72 hours. Results for orders placed or performed during the hospital encounter of 03/03/24  MRSA Next Gen by PCR, Nasal     Status: None   Collection Time: 03/03/24  9:37 PM   Specimen: Nasal Mucosa; Nasal Swab  Result Value Ref Range Status   MRSA by PCR Next Gen NOT DETECTED NOT DETECTED Final    Comment: (NOTE) The GeneXpert MRSA Assay (FDA approved for NASAL specimens only), is one component of a comprehensive MRSA colonization surveillance program. It is not intended to diagnose MRSA infection nor to guide or monitor treatment for MRSA  infections. Test performance is not FDA approved in patients less than 36 years old. Performed at [facility], [address] [place], [place] [address]     Studies/Results: US  EKG SITE RITE Result Date: 03/11/2024 If Site Rite image not attached, placement could not be confirmed due to current cardiac  rhythm.  DG Chest Port 1 View Result Date: 03/11/2024 EXAM: 1 VIEW(S) XRAY OF THE CHEST 03/11/2024 05:19:00 AM COMPARISON: Portable chest dated 03/10/2024 05:30 AM. CLINICAL HISTORY: [id number] Pleural effusion [id number] Pleural effusion FINDINGS: LINES, TUBES AND DEVICES: Right IJ pulmonary artery catheter has been removed with introducer sheath remaining in place terminating in the upper SVC. Right subclavian impella device positioning is stable. Multiple overlying monitor leads. LUNGS AND PLEURA: No focal pulmonary opacity. There is mild perihilar vascular congestion. There is mild interstitial edema which has markedly improved. There are small pleural effusions, which also appear to have improved. No pneumothorax. HEART AND MEDIASTINUM: Stable mild cardiomegaly. Mediastinum is stable with aortic atherosclerosis. BONES AND SOFT TISSUES: No acute osseous abnormality. IMPRESSION: 1. Mild perihilar vascular congestion and interstitial edema, markedly improved. 2. Small pleural effusions, improved. 3. Stable mild cardiomegaly. Electronically signed by: Francis Quam MD 03/11/2024 06:22 AM EST RP Workstation: [id number]    I removed his Foley catheter.  I prepped his penis and placed a 22 French three-way Foley catheter.  I irrigated the catheter until clear and initiated continuous bladder irrigation.  Assessment: BPH with lower urinary tract symptoms on Flomax  and finasteride  Gross hematuria likely secondary to BPH Retroperitoneal bleed-stable Right angiomyolipoma  Procedure(s): INSERTION, CARDIAC ASSIST DEVICE, IMPELLA ECHOCARDIOGRAM, TRANSESOPHAGEAL, INTRAOPERATIVE CYSTOSCOPY CLOT  EVACUATION AND FULGURATION, 4 Days Post-Op  doing well.  Plan: Continue continuous bladder irrigation and hopefully wean off.  If unable to wean, we will likely repeat a CTA of the abdomen/pelvis and consider prostate embolization.   Sherwood Edison, MD Urology 03/12/2024, 9:19 AM

## 2024-03-12 NOTE — Progress Notes (Signed)
 Patient ID: Timothy Nicholson, male   DOB: 03-Mar-1951, 73 y.o.   MRN: [id number]     Advanced Heart Failure Rounding Note  Cardiologist: None  Chief Complaint: AMI CS   Patient Profile:    73 y/o male admitted w/ acute anterior STEMI c/b CS requiring Impella CP support, further c/b + RPB/hemorrhagic shock. Given ongoing support needs and inability to wean off chemical inotrope/pressor support, was upgraded to Impella 5.5 on 11/25.   Subjective:    POD #4, s/p Impella 5.5  Impella 5.5 at P-7 with 4.1L flow. LVED 2-3 Co-ox 77%  Now off NE and VP. On DBA 2.5.   Continues with hematuria. Urology placing 3-way  Pocus echo today EF 35-40% normal RV. I turned Impella down to p-5 and LVED settled out around 14 with 3.4 L of flow  Objective:   Weight Range: 84.9 kg Body mass index is 26.86 kg/m.   Vital Signs:   Temp:  [98.5 F (36.9 C)-99.4 F (37.4 C)] 98.5 F (36.9 C) (11/29 0827) Pulse Rate:  [86-122] 91 (11/29 1054) Resp:  [11-45] 25 (11/29 1054) BP: (79-122)/(59-102) 122/72 (11/29 1000) SpO2:  [90 %-97 %] 96 % (11/29 1054) Weight:  [84.9 kg] 84.9 kg (11/29 0500) Last BM Date : 03/11/24  Weight change: Filed Weights   03/10/24 0500 03/11/24 0437 03/12/24 0500  Weight: 86.2 kg 85.9 kg 84.9 kg    Intake/Output:   Intake/Output Summary (Last 24 hours) at 03/12/2024 1102 Last data filed at 03/12/2024 1101 Gross per 24 hour  Intake 755.81 ml  Output 1480 ml  Net -724.19 ml    Physical Exam   General:  Sitting up in bed. No resp difficulty HEENT: normal Neck: supple. JVP 10 Cor: Regular rate & rhythm. Impella site ok  Lungs: clear Abdomen: soft, nontender, nondistended.Good bowel sounds. Extremities: no cyanosis, clubbing, rash, 1-2+ edema Neuro: alert & orientedx3, cranial nerves grossly intact. moves all 4 extremities w/o difficulty. Affect pleasant    Telemetry   NSR 90s Personally reviewed  Labs    CBC Recent Labs    03/11/24 1805 03/12/24 0505  WBC  9.2 8.5  HGB 9.3* 8.7*  HCT 28.4* 27.3*  MCV 93.1 95.8  PLT 200 197   Basic Metabolic Panel Recent Labs    88/71/74 0450 03/11/24 1805 03/12/24 0505  NA 136 136 139  K 3.6 3.7 4.1  CL 100 104 106  CO2 24 24 25   GLUCOSE 112* 131* 99  BUN 40* 36* 32*  CREATININE 1.66* 1.58* 1.51*  CALCIUM  7.7* 7.6* 7.8*  MG 2.2  --  2.0   Liver Function Tests No results for input(s): AST, ALT, ALKPHOS, BILITOT, PROT, ALBUMIN  in the last 72 hours.  No results for input(s): LIPASE, AMYLASE in the last 72 hours. Cardiac Enzymes No results for input(s): CKTOTAL, CKMB, CKMBINDEX, TROPONINI in the last 72 hours.  BNP: BNP (last 3 results) No results for input(s): BNP in the last 8760 hours.  ProBNP (last 3 results) No results for input(s): PROBNP in the last 8760 hours.   D-Dimer No results for input(s): DDIMER in the last 72 hours. Hemoglobin A1C No results for input(s): HGBA1C in the last 72 hours.  Fasting Lipid Panel No results for input(s): CHOL, HDL, LDLCALC, TRIG, CHOLHDL, LDLDIRECT in the last 72 hours.  Thyroid Function Tests No results for input(s): TSH, T4TOTAL, T3FREE, THYROIDAB in the last 72 hours.  Invalid input(s): FREET3  Other results:   Imaging    No results  found.       Medications:     Scheduled Medications:  amiodarone   200 mg Oral Daily   aspirin  EC  81 mg Oral Daily   Chlorhexidine  Gluconate Cloth  6 each Topical Daily   digoxin   0.0625 mg Oral Daily   feeding supplement  237 mL Oral TID BM   finasteride   5 mg Oral Daily   furosemide   40 mg Intravenous BID   pantoprazole   40 mg Oral Daily   polyethylene glycol  17 g Oral Daily   QUEtiapine   25 mg Oral QHS   rosuvastatin   20 mg Oral Daily   senna-docusate  2 tablet Oral QHS   sodium chloride  flush  3 mL Intravenous Q12H   spironolactone   12.5 mg Oral Daily   tamsulosin   0.4 mg Oral Daily   ticagrelor   90 mg Oral BID   traZODone   50  mg Oral QHS    Infusions:  DOBUTamine  2.5 mcg/kg/min (03/12/24 1000)   norepinephrine  (LEVOPHED ) Adult infusion Stopped (03/10/24 1056)   promethazine  (PHENERGAN ) injection (IM or IVPB) Stopped (03/03/24 2233)   sodium bicarbonate  25 mEq (Impella PURGE) in dextrose  5 % 1000 mL bag     sodium chloride  irrigation     vasopressin  Stopped (03/10/24 1758)    PRN Medications: acetaminophen , HYDROmorphone  (DILAUDID ) injection, melatonin, ondansetron  (ZOFRAN ) IV, mouth rinse, oxyCODONE , promethazine  (PHENERGAN ) injection (IM or IVPB), simethicone , sodium chloride  flush   Assessment/Plan   1. CAD: Anterior STEMI with occluded proximal LAD; the RCA was not selectively engaged but appeared to be relatively small with proximal occlusion. Now s/p DES to proximal LAD and PTCA to ostial D1.  - no angina - continue ASA/statin/Brilinta  - management of post MI shock as below 2. Cardiogenic shock: Due to anterior MI.  Limited echo with Impella CP showed EF 25% with LAD territory akinesis, normal RV size/mildly decreased systolic function. Now w/ Impella 5.5 to give extended time for recovery vs bridge to durable LVAD.  - Now off NE and VP on DBA 2.5 Co-ox 77% - Impella 5.5 on P-7 with  4.1L flow. Good waveforms LVEDP 2-3 - Pocus echo today EF 35-40% normal RV. I turned Impella down to p-5 and LVED settled out around 14 with 3.4 L of flow - Will keep impella at P-5. Give lasix  40 IV bid - Continue impella wean  - Continue spiro and digoxin   3. AKI on CKD stage 3: Creatinine 1.6 => 2.1 -> 2.3 -> ->1.7 -> 1.59> 1.66 > 1.51 - Due to ATN. Improving 4. VT: Peri-STEMI. - VT quiescent  - Switch to po amio 5. Heme: Hemorrhagic component to shock with hematoma at Impella access site and RP hematoma + hematuria.  CTA showed RP hematoma without active extravasation.  - No heparin  as above (HCO3 purge).  - hgb 8.7 today  Transfuse Hgb < 8.  - follow CBC  6. Pulmonary: Possible aspiration PNA.  - completed 5  day course of Unasyn .  7. Hematuria - Urology managing s/p fulguration of bladder. Replacing 3-way today  - continue to hold heparin  8. Thrombocytopenia - resolved 9. Illeus: - resolved 10. Retroperitoneal hematoma - stable  CRITICAL CARE Performed by: [name]  Total critical care time: 42 minutes  Critical care time was exclusive of separately billable procedures and treating other patients.  Critical care was necessary to treat or prevent imminent or life-threatening deterioration.  Critical care was time spent personally by me on the following activities: development of treatment plan with  patient and/or surrogate as well as nursing, discussions with consultants, evaluation of patient's response to treatment, examination of patient, obtaining history from patient or surrogate, ordering and performing treatments and interventions, ordering and review of laboratory studies, ordering and review of radiographic studies, pulse oximetry and re-evaluation of patient's condition.    Length of Stay: 9  [name], MD  03/12/2024, 11:02 AM  Advanced Heart Failure Team Pager [phone] (M-F; 7a - 5p)  Please contact [facility] for night-coverage after hours (5p -7a ) and weekends on [email or web address]

## 2024-03-12 NOTE — Progress Notes (Signed)
 Peripherally Inserted Central Catheter Placement  The IV Nurse has discussed with the patient and/or persons authorized to consent for the patient, the purpose of this procedure and the potential benefits and risks involved with this procedure.  The benefits include less needle sticks, lab draws from the catheter, and the patient Saavedra be discharged home with the catheter. Risks include, but not limited to, infection, bleeding, blood clot (thrombus formation), and puncture of an artery; nerve damage and irregular heartbeat and possibility to perform a PICC exchange if needed/ordered by physician.  Alternatives to this procedure were also discussed.  Bard Power PICC patient education guide, fact sheet on infection prevention and patient information card has been provided to patient /or left at bedside.    PICC Placement Documentation  PICC Double Lumen 03/12/24 Right Brachial 44 cm 0 cm (Active)  Indication for Insertion or Continuance of Line Vasoactive infusions 03/12/24 1237  Exposed Catheter (cm) 0 cm 03/12/24 1237  Site Assessment Clean, Dry, Intact 03/12/24 1237  Lumen #1 Status Flushed;Saline locked;Blood return noted 03/12/24 1237  Lumen #2 Status Flushed;Saline locked;Blood return noted 03/12/24 1237  Dressing Type Transparent;Securing device 03/12/24 1237  Dressing Status Antimicrobial disc/dressing in place;Clean, Dry, Intact 03/12/24 1237  Line Care Connections checked and tightened 03/12/24 1237  Line Adjustment (NICU/IV Team Only) No 03/12/24 1237  Dressing Intervention New dressing 03/12/24 1237  Dressing Change Due 03/19/24 03/12/24 1237       Veronnica Hennings Sheral Ruth 03/12/2024, 12:39 PM

## 2024-03-12 NOTE — Progress Notes (Signed)
  Echocardiogram 2D Echocardiogram has been performed.  Koleen KANDICE Popper, RDCS 03/12/2024, 11:23 AM

## 2024-03-13 DIAGNOSIS — R570 Cardiogenic shock: Secondary | ICD-10-CM | POA: Diagnosis not present

## 2024-03-13 DIAGNOSIS — I2102 ST elevation (STEMI) myocardial infarction involving left anterior descending coronary artery: Secondary | ICD-10-CM | POA: Diagnosis not present

## 2024-03-13 LAB — BASIC METABOLIC PANEL WITH GFR
Anion gap: 10 (ref 5–15)
BUN: 32 mg/dL — ABNORMAL HIGH (ref 8–23)
CO2: 27 mmol/L (ref 22–32)
Calcium: 7.7 mg/dL — ABNORMAL LOW (ref 8.9–10.3)
Chloride: 100 mmol/L (ref 98–111)
Creatinine, Ser: 1.72 mg/dL — ABNORMAL HIGH (ref 0.61–1.24)
GFR, Estimated: 41 mL/min — ABNORMAL LOW (ref >60)
Glucose, Bld: 104 mg/dL — ABNORMAL HIGH (ref 70–99)
Potassium: 3.9 mmol/L (ref 3.5–5.1)
Sodium: 137 mmol/L (ref 135–145)

## 2024-03-13 LAB — COOXEMETRY PANEL
Carboxyhemoglobin: 3.2 % — ABNORMAL HIGH (ref 0.5–1.5)
Methemoglobin: 0.8 % (ref 0.0–1.5)
O2 Saturation: 75.2 %
Total hemoglobin: 9.4 g/dL — ABNORMAL LOW (ref 12.0–16.0)

## 2024-03-13 LAB — MAGNESIUM: Magnesium: 1.8 mg/dL (ref 1.7–2.4)

## 2024-03-13 LAB — CBC
HCT: 27 % — ABNORMAL LOW (ref 39.0–52.0)
Hemoglobin: 8.8 g/dL — ABNORMAL LOW (ref 13.0–17.0)
MCH: 30.4 pg (ref 26.0–34.0)
MCHC: 32.6 g/dL (ref 30.0–36.0)
MCV: 93.4 fL (ref 80.0–100.0)
Platelets: 243 K/uL (ref 150–400)
RBC: 2.89 MIL/uL — ABNORMAL LOW (ref 4.22–5.81)
RDW: 15.2 % (ref 11.5–15.5)
WBC: 10.6 K/uL — ABNORMAL HIGH (ref 4.0–10.5)
nRBC: 0 % (ref 0.0–0.2)

## 2024-03-13 LAB — LACTATE DEHYDROGENASE: LDH: 402 U/L — ABNORMAL HIGH (ref 105–235)

## 2024-03-13 MED ORDER — MAGNESIUM SULFATE 2 GM/50ML IV SOLN
2.0000 g | Freq: Once | INTRAVENOUS | Status: AC
  Administered 2024-03-13: 2 g via INTRAVENOUS
  Filled 2024-03-13: qty 50

## 2024-03-13 MED ORDER — SODIUM CHLORIDE 0.9 % IV SOLN: INTRAVENOUS | Status: DC | PRN

## 2024-03-13 MED ORDER — POTASSIUM CHLORIDE CRYS ER 20 MEQ PO TBCR
20.0000 meq | EXTENDED_RELEASE_TABLET | Freq: Once | ORAL | Status: AC
  Administered 2024-03-13: 20 meq via ORAL
  Filled 2024-03-13: qty 1

## 2024-03-13 NOTE — Progress Notes (Signed)
 Urology Inpatient Progress Report  STEMI involving left anterior descending coronary artery (HCC) [I21.02]  Procedure(s): INSERTION, CARDIAC ASSIST DEVICE, IMPELLA ECHOCARDIOGRAM, TRANSESOPHAGEAL, INTRAOPERATIVE CYSTOSCOPY CLOT EVACUATION AND FULGURATION  5 Days Post-Op   Intv/Subj: No acute events overnight. Patient is without complaint.  Urine got a little bit bloody with ambulation but clears up some with rest.  Light red output on moderate drip CBI.  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) Active Problems:   Cardiogenic shock (HCC)  Current Facility-Administered Medications  Medication Dose Route Frequency Provider Last Rate Last Admin   0.9 %  sodium chloride  infusion   Intravenous PRN McLean, Dalton S, MD 10 mL/hr at 03/13/24 0800 Infusion Verify at 03/13/24 0800   acetaminophen  (TYLENOL ) tablet 650 mg  650 mg Oral Q4H PRN Cooper, Michael, MD   650 mg at 03/07/24 1208   amiodarone  (PACERONE ) tablet 200 mg  200 mg Oral Daily Bensimhon, Daniel R, MD   200 mg at 03/12/24 1034   aspirin  EC tablet 81 mg  81 mg Oral Daily McLean, Dalton S, MD   81 mg at 03/12/24 1034   Chlorhexidine  Gluconate Cloth 2 % PADS 6 each  6 each Topical Daily Rolan Ezra RAMAN, MD   6 each at 03/12/24 1100   digoxin (LANOXIN) tablet 0.0625 mg  0.0625 mg Oral Daily Bensimhon, Daniel R, MD   0.0625 mg at 03/12/24 1035   DOBUTamine  (DOBUTREX ) infusion 4000 mcg/mL  2.5 mcg/kg/min (Order-Specific) Intravenous Continuous Bensimhon, Toribio SAUNDERS, MD 2.98 mL/hr at 03/13/24 0800 2.5 mcg/kg/min at 03/13/24 0800   feeding supplement (ENSURE PLUS HIGH PROTEIN) liquid 237 mL  237 mL Oral TID BM McLean, Dalton S, MD   237 mL at 03/12/24 1422   finasteride  (PROSCAR ) tablet 5 mg  5 mg Oral Daily Colletta Manuelita Garre, PA-C   5 mg at 03/12/24 1034   HYDROmorphone  (DILAUDID ) injection 0.5 mg  0.5 mg Intravenous Q4H PRN Gretta Doffing P, DO   0.5 mg at 03/12/24 0944   melatonin tablet 3 mg   3 mg Oral QHS PRN Debarah Donley SAILOR, MD   3 mg at 03/04/24 2035   norepinephrine  (LEVOPHED ) 4mg  in (0.016 mg/mL) premix infusion  0-40 mcg/min Intravenous Titrated Bensimhon, Toribio SAUNDERS, MD   Stopped at 03/10/24 1056   ondansetron  (ZOFRAN ) injection 4 mg  4 mg Intravenous Q6H PRN Colletta Manuelita Garre, PA-C   4 mg at 03/12/24 1929   Oral care mouth rinse  15 mL Mouth Rinse PRN Rolan Ezra RAMAN, MD       oxyCODONE  (Oxy IR/ROXICODONE ) immediate release tablet 5-10 mg  5-10 mg Oral Q4H PRN Cooper, Michael, MD   5 mg at 03/10/24 1227   pantoprazole  (PROTONIX ) EC tablet 40 mg  40 mg Oral Daily Colletta Manuelita Garre, PA-C   40 mg at 03/12/24 1034   polyethylene glycol (MIRALAX  / GLYCOLAX ) packet 17 g  17 g Oral Daily Bensimhon, Toribio SAUNDERS, MD   17 g at 03/11/24 0908   promethazine  (PHENERGAN ) 12.5 mg in sodium chloride  0.9 % 50 mL IVPB  12.5 mg Intravenous Q6H PRN McLean, Dalton S, MD   Paused at 03/03/24 2233   QUEtiapine  (SEROQUEL ) tablet 25 mg  25 mg Oral QHS Claudene Toribio BROCKS, MD   25 mg at 03/12/24 2111   rosuvastatin  (CRESTOR ) tablet 20 mg  20 mg Oral Daily Cooper, Michael, MD   20 mg at 03/12/24 1034   senna-docusate (Senokot-S) tablet 2 tablet  2 tablet Oral QHS Bensimhon, Daniel R, MD   2 tablet at 03/12/24 2110   simethicone  (MYLICON) chewable tablet 80 mg  80 mg Oral QID PRN Claudene Toribio BROCKS, MD   80 mg at 03/07/24 1610   sodium bicarbonate  25 mEq (Impella PURGE) in dextrose  5 % 1000 mL bag   Intracatheter Continuous Cooper, Michael, MD   New Bag at 03/12/24 1449   sodium chloride  flush (NS) 0.9 % injection 10-40 mL  10-40 mL Intracatheter PRN Rolan Ezra RAMAN, MD       sodium chloride  irrigation 0.9 % 3,000 mL  3,000 mL Irrigation Continuous Smith, Daniel C, MD   3,000 mL at 03/13/24 0502   spironolactone (ALDACTONE) tablet 12.5 mg  12.5 mg Oral Daily Bensimhon, Toribio SAUNDERS, MD   12.5 mg at 03/12/24 1034   tamsulosin  (FLOMAX ) capsule 0.4 mg  0.4 mg Oral Daily Colletta Manuelita Garre, PA-C   0.4 mg at  03/12/24 1035   ticagrelor  (BRILINTA ) tablet 90 mg  90 mg Oral BID McLean, Dalton S, MD   90 mg at 03/12/24 2111   traZODone  (DESYREL ) tablet 50 mg  50 mg Oral QHS Claudene Toribio BROCKS, MD   50 mg at 03/12/24 2111   vasopressin  (PITRESSIN) 20 Units in 100 mL (0.2 unit/mL) infusion-*FOR SHOCK*  0-0.03 Units/min Intravenous Continuous Rolan Ezra RAMAN, MD   Stopped at 03/10/24 1758     Objective: Vital: Vitals:   03/13/24 0700 03/13/24 0736 03/13/24 0750 03/13/24 0800  BP: 116/72   91/66  Pulse: (!) 111 (!) 101  (!) 103  Resp: 16 18  15   Temp:   98.6 F (37 C)   TempSrc:   Oral   SpO2: 92% 96%  95%  Weight:      Height:       I/Os: I/O last 3 completed shifts: In: 27719.5 [P.O.:920; I.V.:305.3; Other:26494.2] Out: 66484 [Urine:33515]  Physical Exam:  General: Patient is in no apparent distress Lungs: Normal respiratory effort, chest expands symmetrically. GI:The abdomen is soft and nontender without mass. Foley: Three-way Foley catheter in place draining light red on slow to medium drip CBI Ext: lower extremities symmetric  Lab Results: Recent Labs    03/12/24 0505 03/12/24 1756 03/13/24 0400  WBC 8.5 10.4 10.6*  HGB 8.7* 9.5* 8.8*  HCT 27.3* 29.0* 27.0*   Recent Labs    03/11/24 1805 03/12/24 0505 03/13/24 0400  NA 136 139 137  K 3.7 4.1 3.9  CL 104 106 100  CO2 24 25 27   GLUCOSE 131* 99 104*  BUN 36* 32* 32*  CREATININE 1.58* 1.51* 1.72*  CALCIUM  7.6* 7.8* 7.7*   No results for input(s): LABPT, INR in the last 72 hours. No results for input(s): LABURIN in the last 72 hours. Results for orders placed or performed during the hospital encounter of 03/03/24  MRSA Next Gen by PCR, Nasal     Status: None   Collection Time: 03/03/24  9:37 PM   Specimen: Nasal Mucosa; Nasal Swab  Result Value Ref Range Status   MRSA by PCR Next Gen NOT DETECTED NOT DETECTED Final    Comment: (NOTE) The GeneXpert MRSA Assay (FDA approved for NASAL specimens only), is one  component of a comprehensive MRSA colonization surveillance program. It is not intended to diagnose MRSA infection nor to guide or monitor treatment for MRSA infections. Test performance is not FDA approved in patients less than 57 years old. Performed at [facility], [address],  [place], [place] [address]     Studies/Results: ECHOCARDIOGRAM LIMITED Result Date: 03/12/2024    ECHOCARDIOGRAM LIMITED REPORT   Patient Name:   Timothy Nicholson Date of Exam: 03/12/2024 Medical Rec #:  [id number]   Height:       70.0 in Accession #:    [id number]  Weight:       187.2 lb Date of Birth:  12/27/1950  BSA:          2.029 m Patient Age:    73 years    BP:           122/72 mmHg Patient Gender: M           HR:           86 bpm. Exam Location:  Inpatient Procedure: 2D Echo, Color Doppler and Limited Echo (Both Spectral and Color Flow            Doppler were utilized during procedure). Indications:    CHF- Acute Systolic I50.21  History:        Patient has prior history of Echocardiogram examinations, most                 recent 03/04/2024. CHF and Impella, STEMI, Shock; Risk                 Factors:Hypertension and Dyslipidemia.  Sonographer:    Koleen Popper RDCS Referring Phys: [id number] DANIEL R BENSIMHON IMPRESSIONS  1. Limited study for Impella positioning. Normal right ventricular function. Moderately reduced left ventricular systolic function, EF 30-35% with apical dyskinesis and anterior/anteroseptal hypokinesis. There is no pericardial effusion and there is no aortic insufficiency. Comparison(s): Prior images reviewed side by side. The left ventricular function has improved. FINDINGS  Left Ventricle: Additional Comments: Color Doppler performed.   LV Volumes (MOD) LV vol d, MOD A4C: 171.0 ml LV vol s, MOD A4C: 99.6 ml LV SV MOD A4C:     171.0 ml IVC IVC diam: 1.20 cm Jerel Croitoru MD Electronically signed by Jerel Balding MD Signature Date/Time: 03/12/2024/12:59:00 PM    Final    US  EKG SITE  RITE Result Date: 03/11/2024 If Site Rite image not attached, placement could not be confirmed due to current cardiac rhythm.   Assessment: Gross hematuria secondary to BPH Right retroperitoneal bleed--stable Right angiomyolipoma  Procedure(s): INSERTION, CARDIAC ASSIST DEVICE, IMPELLA ECHOCARDIOGRAM, TRANSESOPHAGEAL, INTRAOPERATIVE CYSTOSCOPY CLOT EVACUATION AND FULGURATION, 5 Days Post-Op  doing well.  Plan: Continue Flomax  finasteride .  Continue to wean CBI.  If unable to wean, Baptista repeat a CTA and consider prostate embolization.   Sherwood Edison, MD Urology 03/13/2024, 8:22 AM

## 2024-03-13 NOTE — Progress Notes (Addendum)
 Patient ID: Timothy Nicholson, male   DOB: 1950-04-22, 73 y.o.   MRN: [id number]     Advanced Heart Failure Rounding Note  Cardiologist: None  Chief Complaint: AMI CS   Patient Profile:    73 y/o male admitted w/ acute anterior STEMI c/b CS requiring Impella CP support, further c/b + RPB/hemorrhagic shock. Given ongoing support needs and inability to wean off chemical inotrope/pressor support, was upgraded to Impella 5.5 on 11/25.   Subjective:    POD #5, s/p Impella 5.5  Impella 5.5 at P-5 with 3.3 flow   Now off NE and VP. On DBA 2.. Co-ox 75 .   Continues with hematuria. Now with 3-way Foley in place  Diuresed well yesterday. Weight down 10 pounds  CVP 7  Pocus echo 11/29 on P-7: EF 35-40% normal RV.  Objective:   Weight Range: 79.6 kg Body mass index is 25.18 kg/m.   Vital Signs:   Temp:  [98.4 F (36.9 C)-98.9 F (37.2 C)] 98.6 F (37 C) (11/30 0750) Pulse Rate:  [84-111] 85 (11/30 1000) Resp:  [11-39] 39 (11/30 1000) BP: (91-121)/(57-73) 95/68 (11/30 1000) SpO2:  [88 %-98 %] 95 % (11/30 1000) Weight:  [79.6 kg] 79.6 kg (11/30 0500) Last BM Date : 03/12/24  Weight change: Filed Weights   03/11/24 0437 03/12/24 0500 03/13/24 0500  Weight: 85.9 kg 84.9 kg 79.6 kg    Intake/Output:   Intake/Output Summary (Last 24 hours) at 03/13/2024 1022 Last data filed at 03/13/2024 1000 Gross per 24 hour  Intake 27672.04 ml  Output 67049 ml  Net -5277.96 ml    Physical Exam   General:  Sitting up in chair. No resp difficulty HEENT: normal Neck: supple. no JVD.  Cor: Regular rate & rhythm. No rubs, gallops or murmurs. Impella site ok Lungs: clear Abdomen: soft, nontender, nondistended.Good bowel sounds. Extremities: no cyanosis, clubbing, rash, edema  + Foley Neuro: alert & orientedx3, cranial nerves grossly intact. moves all 4 extremities w/o difficulty. Affect pleasant   Telemetry   NSR 80-90s Personally reviewed  Labs    CBC Recent Labs    03/12/24 1756  03/13/24 0400  WBC 10.4 10.6*  HGB 9.5* 8.8*  HCT 29.0* 27.0*  MCV 93.2 93.4  PLT 234 243   Basic Metabolic Panel Recent Labs    88/70/74 0505 03/13/24 0400  NA 139 137  K 4.1 3.9  CL 106 100  CO2 25 27  GLUCOSE 99 104*  BUN 32* 32*  CREATININE 1.51* 1.72*  CALCIUM  7.8* 7.7*  MG 2.0 1.8   Liver Function Tests No results for input(s): AST, ALT, ALKPHOS, BILITOT, PROT, ALBUMIN  in the last 72 hours.  No results for input(s): LIPASE, AMYLASE in the last 72 hours. Cardiac Enzymes No results for input(s): CKTOTAL, CKMB, CKMBINDEX, TROPONINI in the last 72 hours.  BNP: BNP (last 3 results) No results for input(s): BNP in the last 8760 hours.  ProBNP (last 3 results) No results for input(s): PROBNP in the last 8760 hours.   D-Dimer No results for input(s): DDIMER in the last 72 hours. Hemoglobin A1C No results for input(s): HGBA1C in the last 72 hours.  Fasting Lipid Panel No results for input(s): CHOL, HDL, LDLCALC, TRIG, CHOLHDL, LDLDIRECT in the last 72 hours.  Thyroid Function Tests No results for input(s): TSH, T4TOTAL, T3FREE, THYROIDAB in the last 72 hours.  Invalid input(s): FREET3  Other results:   Imaging    ECHOCARDIOGRAM LIMITED Result Date: 03/12/2024    ECHOCARDIOGRAM LIMITED  REPORT   Patient Name:   Timothy Nicholson Date of Exam: 03/12/2024 Medical Rec #:  [id number]   Height:       70.0 in Accession #:    [id number]  Weight:       187.2 lb Date of Birth:  25-Jul-1950  BSA:          2.029 m Patient Age:    73 years    BP:           122/72 mmHg Patient Gender: M           HR:           86 bpm. Exam Location:  Inpatient Procedure: 2D Echo, Color Doppler and Limited Echo (Both Spectral and Color Flow            Doppler were utilized during procedure). Indications:    CHF- Acute Systolic I50.21  History:        Patient has prior history of Echocardiogram examinations, most                 recent 03/04/2024.  CHF and Impella, STEMI, Shock; Risk                 Factors:Hypertension and Dyslipidemia.  Sonographer:    Koleen Popper RDCS Referring Phys: [id number] Mossie Gilder R Simeon Vera IMPRESSIONS  1. Limited study for Impella positioning. Normal right ventricular function. Moderately reduced left ventricular systolic function, EF 30-35% with apical dyskinesis and anterior/anteroseptal hypokinesis. There is no pericardial effusion and there is no aortic insufficiency. Comparison(s): Prior images reviewed side by side. The left ventricular function has improved. FINDINGS  Left Ventricle: Additional Comments: Color Doppler performed.   LV Volumes (MOD) LV vol d, MOD A4C: 171.0 ml LV vol s, MOD A4C: 99.6 ml LV SV MOD A4C:     171.0 ml IVC IVC diam: 1.20 cm Mihai Croitoru MD Electronically signed by Jerel Balding MD Signature Date/Time: 03/12/2024/12:59:00 PM    Final          Medications:     Scheduled Medications:  amiodarone   200 mg Oral Daily   aspirin  EC  81 mg Oral Daily   Chlorhexidine  Gluconate Cloth  6 each Topical Daily   digoxin   0.0625 mg Oral Daily   feeding supplement  237 mL Oral TID BM   finasteride   5 mg Oral Daily   pantoprazole   40 mg Oral Daily   polyethylene glycol  17 g Oral Daily   QUEtiapine   25 mg Oral QHS   rosuvastatin   20 mg Oral Daily   senna-docusate  2 tablet Oral QHS   spironolactone   12.5 mg Oral Daily   tamsulosin   0.4 mg Oral Daily   ticagrelor   90 mg Oral BID   traZODone   50 mg Oral QHS    Infusions:  sodium chloride  10 mL/hr at 03/13/24 1000   DOBUTamine  2.5 mcg/kg/min (03/13/24 1000)   magnesium  sulfate bolus IVPB     norepinephrine  (LEVOPHED ) Adult infusion Stopped (03/10/24 1056)   promethazine  (PHENERGAN ) injection (IM or IVPB) Stopped (03/03/24 2233)   sodium bicarbonate  25 mEq (Impella PURGE) in dextrose  5 % 1000 mL bag     sodium chloride  irrigation     vasopressin  Stopped (03/10/24 1758)    PRN Medications: sodium chloride , acetaminophen ,  HYDROmorphone  (DILAUDID ) injection, melatonin, ondansetron  (ZOFRAN ) IV, mouth rinse, oxyCODONE , promethazine  (PHENERGAN ) injection (IM or IVPB), simethicone , sodium chloride  flush   Assessment/Plan   1. CAD: Anterior STEMI with occluded proximal LAD; the  RCA was not selectively engaged but appeared to be relatively small with proximal occlusion. Now s/p DES to proximal LAD and PTCA to ostial D1.  - no angina - continue ASA/statin/Brilinta  - management of post MI shock as below 2. Cardiogenic shock: Due to anterior MI.  Limited echo with Impella CP showed EF 25% with LAD territory akinesis, normal RV size/mildly decreased systolic function. Now w/ Impella 5.5 to give extended time for recovery vs bridge to durable LVAD.  - Now off NE and VP on DBA 2.5 Co-ox 75% - Impella 5.5 on P-5 with  3.3L flow. Good waveforms - Pocus echo 11/29 EF 35-40% normal RV on Impella P-7 - I turned Impella to P-4 and LVED went form 3-> 5. Good unloading - POCUS echo today 11/30 EF 40-45% - Will keep impella to P-4 (would not go lower without AC) - Plan 5.5 removal tomorrow (discussed with TCTS) - Continue spiro and digoxin  - LDH ok 3. AKI on CKD stage 3: Creatinine 1.6 => 2.1 -> 2.3 -> ->1.7 -> 1.59> 1.66 > 1.51 > 1.7 - Due to ATN. Improving 4. VT: Peri-STEMI. - VT quiescent  - Continue po amio 5. Heme: Hemorrhagic component to shock with hematoma at Impella access site and RP hematoma + hematuria.  CTA showed RP hematoma without active extravasation.  - No heparin  as above (HCO3 purge).  - hgb stable 8.8 today  Transfuse Hgb < 8.  - follow CBC  6. Pulmonary: Possible aspiration PNA.  - completed 5 day course of Unasyn .  7. Hematuria - Urology managing s/p fulguration of bladder.  - Now on 3-way - continue to hold heparin  8. Thrombocytopenia - resolved 9. Illeus: - resolved 10. Retroperitoneal hematoma - stable  CRITICAL CARE Performed by: [name]  Total critical care time: 40  minutes  Critical care time was exclusive of separately billable procedures and treating other patients.  Critical care was necessary to treat or prevent imminent or life-threatening deterioration.  Critical care was time spent personally by me on the following activities: development of treatment plan with patient and/or surrogate as well as nursing, discussions with consultants, evaluation of patient's response to treatment, examination of patient, obtaining history from patient or surrogate, ordering and performing treatments and interventions, ordering and review of laboratory studies, ordering and review of radiographic studies, pulse oximetry and re-evaluation of patient's condition.    Length of Stay: 10  [name], MD  03/13/2024, 10:22 AM  Advanced Heart Failure Team Pager [phone] (M-F; 7a - 5p)  Please contact [facility] for night-coverage after hours (5p -7a ) and weekends on [email or web address]

## 2024-03-14 ENCOUNTER — Encounter (HOSPITAL_COMMUNITY): Admission: EM | Disposition: A | Payer: Self-pay | Source: Home / Self Care | Attending: Cardiology

## 2024-03-14 ENCOUNTER — Inpatient Hospital Stay (HOSPITAL_COMMUNITY): Admitting: Anesthesiology

## 2024-03-14 DIAGNOSIS — I110 Hypertensive heart disease with heart failure: Secondary | ICD-10-CM

## 2024-03-14 DIAGNOSIS — I509 Heart failure, unspecified: Secondary | ICD-10-CM | POA: Diagnosis not present

## 2024-03-14 DIAGNOSIS — Z4509 Encounter for adjustment and management of other cardiac device: Secondary | ICD-10-CM | POA: Diagnosis not present

## 2024-03-14 DIAGNOSIS — R570 Cardiogenic shock: Secondary | ICD-10-CM | POA: Diagnosis not present

## 2024-03-14 DIAGNOSIS — I2102 ST elevation (STEMI) myocardial infarction involving left anterior descending coronary artery: Secondary | ICD-10-CM | POA: Diagnosis not present

## 2024-03-14 HISTORY — PX: REMOVAL OF IMPELLA LEFT VENTRICULAR ASSIST DEVICE: SHX6556

## 2024-03-14 LAB — COOXEMETRY PANEL
Carboxyhemoglobin: 4 % — ABNORMAL HIGH (ref 0.5–1.5)
Methemoglobin: <0.7 % (ref 0.0–1.5)
O2 Saturation: 76.3 %
Total hemoglobin: 8.3 g/dL — ABNORMAL LOW (ref 12.0–16.0)

## 2024-03-14 LAB — CBC
HCT: 26.1 % — ABNORMAL LOW (ref 39.0–52.0)
Hemoglobin: 8.5 g/dL — ABNORMAL LOW (ref 13.0–17.0)
MCH: 30.7 pg (ref 26.0–34.0)
MCHC: 32.6 g/dL (ref 30.0–36.0)
MCV: 94.2 fL (ref 80.0–100.0)
Platelets: 275 K/uL (ref 150–400)
RBC: 2.77 MIL/uL — ABNORMAL LOW (ref 4.22–5.81)
RDW: 14.8 % (ref 11.5–15.5)
WBC: 9.6 K/uL (ref 4.0–10.5)
nRBC: 0 % (ref 0.0–0.2)

## 2024-03-14 LAB — MAGNESIUM: Magnesium: 2.1 mg/dL (ref 1.7–2.4)

## 2024-03-14 LAB — BASIC METABOLIC PANEL WITH GFR
Anion gap: 7 (ref 5–15)
BUN: 32 mg/dL — ABNORMAL HIGH (ref 8–23)
CO2: 27 mmol/L (ref 22–32)
Calcium: 7.7 mg/dL — ABNORMAL LOW (ref 8.9–10.3)
Chloride: 102 mmol/L (ref 98–111)
Creatinine, Ser: 1.43 mg/dL — ABNORMAL HIGH (ref 0.61–1.24)
GFR, Estimated: 52 mL/min — ABNORMAL LOW (ref >60)
Glucose, Bld: 102 mg/dL — ABNORMAL HIGH (ref 70–99)
Potassium: 4.2 mmol/L (ref 3.5–5.1)
Sodium: 136 mmol/L (ref 135–145)

## 2024-03-14 LAB — LACTATE DEHYDROGENASE: LDH: 391 U/L — ABNORMAL HIGH (ref 105–235)

## 2024-03-14 SURGERY — REMOVAL, CARDIAC ASSIST DEVICE, IMPELLA
Anesthesia: General

## 2024-03-14 MED ORDER — VASOPRESSIN 20 UNIT/ML IV SOLN
INTRAVENOUS | Status: AC
  Filled 2024-03-14: qty 1

## 2024-03-14 MED ORDER — FENTANYL CITRATE (PF) 250 MCG/5ML IJ SOLN
INTRAMUSCULAR | Status: DC | PRN
  Administered 2024-03-14: 50 ug via INTRAVENOUS

## 2024-03-14 MED ORDER — ONDANSETRON HCL 4 MG/2ML IJ SOLN
INTRAMUSCULAR | Status: DC | PRN
  Administered 2024-03-14: 4 mg via INTRAVENOUS

## 2024-03-14 MED ORDER — LACTATED RINGERS IV SOLN: INTRAVENOUS | Status: DC | PRN

## 2024-03-14 MED ORDER — FENTANYL CITRATE (PF) 250 MCG/5ML IJ SOLN
INTRAMUSCULAR | Status: AC
  Filled 2024-03-14: qty 5

## 2024-03-14 MED ORDER — DEXAMETHASONE SOD PHOSPHATE PF 10 MG/ML IJ SOLN
INTRAMUSCULAR | Status: DC | PRN
  Administered 2024-03-14: 10 mg via INTRAVENOUS

## 2024-03-14 MED ORDER — PHENYLEPHRINE HCL-NACL 20-0.9 MG/250ML-% IV SOLN
0.0000 ug/min | INTRAVENOUS | Status: DC
  Administered 2024-03-14 – 2024-03-15 (×2): 60 ug/min via INTRAVENOUS
  Filled 2024-03-14 (×2): qty 250

## 2024-03-14 MED ORDER — LIDOCAINE 2% (20 MG/ML) 5 ML SYRINGE
INTRAMUSCULAR | Status: DC | PRN
  Administered 2024-03-14: 60 mg via INTRAVENOUS

## 2024-03-14 MED ORDER — CEFAZOLIN SODIUM-DEXTROSE 2-3 GM-%(50ML) IV SOLR
INTRAVENOUS | Status: DC | PRN
  Administered 2024-03-14: 2 g via INTRAVENOUS

## 2024-03-14 MED ORDER — PROPOFOL 10 MG/ML IV BOLUS
INTRAVENOUS | Status: DC | PRN
  Administered 2024-03-14: 100 mg via INTRAVENOUS

## 2024-03-14 MED ORDER — VASOPRESSIN 20 UNIT/ML IV SOLN
INTRAVENOUS | Status: DC | PRN
  Administered 2024-03-14: 1 [IU] via INTRAVENOUS

## 2024-03-14 MED ORDER — PHENYLEPHRINE HCL-NACL 20-0.9 MG/250ML-% IV SOLN
INTRAVENOUS | Status: DC | PRN
  Administered 2024-03-14: 50 ug/min via INTRAVENOUS

## 2024-03-14 MED ORDER — 0.9 % SODIUM CHLORIDE (POUR BTL) OPTIME
TOPICAL | Status: DC | PRN
  Administered 2024-03-14: 1000 mL

## 2024-03-14 SURGICAL SUPPLY — 33 items
BAG DECANTER FOR FLEXI CONT (MISCELLANEOUS) ×1 IMPLANT
BLADE CLIPPER SURG (BLADE) ×1 IMPLANT
BLANKET WARM LOWER BOD BAIR (MISCELLANEOUS) IMPLANT
CANISTER SUCTION 3000ML PPV (SUCTIONS) ×1 IMPLANT
CATH ROBINSON RED A/P 14FR (CATHETERS) IMPLANT
CLIP TI LARGE 6 (CLIP) IMPLANT
CLIP TI MEDIUM 6 (CLIP) IMPLANT
CLIP TI WIDE RED SMALL 6 (CLIP) IMPLANT
DRAPE CHEST BREAST 15X10 FENES (DRAPES) IMPLANT
DRAPE CV SPLIT W-CLR ANES SCRN (DRAPES) IMPLANT
DRAPE PERI GROIN 82X75IN TIB (DRAPES) IMPLANT
FELT TEFLON 1X6 (MISCELLANEOUS) IMPLANT
GAUZE SPONGE 4X4 12PLY STRL (GAUZE/BANDAGES/DRESSINGS) ×2 IMPLANT
GOWN STRL REUS W/ TWL LRG LVL3 (GOWN DISPOSABLE) ×4 IMPLANT
KIT BASIN OR (CUSTOM PROCEDURE TRAY) ×1 IMPLANT
KIT TURNOVER KIT B (KITS) ×1 IMPLANT
PACK CHEST (CUSTOM PROCEDURE TRAY) IMPLANT
PACKING GAUZE IODOFORM 1INX5YD (GAUZE/BANDAGES/DRESSINGS) IMPLANT
PAD ELECT DEFIB RADIOL ZOLL (MISCELLANEOUS) ×1 IMPLANT
PENCIL BUTTON HOLSTER BLD 10FT (ELECTRODE) IMPLANT
POSITIONER HEAD DONUT 9IN (MISCELLANEOUS) ×1 IMPLANT
SOLN 0.9% NACL POUR BTL 1000ML (IV SOLUTION) ×2 IMPLANT
SOLN STERILE WATER BTL 1000 ML (IV SOLUTION) ×1 IMPLANT
STRIP CLOSURE SKIN 1/2X4 (GAUZE/BANDAGES/DRESSINGS) IMPLANT
SUT PROLENE 3 0 SH DA (SUTURE) IMPLANT
SUT SILK 3 0 SH CR/8 (SUTURE) IMPLANT
SUT VIC AB 1 CTX36XBRD ANBCTR (SUTURE) ×2 IMPLANT
SUT VIC AB 2-0 CT1 TAPERPNT 27 (SUTURE) ×2 IMPLANT
SUT VIC AB 3-0 X1 27 (SUTURE) ×2 IMPLANT
TAPE CLOTH SURG 4X10 WHT LF (GAUZE/BANDAGES/DRESSINGS) IMPLANT
TOWEL GREEN STERILE (TOWEL DISPOSABLE) ×1 IMPLANT
TOWEL GREEN STERILE FF (TOWEL DISPOSABLE) ×1 IMPLANT
TUBE CONNECTING 20X1/4 (TUBING) IMPLANT

## 2024-03-14 NOTE — Progress Notes (Signed)
 Brief Progress Note  Doing well, hemodynamically stable.  Vitals:   03/14/24 1200 03/14/24 1300  BP: 96/62 99/62  Pulse: 86 89  Resp: 13 16  Temp:    SpO2: 95% 94%   Plan: OR Today for impella removal  Con Clunes, MD Cardiothoracic Surgery Pager: [phone]

## 2024-03-14 NOTE — Anesthesia Procedure Notes (Signed)
 Procedure Name: LMA Insertion Date/Time: 03/14/2024 2:46 PM  Performed by: Arvell Edsel HERO, CRNAPre-anesthesia Checklist: Patient identified, Emergency Drugs available, Suction available, Patient being monitored and Timeout performed Patient Re-evaluated:Patient Re-evaluated prior to induction Oxygen Delivery Method: Circle system utilized Preoxygenation: Pre-oxygenation with 100% oxygen Induction Type: IV induction Ventilation: Oral airway inserted - appropriate to patient size LMA: LMA inserted LMA Size: 5.0 Number of attempts: 1 Placement Confirmation: positive ETCO2 and breath sounds checked- equal and bilateral Tube secured with: Tape Dental Injury: Teeth and Oropharynx as per pre-operative assessment

## 2024-03-14 NOTE — Anesthesia Preprocedure Evaluation (Signed)
 Anesthesia Evaluation  Patient identified by MRN, date of birth, ID band Patient awake    Reviewed: Allergy & Precautions, NPO status , Patient's Chart, lab work & pertinent test results  Airway        Dental   Pulmonary pneumonia Treated for aspiration PNA          Cardiovascular hypertension, + Past MI, + Cardiac Stents and +CHF    Impella in place.  Dobutamine  gtt  Levo and Vaso off   Neuro/Psych  Headaches    GI/Hepatic ,GERD  ,,  Endo/Other    Renal/GU ARFRenal diseaseCr 1.43     Musculoskeletal  (+) Arthritis ,    Abdominal   Peds  Hematology  (+) Blood dyscrasia, anemia   Anesthesia Other Findings   Reproductive/Obstetrics                              Anesthesia Physical Anesthesia Plan  ASA: 4  Anesthesia Plan: General   Post-op Pain Management:    Induction: Intravenous  PONV Risk Score and Plan: 2 and Ondansetron , Dexamethasone  and Treatment Ariel vary due to age or medical condition  Airway Management Planned: Oral ETT  Additional Equipment: Arterial line and TEE  Intra-op Plan:   Post-operative Plan: Extubation in OR  Informed Consent: I have reviewed the patients History and Physical, chart, labs and discussed the procedure including the risks, benefits and alternatives for the proposed anesthesia with the patient or authorized representative who has indicated his/her understanding and acceptance.     Dental advisory given  Plan Discussed with: CRNA, Anesthesiologist and Surgeon  Anesthesia Plan Comments:         Anesthesia Quick Evaluation

## 2024-03-14 NOTE — Progress Notes (Signed)
 PT Cancellation Note  Patient Details Name: Timothy Nicholson MRN: [id number] DOB: 02-15-1951   Cancelled Treatment:    Reason Eval/Treat Not Completed: Patient at procedure or test/unavailable  Off unit for removal of Impella. Will attempt to work with pt tomorrow.  Leontine Roads, PT, DPT [facility]  Rehabilitation Services Physical Therapist Office: [phone] Website: [email or web address]   Leontine GORMAN Roads 03/14/2024, 2:37 PM

## 2024-03-14 NOTE — Progress Notes (Signed)
 Advanced Heart Failure Rounding Note Cardiologist: None  Chief Complaint: AMI CS  Patient Profile:    73 y/o male admitted w/ acute anterior STEMI c/b CS requiring Impella CP support, further c/b + RPB/hemorrhagic shock. Given ongoing support needs and inability to wean off chemical inotrope/pressor support, was upgraded to Impella 5.5 on 11/25.   Subjective:    POD #6, s/p Impella 5.5  Impella 5.5 at P4 with ~3LPM flow. Pocus echo 11/29 on P-7: EF 35-40%, pocus on 11/30 with EF 40-45%. Plan to removed 5.5 today per Dr. Daniel.  On DBA 2.5. Co-ox 76%.  Net negative 6.1L. Weight down 1L.  Continues with hematuria. On CBI. Uorlogy following.   Sitting up in chair. Feeling well, no SOB or CP. Has been ambulating and having bowel movements.   Objective:    Weight Range: 79.2 kg Body mass index is 25.05 kg/m.   Vital Signs:   Temp:  [98.4 F (36.9 C)-99.7 F (37.6 C)] 98.4 F (36.9 C) (12/01 0606) Pulse Rate:  [84-107] 97 (12/01 0700) Resp:  [11-42] 21 (12/01 0700) BP: (89-114)/(53-80) 105/61 (12/01 0700) SpO2:  [90 %-99 %] 93 % (12/01 0700) Weight:  [79.2 kg] 79.2 kg (12/01 0500) Last BM Date : 03/13/24  Weight change: Filed Weights   03/12/24 0500 03/13/24 0500 03/14/24 0500  Weight: 84.9 kg 79.6 kg 79.2 kg    Intake/Output:  Intake/Output Summary (Last 24 hours) at 03/14/2024 0747 Last data filed at 03/14/2024 0700 Gross per 24 hour  Intake 19244.24 ml  Output 74624 ml  Net -6130.76 ml    Physical Exam   General: Elderly appearing. No distress  Cardiac: No murmurs. Impella hum. Resp: Lung sounds clear and equal B/L Extremities: Warm and dry.  No peripheral edema.  Neuro: A&O x3. Affect pleasant.   Telemetry   SR 90s, 6b NSVT x1 (personally reviewed)  Labs    CBC Recent Labs    03/13/24 0400 03/14/24 0411  WBC 10.6* 9.6  HGB 8.8* 8.5*  HCT 27.0* 26.1*  MCV 93.4 94.2  PLT 243 275   Basic Metabolic Panel Recent Labs    88/69/74 0400  03/14/24 0515  NA 137 136  K 3.9 4.2  CL 100 102  CO2 27 27  GLUCOSE 104* 102*  BUN 32* 32*  CREATININE 1.72* 1.43*  CALCIUM  7.7* 7.7*  MG 1.8 2.1   Medications:    Scheduled Medications:  amiodarone   200 mg Oral Daily   aspirin  EC  81 mg Oral Daily   Chlorhexidine  Gluconate Cloth  6 each Topical Daily   digoxin  0.0625 mg Oral Daily   feeding supplement  237 mL Oral TID BM   finasteride   5 mg Oral Daily   pantoprazole   40 mg Oral Daily   polyethylene glycol  17 g Oral Daily   QUEtiapine   25 mg Oral QHS   rosuvastatin   20 mg Oral Daily   senna-docusate  2 tablet Oral QHS   spironolactone  12.5 mg Oral Daily   tamsulosin   0.4 mg Oral Daily   ticagrelor   90 mg Oral BID   traZODone   50 mg Oral QHS    Infusions:  sodium chloride  10 mL/hr at 03/14/24 0700   DOBUTamine  2.5 mcg/kg/min (03/14/24 0700)   norepinephrine  (LEVOPHED ) Adult infusion Stopped (03/10/24 1056)   promethazine  (PHENERGAN ) injection (IM or IVPB) Stopped (03/03/24 2233)   sodium bicarbonate  25 mEq (Impella PURGE) in dextrose  5 % 1000 mL bag     sodium  chloride irrigation     vasopressin  Stopped (03/10/24 1758)    PRN Medications: sodium chloride , acetaminophen , HYDROmorphone  (DILAUDID ) injection, melatonin, ondansetron  (ZOFRAN ) IV, mouth rinse, oxyCODONE , promethazine  (PHENERGAN ) injection (IM or IVPB), simethicone , sodium chloride  flush   Assessment/Plan   1. CAD: Anterior STEMI with occluded proximal LAD; the RCA was not selectively engaged but appeared to be relatively small with proximal occlusion. Now s/p DES to proximal LAD and PTCA to ostial D1.  - no angina - continue ASA/statin/Brilinta  - management of post MI shock as below  2. Cardiogenic shock: Due to anterior MI.  Limited echo with Impella CP showed EF 25% with LAD territory akinesis, normal RV size/mildly decreased systolic function. Now w/ Impella 5.5 to give extended time for recovery vs bridge to durable LVAD.  - Now off NE and VP on  DBA 2.5 Co-ox 76% - Impella 5.5 on P4 with ~3L flow. Good waveforms - Pocus echo 11/29 EF 35-40% normal RV on Impella P-7 - Pocus echo  1/30 EF 40-45% - Will keep impella to P-4 (would not go lower without AC) - Plan 5.5 removal today  - Continue spiro 12.5 mg and digoxin 0.0625 mg daily - LDH ok 3. CKD stage 3: AKI revolved. Baseline creatinine 1.6. Peaked at 2.3. Now at baseline 1.4 - Due to ATN. Improving 4. VT: Peri-STEMI. - VT quiescent  - continue amio 200 mg daily 5. Heme: Hemorrhagic component to shock with hematoma at Impella access site and RP hematoma + hematuria.  CTA showed RP hematoma without active extravasation.  - No heparin  as above (HCO3 purge).  - hgb stable 8.5 today  Transfuse Hgb < 8.  - follow CBC  6. Pulmonary: Possible aspiration PNA.  - completed 5 day course of Unasyn .  7. Hematuria - Urology managing s/p fulguration of bladder.  - Now on 3-way - continue to hold heparin  8. Thrombocytopenia - resolved 9. Illeus: - resolved 10. Retroperitoneal hematoma - stable  CRITICAL CARE Performed by: Andromeda Poppen  Total critical care time: 11 minutes  -Critical care time was exclusive of separately billable procedures and treating other patients. -Critical care was necessary to treat or prevent imminent or life-threatening deterioration. -Critical care was time spent personally by me on the following activities: development of treatment plan with patient and/or surrogate as well as nursing, discussions with consultants, evaluation of patient's response to treatment, examination of patient, obtaining history from patient or surrogate, ordering and performing treatments and interventions, ordering and review of laboratory studies, ordering and review of radiographic studies, pulse oximetry and re-evaluation of patient's condition.    Length of Stay: 78  Quadir Muns, NP  03/14/2024, 7:47 AM  Advanced Heart Failure Team Pager [phone] (M-F; 7a - 5p)  Please  contact [facility] for night-coverage after hours (5p -7a ) and weekends on [email or web address]

## 2024-03-14 NOTE — Op Note (Signed)
 CARDIOVASCULAR SURGERY OPERATIVE NOTE  03/14/24   Surgeon:  Con Clunes, MD  First Assistant: N/A   Preoperative Diagnosis:  Cardiogenic shock secondary to STEMI s/p PCI.   Postoperative Diagnosis:  Same   Procedure:  Removal of right axillary artery Impella 5.5  Anesthesia:  General Endotracheal   Clinical History/Surgical Indication:  56M who presented with anterior STEMI with occluded proximal LAD s/p DES.  He has acute decompensation and underwent Imeplla CP placement with EF 25%.  He was transitioned to Impella 5.5 and has since had myocardial recovery.    Preparation:  The patient was taken directed from the ICU to the OR. The consent was signed by me. Preoperative antibiotics were given.  After being placed under general endotracheal anesthesia by the anesthesia team the neck and chest were prepped with betadine soap and solution and draped in the usual sterile manner. A surgical time-out was taken and the correct patient and operative procedure were confirmed with the nursing and anesthesia staff.  Procedure in detail:  The previous axillary incision was re-opened and irrigated.  The Impella was turned down to P2 and the hemodynamics were assessed.  A booted tonsil was used to clamp the graft.  After five minutes, the patient remained stable.   The graft was unclamped and the Impella was retracted from across the aortic valve.  The Impella was turned to P0 and the Impella was removed in its entirety.  The graft was then ligated with cysto clips and the end oversewn with a 3-0 prolene.  The surgical site was irrigated again and hemostasis was obtained.  The axillary incision was then closed in layers.  Gauze strip was packed into the old graft tunnel site.  At the end of the operation, all sponge, instruments, and needle counts were correct.   The patient tolerated the procedure without complication and was transported back to the CTICU in stable condition.

## 2024-03-14 NOTE — Progress Notes (Signed)
   [address] [place] [address]             [phone]    S/p removal of Impella  BP 99/62   Pulse 89   Temp 98.4 F (36.9 C) (Oral)   Resp 16   Ht 5' 10 (1.778 m)   Wt 79.2 kg   SpO2 94%   BMI 25.05 kg/m   Resting comfortably  Lavaughn Bisig C. Kerrin, MD [facility] [phone]

## 2024-03-14 NOTE — Progress Notes (Signed)
 Patient ID: Timothy Nicholson, male   DOB: April 13, 1951, 73 y.o.   MRN: [id number]  6 Days Post-Op Subjective: Pt's catheter drained well overnight with only some pinkish urine at times per nursing.  No clots and did not require irrigation.  Objective: Vital signs in last 24 hours: Temp:  [98.4 F (36.9 C)-99.7 F (37.6 C)] 98.4 F (36.9 C) (12/01 0606) Pulse Rate:  [84-107] 97 (12/01 0700) Resp:  [11-42] 21 (12/01 0700) BP: (89-114)/(53-80) 105/61 (12/01 0700) SpO2:  [90 %-99 %] 93 % (12/01 0700) Weight:  [79.2 kg] 79.2 kg (12/01 0500)  Intake/Output from previous day: 11/30 0701 - 12/01 0700 In: 19244.2 [P.O.:840; I.V.:267.2; IV Piggyback:41.4] Out: 74624 [Urine:25375] Intake/Output this shift: No intake/output data recorded.  Physical Exam:  General: Alert and oriented GU: Urine grossly clear this morning  Lab Results: Recent Labs    03/12/24 1756 03/13/24 0400 03/14/24 0411  HGB 9.5* 8.8* 8.5*  HCT 29.0* 27.0* 26.1*   BMET Recent Labs    03/13/24 0400 03/14/24 0515  NA 137 136  K 3.9 4.2  CL 100 102  CO2 27 27  GLUCOSE 104* 102*  BUN 32* 32*  CREATININE 1.72* 1.43*  CALCIUM  7.7* 7.7*     Studies/Results:   Assessment/Plan: 1) Gross hematuria: S/P cysto/clot evacuation/fulguration 11/25. No clear etiology with negative cystoscopy last week except for maybe a friable prostate exacerbated by antiplatelet therapy.  Now resolving and Hgb stable.   2) BPH: Continue urethral catheter until out of ICU or no longer medical necessary and then can consider voiding trial.  Continue chronic BPH meds including finasteride  and tamsulosin . 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  Hematoma from unclear etiology.  Hgb stabilizing.  Will consider repeat imaging a few months.   LOS: 11 days   Noretta Ferrara 03/14/2024, 7:18 AM

## 2024-03-14 NOTE — Transfer of Care (Signed)
 Immediate Anesthesia Transfer of Care Note  Patient: Timothy Nicholson  Procedure(s) Performed: REMOVAL, CARDIAC ASSIST DEVICE, IMPELLA 5.5  Patient Location: ICU  Anesthesia Type:General  Level of Consciousness: awake, alert , oriented, and patient cooperative  Airway & Oxygen Therapy: Patient Spontanous Breathing and Patient connected to face mask oxygen  Post-op Assessment: Report given to RN, Post -op Vital signs reviewed and stable, Patient moving all extremities, and Patient moving all extremities X 4  Post vital signs: Reviewed and stable  Last Vitals:  Vitals Value Taken Time  BP 109/58 03/14/24 15:52  Temp    Pulse 82 03/14/24 15:57  Resp 18 03/14/24 15:57  SpO2 93 % 03/14/24 15:57  Vitals shown include unfiled device data.  Last Pain:  Vitals:   03/14/24 1200  TempSrc:   PainSc: 0-No pain      Patients Stated Pain Goal: 0 (03/12/24 1200)  Complications: No notable events documented.

## 2024-03-15 ENCOUNTER — Encounter (HOSPITAL_COMMUNITY): Payer: Self-pay

## 2024-03-15 ENCOUNTER — Inpatient Hospital Stay (HOSPITAL_COMMUNITY)

## 2024-03-15 DIAGNOSIS — I5020 Unspecified systolic (congestive) heart failure: Secondary | ICD-10-CM

## 2024-03-15 DIAGNOSIS — I2102 ST elevation (STEMI) myocardial infarction involving left anterior descending coronary artery: Secondary | ICD-10-CM | POA: Diagnosis not present

## 2024-03-15 DIAGNOSIS — R570 Cardiogenic shock: Secondary | ICD-10-CM | POA: Diagnosis not present

## 2024-03-15 LAB — ECHO INTRAOPERATIVE TEE
Height: 70 in
Weight: 3128.77 [oz_av]

## 2024-03-15 LAB — BASIC METABOLIC PANEL WITH GFR
Anion gap: 8 (ref 5–15)
BUN: 32 mg/dL — ABNORMAL HIGH (ref 8–23)
CO2: 23 mmol/L (ref 22–32)
Calcium: 7.7 mg/dL — ABNORMAL LOW (ref 8.9–10.3)
Chloride: 106 mmol/L (ref 98–111)
Creatinine, Ser: 1.31 mg/dL — ABNORMAL HIGH (ref 0.61–1.24)
GFR, Estimated: 57 mL/min — ABNORMAL LOW (ref >60)
Glucose, Bld: 134 mg/dL — ABNORMAL HIGH (ref 70–99)
Potassium: 4.3 mmol/L (ref 3.5–5.1)
Sodium: 137 mmol/L (ref 135–145)

## 2024-03-15 LAB — ECHOCARDIOGRAM LIMITED
Area-P 1/2: 4.54 cm2
Calc EF: 49.2 %
Height: 70 in
S' Lateral: 3.4 cm
Single Plane A2C EF: 42.1 %
Single Plane A4C EF: 54.9 %
Weight: 2786.61 [oz_av]

## 2024-03-15 LAB — CBC
HCT: 24.8 % — ABNORMAL LOW (ref 39.0–52.0)
Hemoglobin: 8 g/dL — ABNORMAL LOW (ref 13.0–17.0)
MCH: 30.4 pg (ref 26.0–34.0)
MCHC: 32.3 g/dL (ref 30.0–36.0)
MCV: 94.3 fL (ref 80.0–100.0)
Platelets: 325 K/uL (ref 150–400)
RBC: 2.63 MIL/uL — ABNORMAL LOW (ref 4.22–5.81)
RDW: 14.7 % (ref 11.5–15.5)
WBC: 10 K/uL (ref 4.0–10.5)
nRBC: 0 % (ref 0.0–0.2)

## 2024-03-15 LAB — LACTATE DEHYDROGENASE: LDH: 358 U/L — ABNORMAL HIGH (ref 105–235)

## 2024-03-15 LAB — MAGNESIUM: Magnesium: 1.9 mg/dL (ref 1.7–2.4)

## 2024-03-15 LAB — COOXEMETRY PANEL
Carboxyhemoglobin: 3.4 % — ABNORMAL HIGH (ref 0.5–1.5)
Methemoglobin: <0.7 % (ref 0.0–1.5)
O2 Saturation: 74.1 %
Total hemoglobin: 8.2 g/dL — ABNORMAL LOW (ref 12.0–16.0)

## 2024-03-15 MED ORDER — MAGNESIUM SULFATE 2 GM/50ML IV SOLN
2.0000 g | Freq: Once | INTRAVENOUS | Status: AC
  Administered 2024-03-15: 2 g via INTRAVENOUS
  Filled 2024-03-15: qty 50

## 2024-03-15 MED ORDER — PERFLUTREN LIPID MICROSPHERE
1.0000 mL | INTRAVENOUS | Status: AC | PRN
  Administered 2024-03-15: 3 mL via INTRAVENOUS

## 2024-03-15 NOTE — Progress Notes (Signed)
 PT Cancellation Note  Patient Details Name: Timothy Nicholson MRN: [id number] DOB: April 11, 1951   Cancelled Treatment:    Reason Eval/Treat Not Completed: Patient declined, no reason specified  Politely declines to work with therapy at this time. States he recently returned to bed and had echo performed. Will attempt again later as schedule permits.  Timothy Nicholson, PT, DPT [facility]  Rehabilitation Services Physical Therapist Office: [phone] Website: [email or web address]   Timothy Nicholson 03/15/2024, 1:32 PM

## 2024-03-15 NOTE — Progress Notes (Signed)
 Advanced Heart Failure Rounding Note Cardiologist: None  Chief Complaint: AMI CS  Patient Profile:    73 y/o male admitted w/ acute anterior STEMI c/b CS requiring Impella CP support, further c/b + RPB/hemorrhagic shock. Given ongoing support needs and inability to wean off chemical inotrope/pressor support, was upgraded to Impella 5.5 on 11/25.   Subjective:    11/25:s/p Impella 5.5; Removed 12/1.  Co-ox 74% on DBA 2.5 Net even. Weight stable.  On CBI. Uorlogy following.   Sitting up in chair. Feeling well. No SOB or CP. Does have some shoulder soreness. Plans to walk today.  Objective:    Weight Range: 79 kg Body mass index is 24.99 kg/m.   Vital Signs:   Temp:  [98.4 F (36.9 C)-98.8 F (37.1 C)] 98.5 F (36.9 C) (12/02 0400) Pulse Rate:  [77-126] 94 (12/02 0648) Resp:  [9-26] 11 (12/02 0648) BP: (74-137)/(43-114) 117/59 (12/02 0648) SpO2:  [68 %-97 %] 94 % (12/02 0648) Weight:  [79 kg] 79 kg (12/02 0500) Last BM Date : 03/13/24  Weight change: Filed Weights   03/13/24 0500 03/14/24 0500 03/15/24 0500  Weight: 79.6 kg 79.2 kg 79 kg   Intake/Output:  Intake/Output Summary (Last 24 hours) at 03/15/2024 0727 Last data filed at 03/15/2024 0656 Gross per 24 hour  Intake 34057.61 ml  Output 65374 ml  Net -567.39 ml    Physical Exam   General: Well appearing. No distress  Cardiac: JVP flat. No murmurs  Extremities: Warm and dry.  No edema.  Neuro: A&O x3. Affect pleasant.   Telemetry   SR 90s (personally reviewed)  Labs    CBC Recent Labs    03/14/24 0411 03/15/24 0410  WBC 9.6 10.0  HGB 8.5* 8.0*  HCT 26.1* 24.8*  MCV 94.2 94.3  PLT 275 325   Basic Metabolic Panel Recent Labs    87/98/74 0515 03/15/24 0410  NA 136 137  K 4.2 4.3  CL 102 106  CO2 27 23  GLUCOSE 102* 134*  BUN 32* 32*  CREATININE 1.43* 1.31*  CALCIUM  7.7* 7.7*  MG 2.1 1.9   Medications:    Scheduled Medications:  amiodarone   200 mg Oral Daily   aspirin  EC  81  mg Oral Daily   Chlorhexidine  Gluconate Cloth  6 each Topical Daily   digoxin  0.0625 mg Oral Daily   feeding supplement  237 mL Oral TID BM   finasteride   5 mg Oral Daily   pantoprazole   40 mg Oral Daily   polyethylene glycol  17 g Oral Daily   QUEtiapine   25 mg Oral QHS   rosuvastatin   20 mg Oral Daily   senna-docusate  2 tablet Oral QHS   spironolactone  12.5 mg Oral Daily   tamsulosin   0.4 mg Oral Daily   ticagrelor   90 mg Oral BID   traZODone   50 mg Oral QHS    Infusions:  sodium chloride  10 mL/hr at 03/15/24 0600   DOBUTamine  2.5 mcg/kg/min (03/15/24 0600)   norepinephrine  (LEVOPHED ) Adult infusion Stopped (03/10/24 1056)   phenylephrine  (NEO-SYNEPHRINE) Adult infusion 60 mcg/min (03/15/24 0600)   promethazine  (PHENERGAN ) injection (IM or IVPB) Stopped (03/03/24 2233)   sodium bicarbonate  25 mEq (Impella PURGE) in dextrose  5 % 1000 mL bag     sodium chloride  irrigation     vasopressin  Stopped (03/10/24 1758)    PRN Medications: sodium chloride , acetaminophen , HYDROmorphone  (DILAUDID ) injection, melatonin, ondansetron  (ZOFRAN ) IV, mouth rinse, oxyCODONE , promethazine  (PHENERGAN ) injection (IM or IVPB), simethicone ,  sodium chloride  flush   Assessment/Plan   1. CAD: Anterior STEMI with occluded proximal LAD; the RCA was not selectively engaged but appeared to be relatively small with proximal occlusion. Now s/p DES to proximal LAD and PTCA to ostial D1.  - no angina - continue ASA/statin/Brilinta  - management of post MI shock as below 2. Cardiogenic shock: Due to anterior MI.  Limited echo with Impella CP showed EF 25% with LAD territory akinesis, normal RV size/mildly decreased systolic function. S/p Impella 5.5 with EF recover and removal 12/1.   - Pocus echo 11/29 EF 35-40% normal RV on Impella P-7 - Pocus echo  1/30 EF 40-45% - Co-ox 76% on DBA 2.5; wean once off pressors - on Neo 60, will switch to NE; wean to off for MAP >65 - continue digoxin  0.0625 mg daily -  stop spiro with pressor use - repeat ltd echo today 3. CKD stage 3: AKI revolved. Baseline creatinine 1.6. Peaked at 2.3. Now at baseline 1.4 - Due to ATN.  4. VT: Peri-STEMI. - VT quiescent  - continue amio 200 mg daily 5. Heme: Hemorrhagic component to shock with hematoma at Impella access site and RP hematoma + hematuria.  CTA showed RP hematoma without active extravasation.  - hgb stable 8.5>8.0 today post procedure. Transfuse Hgb < 8.  6. Pulmonary: Possible aspiration PNA.  - completed 5 day course of Unasyn .  7. Hematuria - Urology managing s/p fulguration of bladder.  - Now on 3-way - continue to hold heparin  8. Thrombocytopenia - resolved 9. Illeus: - resolved 10. Retroperitoneal hematoma - stable   Length of Stay: 59  Kahlee Metivier, NP  03/15/2024, 7:27 AM  Advanced Heart Failure Team Pager [phone] (M-F; 7a - 5p)  Please contact [facility] for night-coverage after hours (5p -7a ) and weekends on [email or web address]

## 2024-03-15 NOTE — Progress Notes (Signed)
 Nutrition Follow-up  DOCUMENTATION CODES:   Not applicable  INTERVENTION:   Continue Ensure Plus High Protein po TID, each supplement provides 350 kcal and 20 grams of protein   NUTRITION DIAGNOSIS:   Inadequate oral intake related to acute illness, nausea, decreased appetite, early satiety as evidenced by per patient/family report.  Improving,   GOAL:   Patient will meet greater than or equal to 90% of their needs  Progressing  MONITOR:   PO intake, Supplement acceptance, Labs, Weight trends, I & O's  REASON FOR ASSESSMENT:   Consult Assessment of nutrition requirement/status  ASSESSMENT:   73 yo male admitted with acute anterior STEMI requiring PCI to LAD, Impella CP Placement; also noted to have RP bleed. PMH includes HTN, GERD  11/20 STEMI with PCI to LAD, Impella CP placement, +RP bleed  11/25 OR: Impella CP exchanged for Impella 5.5 12/01 OR: with Impella 5.5 removal  Impella 5.5 removed yesterday. Currently on DBA at 2.5. Neo-Synephrine at 6+0 mcg/min this AM, MD changed to Levophed -currently at 5 mcg/min MAPs (cuff) >65 Diastolic BPs (cuff) >50  Currently on Heart Healthy diet. Recorded po intake 25-85% of meals. Pt reports he is feeling much better than he was initially, appetite is improving.   Pt is also drinking some Ensure, reports at least 1 per day (bedtime usually). Pt requests to continue TID for now.   Remains on continuous bladder irrigation  Current Wt: 79 kg (174 pounds) Admit Wt: 79.4 kg (175 pounds) Post op Wt (11/21): 89 kg  Net negative around 7 L for admission based on I/O flowsheet  Labs: BUN 32 Creatinine 1.31 Potassium 4.3 (wdl) Magnesium  1.9 (wdl) Sodium 147 (wdl) Serum glucose 134  Meds: Digoxin  Amiodarone   Miralax  daily Senna-docusate daily Flomax  Brilinta    Diet Order:   Diet Order             Diet Heart Room service appropriate? Yes; Fluid consistency: Thin  Diet effective now                    EDUCATION NEEDS:   Education needs have been addressed  Skin:  Skin Assessment: Skin Integrity Issues: Skin Integrity Issues:: Incisions Incisions: Right Chest (closed)  Last BM:  12/01 type 5  Height:   Ht Readings from Last 1 Encounters:  03/03/24 5' 10 (1.778 m)    Weight:   Wt Readings from Last 1 Encounters:  03/15/24 79 kg     BMI:  Body mass index is 24.99 kg/m.  Estimated Nutritional Needs:   Kcal:  2000-2200 kcals  Protein:  110-120 g  Fluid:  1.8 L  Betsey Finger MS, RDN, LDN, CNSC Registered Dietitian 3 Clinical Nutrition RD Inpatient Contact Info in Amion

## 2024-03-15 NOTE — Progress Notes (Signed)
 Echocardiogram 2D Echocardiogram has been performed.  Timothy Nicholson 03/15/2024, 11:02 AM

## 2024-03-16 ENCOUNTER — Other Ambulatory Visit (HOSPITAL_COMMUNITY): Payer: Self-pay

## 2024-03-16 ENCOUNTER — Telehealth (HOSPITAL_COMMUNITY): Payer: Self-pay

## 2024-03-16 ENCOUNTER — Other Ambulatory Visit: Payer: Self-pay

## 2024-03-16 ENCOUNTER — Encounter (HOSPITAL_COMMUNITY): Payer: Self-pay | Admitting: Cardiology

## 2024-03-16 DIAGNOSIS — I2102 ST elevation (STEMI) myocardial infarction involving left anterior descending coronary artery: Secondary | ICD-10-CM | POA: Diagnosis not present

## 2024-03-16 DIAGNOSIS — R570 Cardiogenic shock: Secondary | ICD-10-CM | POA: Diagnosis not present

## 2024-03-16 LAB — BASIC METABOLIC PANEL WITH GFR
Anion gap: 4 — ABNORMAL LOW (ref 5–15)
BUN: 36 mg/dL — ABNORMAL HIGH (ref 8–23)
CO2: 26 mmol/L (ref 22–32)
Calcium: 7.9 mg/dL — ABNORMAL LOW (ref 8.9–10.3)
Chloride: 108 mmol/L (ref 98–111)
Creatinine, Ser: 1.45 mg/dL — ABNORMAL HIGH (ref 0.61–1.24)
GFR, Estimated: 51 mL/min — ABNORMAL LOW (ref >60)
Glucose, Bld: 109 mg/dL — ABNORMAL HIGH (ref 70–99)
Potassium: 4.1 mmol/L (ref 3.5–5.1)
Sodium: 138 mmol/L (ref 135–145)

## 2024-03-16 LAB — MAGNESIUM: Magnesium: 2.1 mg/dL (ref 1.7–2.4)

## 2024-03-16 LAB — CBC
HCT: 28.3 % — ABNORMAL LOW (ref 39.0–52.0)
Hemoglobin: 8.9 g/dL — ABNORMAL LOW (ref 13.0–17.0)
MCH: 29.9 pg (ref 26.0–34.0)
MCHC: 31.4 g/dL (ref 30.0–36.0)
MCV: 95 fL (ref 80.0–100.0)
Platelets: 401 K/uL — ABNORMAL HIGH (ref 150–400)
RBC: 2.98 MIL/uL — ABNORMAL LOW (ref 4.22–5.81)
RDW: 14.9 % (ref 11.5–15.5)
WBC: 12.9 K/uL — ABNORMAL HIGH (ref 4.0–10.5)
nRBC: 0 % (ref 0.0–0.2)

## 2024-03-16 LAB — COOXEMETRY PANEL
Carboxyhemoglobin: 3.2 % — ABNORMAL HIGH (ref 0.5–1.5)
Methemoglobin: <0.7 % (ref 0.0–1.5)
O2 Saturation: 74.6 %
Total hemoglobin: 8.3 g/dL — ABNORMAL LOW (ref 12.0–16.0)

## 2024-03-16 LAB — LACTATE DEHYDROGENASE: LDH: 370 U/L — ABNORMAL HIGH (ref 105–235)

## 2024-03-16 LAB — DIGOXIN LEVEL: Digoxin Level: <0.6 ng/mL — ABNORMAL LOW (ref 0.8–2.0)

## 2024-03-16 NOTE — Progress Notes (Signed)
 Occupational Therapy Treatment Patient Details Name: Timothy Nicholson MRN: [id number] DOB: October 25, 1950 Today's Date: 03/16/2024   History of present illness 73 yo M adm 03/03/24 with chest pain, STEMI, emergent LHC with PCI of LAD, hypotensive, Impella CP. Cardiogenic shock 11/25 with impella 5.5 insertion and cystoscopy for hematuria. Intubated 11/25-11/26. Impella removed 12/01. PMhx: HTN, HLD, BPH, CKD   OT comments  Pt progressing well towards goals. Progressed to complete toileting and standing ADLs with S for safety. Pt improved activity tolerance to tolerating 10 minutes of activity at supervision level. Unable to obtain true o2 sats during mobility d/t inconsistent pleth, at rest after activity O2 sats at 98%. Pt denies any distress during mobility. Continue to recommend No follow up OT . Will continue to follow acutely.       If plan is discharge home, recommend the following:  A little help with walking and/or transfers;A little help with bathing/dressing/bathroom;Assistance with cooking/housework;Assist for transportation   Equipment Recommendations  BSC/3in1;Tub/shower seat       Precautions / Restrictions Precautions Precautions: Fall Recall of Precautions/Restrictions: Intact Restrictions Weight Bearing Restrictions Per Provider Order: No       Mobility Bed Mobility Overal bed mobility: Needs Assistance Bed Mobility: Supine to Sit     Supine to sit: Min assist     General bed mobility comments: Min HH assist for trunk at pt's request    Transfers Overall transfer level: Needs assistance Equipment used: Rolling walker (2 wheels) Transfers: Sit to/from Stand Sit to Stand: Supervision           General transfer comment: Cues for hand placement on RW     Balance Overall balance assessment: Mild deficits observed, not formally tested         ADL either performed or assessed with clinical judgement   ADL Overall ADL's : Needs assistance/impaired      Grooming: Wash/dry hands;Supervision/safety       Toilet Transfer: Supervision/safety;+2 for safety/equipment;Ambulation;Regular Toilet;Rolling walker (2 wheels);Grab bars Toilet Transfer Details (indicate cue type and reason): Cues for use of GB Toileting- Clothing Manipulation and Hygiene: Supervision/safety;Sitting/lateral lean;Sit to/from stand       Functional mobility during ADLs: Supervision/safety;Rolling walker (2 wheels) General ADL Comments: Limited by decreased activity tolerance    Extremity/Trunk Assessment Upper Extremity Assessment Upper Extremity Assessment: Generalized weakness   Lower Extremity Assessment Lower Extremity Assessment: Defer to PT evaluation        Vision   Vision Assessment?: No apparent visual deficits         Communication Communication Communication: No apparent difficulties   Cognition Arousal: Alert Behavior During Therapy: WFL for tasks assessed/performed Cognition: No apparent impairments     Following commands: Intact        Cueing   Cueing Techniques: Verbal cues        General Comments VSS on RA. Inconsistent O2 pleth during mobility. O2 while resting at 98%    Pertinent Vitals/ Pain       Pain Assessment Pain Assessment: No/denies pain Pain Intervention(s): Monitored during session   Frequency  Min 2X/week        Progress Toward Goals  OT Goals(current goals can now be found in the care plan section)  Progress towards OT goals: Progressing toward goals  Acute Rehab OT Goals Patient Stated Goal: To go home OT Goal Formulation: With patient Time For Goal Achievement: 03/25/24 Potential to Achieve Goals: Good ADL Goals Pt Will Perform Grooming: with modified independence;standing Pt Will Perform Lower Body  Dressing: with modified independence;sit to/from stand Pt Will Transfer to Toilet: with modified independence;ambulating;regular height toilet Pt Will Perform Toileting - Clothing Manipulation and  hygiene: with modified independence;sit to/from stand Pt Will Perform Tub/Shower Transfer: Tub transfer;with modified independence;shower seat;ambulating;3 in 1 Additional ADL Goal #1: Pt will verbalize at least 3 energy conservations strategies for ADLs Additional ADL Goal #2: Pt will tolerate at least 15 minutes of OOB activitites at mod I to increase activity tolerance  Plan         AM-PAC OT 6 Clicks Daily Activity     Outcome Measure   Help from another person eating meals?: None Help from another person taking care of personal grooming?: A Little Help from another person toileting, which includes using toliet, bedpan, or urinal?: A Little Help from another person bathing (including washing, rinsing, drying)?: A Little Help from another person to put on and taking off regular upper body clothing?: A Little Help from another person to put on and taking off regular lower body clothing?: A Little 6 Click Score: 19    End of Session Equipment Utilized During Treatment: Gait belt;Rolling walker (2 wheels)  OT Visit Diagnosis: Unsteadiness on feet (R26.81);Other abnormalities of gait and mobility (R26.89);Muscle weakness (generalized) (M62.81)   Activity Tolerance Patient tolerated treatment well   Patient Left in chair;with call bell/phone within reach   Nurse Communication Mobility status        Time: 9254-9182 OT Time Calculation (min): 32 min  Charges: OT General Charges $OT Visit: 1 Visit OT Treatments $Self Care/Home Management : 23-37 mins  Adrianne BROCKS, OT  Acute Rehabilitation Services Office [phone] Secure chat preferred   Adrianne GORMAN Savers 03/16/2024, 8:26 AM

## 2024-03-16 NOTE — TOC Initial Note (Addendum)
 Transition of Care [facility]) - Initial/Assessment Note    Patient Details  Name: Timothy Nicholson MRN: [id number] Date of Birth: 04/20/1950  Transition of Care [facility]) CM/SW Contact:    Justina Delcia Czar, RN Phone Number: [phone] 03/16/2024, 8:02 AM  Clinical Narrative:                 Spoke to pt and wife, pt was independent pta.   Will need PCP hospital follow up appt at dc.   Benefits check for Brillinta. Copay $45.00  Chart reviewed for discharge readiness, patient not medically stable for d/c. Inpatient CM/CSW will continue to monitor pt's advancement through interdisciplinary progression rounds.   If new pt transition needs arise, MD please place a TOC consult.    Expected Discharge Plan: Home/Self Care Barriers to Discharge: Continued Medical Work up   Patient Goals and CMS Choice Patient states their goals for this hospitalization and ongoing recovery are:: wants to recover          Expected Discharge Plan and Services   Discharge Planning Services: CM Consult   Living arrangements for the past 2 months: Single Family Home                                      Prior Living Arrangements/Services Living arrangements for the past 2 months: Single Family Home Lives with:: Spouse Patient language and need for interpreter reviewed:: Yes Do you feel safe going back to the place where you live?: Yes      Need for Family Participation in Patient Care: No (Comment) Care giver support system in place?: Yes (comment)   Criminal Activity/Legal Involvement Pertinent to Current Situation/Hospitalization: No - Comment as needed  Activities of Daily Living      Permission Sought/Granted Permission sought to share information with : Case Manager, Family Supports, PCP Permission granted to share information with : Yes, Verbal Permission Granted  Share Information with NAME: Debora Fantini  Permission granted to share info w AGENCY: PcP, DME  Permission granted to  share info w Relationship: wife  Permission granted to share info w Contact Information: [phone]  Emotional Assessment Appearance:: Appears stated age Attitude/Demeanor/Rapport: Engaged Affect (typically observed): Accepting Orientation: : Oriented to Self, Oriented to Place, Oriented to  Time, Oriented to Situation   Psych Involvement: No (comment)  Admission diagnosis:  STEMI involving left anterior descending coronary artery [facility]) [I21.02] Patient Active Problem List   Diagnosis Date Noted   STEMI involving left anterior descending coronary artery (HCC) 03/03/2024   Cardiogenic shock (HCC) 03/03/2024   Macular hole, left eye 09/20/2019   Macular hole, left 09/05/2019   Genetic testing 02/01/2018   Personal history of colon cancer 01/19/2018   Family history of colon cancer    Family history of lung cancer    Family history of breast cancer    Family history of prostate cancer    Family history of pancreatic cancer    PCP:  [name] ORN, MD Pharmacy:   [facility] [phone] [address], [place] - [address] [place] [address] Phone: [phone] Fax: [phone]  [facility] [address] [place] [address] Phone: [phone] Fax: [phone]     Social Drivers of Health (SDOH) Social History: SDOH Screenings   Food Insecurity: No Food Insecurity (03/04/2024)  Housing: Low Risk  (03/04/2024)  Transportation Needs:  No Transportation Needs (03/04/2024)  Utilities: Not At Risk (03/04/2024)  Financial Resource Strain: Low Risk  (08/04/2023)   Received from [facility]  Social Connections: Socially Integrated (03/10/2024)  Tobacco Use: Low Risk  (03/03/2024)   SDOH Interventions:     Readmission Risk Interventions     No data to display

## 2024-03-16 NOTE — Plan of Care (Signed)
  Problem: Education: Goal: Knowledge of General Education information will improve Description: Including pain rating scale, medication(s)/side effects and non-pharmacologic comfort measures Outcome: Progressing   Problem: Health Behavior/Discharge Planning: Goal: Ability to manage health-related needs will improve Outcome: Progressing   Problem: Clinical Measurements: Goal: Ability to maintain clinical measurements within normal limits will improve Outcome: Progressing Goal: Will remain free from infection Outcome: Progressing Goal: Diagnostic test results will improve Outcome: Progressing Goal: Respiratory complications will improve Outcome: Progressing Goal: Cardiovascular complication will be avoided Outcome: Progressing   Problem: Activity: Goal: Risk for activity intolerance will decrease Outcome: Progressing   Problem: Nutrition: Goal: Adequate nutrition will be maintained Outcome: Progressing   Problem: Coping: Goal: Level of anxiety will decrease Outcome: Progressing   Problem: Elimination: Goal: Will not experience complications related to bowel motility Outcome: Progressing Goal: Will not experience complications related to urinary retention Outcome: Progressing   Problem: Pain Managment: Goal: General experience of comfort will improve and/or be controlled Outcome: Progressing   Problem: Safety: Goal: Ability to remain free from injury will improve Outcome: Progressing   Problem: Skin Integrity: Goal: Risk for impaired skin integrity will decrease Outcome: Progressing   Problem: Education: Goal: Understanding of CV disease, CV risk reduction, and recovery process will improve Outcome: Progressing Goal: Individualized Educational Video(s) Outcome: Progressing   Problem: Activity: Goal: Ability to return to baseline activity level will improve Outcome: Progressing   Problem: Cardiovascular: Goal: Ability to achieve and maintain adequate  cardiovascular perfusion will improve Outcome: Progressing Goal: Vascular access site(s) Level 0-1 will be maintained Outcome: Progressing   Problem: Health Behavior/Discharge Planning: Goal: Ability to safely manage health-related needs after discharge will improve Outcome: Progressing   Problem: Education: Goal: Ability to describe self-care measures that may prevent or decrease complications (Diabetes Survival Skills Education) will improve Outcome: Progressing Goal: Individualized Educational Video(s) Outcome: Progressing   Problem: Coping: Goal: Ability to adjust to condition or change in health will improve Outcome: Progressing   Problem: Fluid Volume: Goal: Ability to maintain a balanced intake and output will improve Outcome: Progressing   Problem: Health Behavior/Discharge Planning: Goal: Ability to identify and utilize available resources and services will improve Outcome: Progressing Goal: Ability to manage health-related needs will improve Outcome: Progressing   Problem: Metabolic: Goal: Ability to maintain appropriate glucose levels will improve Outcome: Progressing   Problem: Nutritional: Goal: Maintenance of adequate nutrition will improve Outcome: Progressing Goal: Progress toward achieving an optimal weight will improve Outcome: Progressing

## 2024-03-16 NOTE — Telephone Encounter (Signed)
 Pharmacy Patient Advocate Encounter  Insurance verification completed.    The patient is insured through [facility]. Patient has Medicare and is not eligible for a copay card, but [place] be able to apply for patient assistance or Medicare RX Payment Plan (Patient Must reach out to their plan, if eligible for payment plan), if available.    Ran test claim for Generic Brilinta  90mg  and the current 30 day co-pay is $45.   This test claim was processed through [facility]- copay amounts [place] vary at other pharmacies due to [employer], or as the patient moves through the different stages of their insurance plan.

## 2024-03-16 NOTE — Progress Notes (Signed)
 Advanced Heart Failure Rounding Note Cardiologist: None  Chief Complaint: Hematuria Patient Profile:    73 y/o male admitted w/ acute anterior STEMI c/b CS requiring Impella CP support, further c/b + RPB/hemorrhagic shock. Given ongoing support needs and inability to wean off chemical inotrope/pressor support, was upgraded to Impella 5.5 on 11/25.   Subjective:    11/25:s/p Impella 5.5; Removed 12/1.  Co-ox 75% on DBA 2.5 Soft BP overnight. SBP 80-90s. Weight stable. On CBI. Urology following. sCr 1.43 stable.  Biggest complaint is foley catheter. Walked around the unit this morning. No pain or shortness of breath. Did have some nausea overnight that resolved with zofran .   Objective:    Weight Range: 79.6 kg Body mass index is 25.18 kg/m.   Vital Signs:   Temp:  [97.8 F (36.6 C)-98.3 F (36.8 C)] 98.3 F (36.8 C) (12/03 0800) Pulse Rate:  [74-108] 95 (12/03 0800) Resp:  [0-26] 17 (12/03 0800) BP: (81-123)/(45-88) 122/74 (12/03 0800) SpO2:  [86 %-100 %] 97 % (12/03 0800) Weight:  [79.6 kg] 79.6 kg (12/03 0500) Last BM Date : 03/15/24  Weight change: Filed Weights   03/14/24 0500 03/15/24 0500 03/16/24 0500  Weight: 79.2 kg 79 kg 79.6 kg   Intake/Output:  Intake/Output Summary (Last 24 hours) at 03/16/2024 0848 Last data filed at 03/16/2024 0800 Gross per 24 hour  Intake 87631 ml  Output 18600 ml  Net -6232 ml    Physical Exam   General: Well appearing. No distress  Cardiac: JVP flat. No murmurs  Extremities: Warm and dry.  No edema.  Neuro: A&O x3. Affect pleasant.   Telemetry   SR 80-90s (personally reviewed)  Labs    CBC Recent Labs    03/14/24 0411 03/15/24 0410  WBC 9.6 10.0  HGB 8.5* 8.0*  HCT 26.1* 24.8*  MCV 94.2 94.3  PLT 275 325   Basic Metabolic Panel Recent Labs    87/97/74 0410 03/16/24 0432  NA 137 138  K 4.3 4.1  CL 106 108  CO2 23 26  GLUCOSE 134* 109*  BUN 32* 36*  CREATININE 1.31* 1.45*  CALCIUM  7.7* 7.9*  MG  1.9 2.1   Medications:    Scheduled Medications:  amiodarone   200 mg Oral Daily   aspirin  EC  81 mg Oral Daily   Chlorhexidine  Gluconate Cloth  6 each Topical Daily   digoxin  0.0625 mg Oral Daily   feeding supplement  237 mL Oral TID BM   finasteride   5 mg Oral Daily   pantoprazole   40 mg Oral Daily   polyethylene glycol  17 g Oral Daily   QUEtiapine   25 mg Oral QHS   rosuvastatin   20 mg Oral Daily   senna-docusate  2 tablet Oral QHS   tamsulosin   0.4 mg Oral Daily   ticagrelor   90 mg Oral BID   traZODone   50 mg Oral QHS    Infusions:  sodium chloride  10 mL/hr at 03/16/24 0800   DOBUTamine  2.5 mcg/kg/min (03/16/24 0800)   norepinephrine  (LEVOPHED ) Adult infusion Stopped (03/15/24 1721)   promethazine  (PHENERGAN ) injection (IM or IVPB) Stopped (03/03/24 2233)   sodium chloride  irrigation     vasopressin  Stopped (03/10/24 1758)    PRN Medications: sodium chloride , acetaminophen , HYDROmorphone  (DILAUDID ) injection, melatonin, ondansetron  (ZOFRAN ) IV, mouth rinse, oxyCODONE , promethazine  (PHENERGAN ) injection (IM or IVPB), simethicone , sodium chloride  flush   Assessment/Plan   1. CAD: Anterior STEMI with occluded proximal LAD; the RCA was not selectively engaged but appeared to  be relatively small with proximal occlusion. Now s/p DES to proximal LAD and PTCA to ostial D1.  - no angina - continue ASA/statin/Brilinta  2. Cardiogenic shock: Resolved. Due to anterior MI.  Limited echo with Impella CP showed EF 25% with LAD territory akinesis, normal RV size/mildly decreased systolic function. S/p Impella 5.5 with EF recover and removal 12/1. Repeat echo with recovered EF 45-50%, nl RV.  - Pocus echo 11/29 EF 35-40% normal RV on Impella P-7 - Pocus echo  1/30 EF 40-45% - Co-ox 75% on DBA 2.5; stop today - euvolemic on exam; hold diuretics - continue digoxin 0.0625 mg daily - hold on restarting spiro with hypotension 3. CKD stage 3: AKI revolved. Baseline creatinine 1.6. Peaked at  2.3. Now at baseline 1.4 - Due to ATN.  4. VT: Peri-STEMI. - VT quiescent  - continue amio 200 mg daily 5. Heme: Hemorrhagic component to shock with hematoma at Impella access site and RP hematoma + hematuria.  CTA showed RP hematoma without active extravasation.  - hgb stable 8.5>8.0>8.9 Transfuse Hgb < 8.  6. Pulmonary: Possible aspiration PNA.  - completed 5 day course of Unasyn .  7. Hematuria - Urology managing s/p fulguration of bladder.  - Now on 3-way; stop CBI per Urology 8. Thrombocytopenia - resolved 9. Illeus: - resolved 10. Retroperitoneal hematoma - stable   Stable for transfer out of the ICU.   Length of Stay: 67  Tailyn Hantz, NP  03/16/2024, 8:48 AM  Advanced Heart Failure Team Pager [phone] (M-F; 7a - 5p)  Please contact [facility] for night-coverage after hours (5p -7a ) and weekends on [email or web address]

## 2024-03-16 NOTE — Progress Notes (Signed)
 Physical Therapy Treatment Patient Details Name: Timothy Nicholson Timothy Nicholson MRN: [id number] DOB: 02/22/1951 Today's Date: 03/16/2024   History of Present Illness 73 yo M adm 03/03/24 with chest pain, STEMI, emergent LHC with PCI of LAD, hypotensive, Impella CP. Cardiogenic shock 11/25 with impella 5.5 insertion and cystoscopy for hematuria. Intubated 11/25-11/26. Impella removed 12/01. PMhx: HTN, HLD, BPH, CKD    PT Comments  Pt progressing well towards goals. Currently pt is Min A for bed mobility and sit to stand. CGA for 600 ft of gait with RW. Pt with increased time to ambulate due to fatigue with no standing rest breaks. Due to pt current functional status, home set up and available assistance at home recommending skilled physical therapy services 3x/week in order to address strength, balance and functional mobility to decrease risk for falls, injury and re-hospitalization.       If plan is discharge home, recommend the following: A little help with walking and/or transfers;Assistance with cooking/housework;Assist for transportation;Help with stairs or ramp for entrance     Equipment Recommendations  Rolling walker (2 wheels);BSC/3in1       Precautions / Restrictions Precautions Precautions: Fall Recall of Precautions/Restrictions: Intact Restrictions Weight Bearing Restrictions Per Provider Order: No RLE Weight Bearing Per Provider Order: Non weight bearing     Mobility  Bed Mobility Overal bed mobility: Needs Assistance Bed Mobility: Supine to Sit     Supine to sit: Min assist     General bed mobility comments: Min A for trunk to mid line    Transfers Overall transfer level: Needs assistance Equipment used: Rolling walker (2 wheels) Transfers: Sit to/from Stand Sit to Stand: Supervision           General transfer comment: Cues for hand placement on RW    Ambulation/Gait Ambulation/Gait assistance: Contact guard assist Gait Distance (Feet): 600 Feet Assistive device:  Rolling walker (2 wheels) Gait Pattern/deviations: Step-through pattern, Decreased stride length Gait velocity: mildly decreased Gait velocity interpretation: >2.62 ft/sec, indicative of community ambulatory          Balance Overall balance assessment: Mild deficits observed, not formally tested      Communication Communication Communication: No apparent difficulties  Cognition Arousal: Alert Behavior During Therapy: WFL for tasks assessed/performed   PT - Cognitive impairments: No apparent impairments     Following commands: Intact      Cueing Cueing Techniques: Verbal cues     General Comments General comments (skin integrity, edema, etc.): vital signs stable on room air. Inconsistent O2 pleth during mobility; with good reading O2 sats in mid to upper 90's      Pertinent Vitals/Pain Pain Assessment Pain Assessment: No/denies pain     PT Goals (current goals can now be found in the care plan section) Acute Rehab PT Goals Patient Stated Goal: return home and to work PT Goal Formulation: With patient Time For Goal Achievement: 03/24/24 Potential to Achieve Goals: Good Progress towards PT goals: Progressing toward goals    Frequency    Min 2X/week      PT Plan  Continue with current POC        AM-PAC PT 6 Clicks Mobility   Outcome Measure  Help needed turning from your back to your side while in a flat bed without using bedrails?: A Little Help needed moving from lying on your back to sitting on the side of a flat bed without using bedrails?: A Little Help needed moving to and from a bed to a chair (including a wheelchair)?:  A Little Help needed standing up from a chair using your arms (e.g., wheelchair or bedside chair)?: A Little Help needed to walk in hospital room?: A Little Help needed climbing 3-5 steps with a railing? : A Little 6 Click Score: 18    End of Session Equipment Utilized During Treatment: Gait belt Activity Tolerance: Patient  tolerated treatment well Patient left: in chair;with call bell/phone within reach Nurse Communication: Mobility status;Precautions PT Visit Diagnosis: Other abnormalities of gait and mobility (R26.89);Difficulty in walking, not elsewhere classified (R26.2)     Time: 8583-8558 PT Time Calculation (min) (ACUTE ONLY): 25 min  Charges:    $Therapeutic Activity: 23-37 mins PT General Charges $$ ACUTE PT VISIT: 1 Visit                    Dorothyann Maier, DPT, CLT  Acute Rehabilitation Services Office: [phone] (Secure chat preferred)    Dorothyann VEAR Maier 03/16/2024, 3:50 PM

## 2024-03-16 NOTE — Progress Notes (Signed)
 2 Days Post-Op Subjective: First time meeting Timothy Nicholson.  Weaned from dobutamine .  Hopefully out of ICU soon.  CBI on low gtt.  Clear irrigant  Objective: Vital signs in last 24 hours: Temp:  [97.8 F (36.6 C)-98.3 F (36.8 C)] 98.3 F (36.8 C) (12/03 0800) Pulse Rate:  [74-108] 95 (12/03 0800) Resp:  [0-26] 17 (12/03 0800) BP: (81-123)/(45-88) 122/74 (12/03 0800) SpO2:  [86 %-100 %] 97 % (12/03 0800) Weight:  [79.6 kg] 79.6 kg (12/03 0500)  Assessment/Plan: # Gross hematuria  Still having some occasional hematuria with ambulation while on blood thinners.  This is almost certainly from minor traumas from the stiffer hematuria catheter coupled with systemic anticoagulation.  As long as his hemoglobin is stable, small amount of blushing in the urine with ambulation, straining to have a bowel movement, etc is not worrisome.  Catheter was not secured on rounds which will contribute to his bleeding with ambulation.  Placed back in [employer] and reviewed with nursing.  S/p clot evacuation and fulguration with Dr. Carolee on 03/08/2024.  Hand irrigate as needed.  No clot material with hand irrigation today  Continue Flomax  and finasteride   CBI clamped on rounds  Consider prostate artery embolization if ongoing bleeding  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  Unclear etiology. Hgb stable.  Last transfusion 11/25.  Interval improvement in Hgb 8.0 -> 8.9    Intake/Output from previous day: 12/02 0701 - 12/03 0700 In: 86404 [P.O.:480; I.V.:515] Out: 19600 [Urine:19600]  Intake/Output this shift: Total I/O In: 13 [I.V.:13] Out: -   Physical Exam:  General: Alert and oriented CV: No cyanosis Lungs: equal chest rise Gu: Three-way hematuria catheter in place with clear irrigant.  CBI clamped on rounds.  Lab Results: Recent Labs    03/14/24 0411 03/15/24 0410 03/16/24 0912  HGB 8.5*  8.0* 8.9*  HCT 26.1* 24.8* 28.3*   BMET Recent Labs    03/15/24 0410 03/16/24 0432 03/16/24 0912  NA 137 138  --   K 4.3 4.1  --   CL 106 108  --   CO2 23 26  --   GLUCOSE 134* 109*  --   BUN 32* 36*  --   CREATININE 1.31* 1.45*  --   CALCIUM  7.7* 7.9*  --   HGB 8.0*  --  8.9*  WBC 10.0  --  12.9*     Studies/Results: ECHOCARDIOGRAM LIMITED Result Date: 03/15/2024    ECHOCARDIOGRAM LIMITED REPORT   Patient Name:   Timothy Nicholson Date of Exam: 03/15/2024 Medical Rec #:  [id number]   Height:       70.0 in Accession #:    [id number]  Weight:       174.2 lb Date of Birth:  05/11/1950  BSA:          1.968 m Patient Age:    73 years    BP:           104/59 mmHg Patient Gender: M           HR:           91 bpm. Exam Location:  Inpatient Procedure: Limited Echo, Cardiac Doppler and Intracardiac Opacification Agent            (Both Spectral and Color Flow Doppler were utilized during            procedure). Indications:    Congestive  heart failure 150.9  History:        Patient has prior history of Echocardiogram examinations, most                 recent 03/12/2024. Previous Myocardial Infarction and CAD,                 shock; Risk Factors:Hypertension.  Sonographer:    Merlynn Argyle Referring Phys: [id number] JORDAN LEE IMPRESSIONS  1. Left ventricular ejection fraction, by estimation, is 45 to 50%. The left ventricle has mildly decreased function. The left ventricle demonstrates global hypokinesis. Left ventricular diastolic parameters are indeterminate.  2. Right ventricular systolic function is normal. The right ventricular size is normal.  3. The mitral valve is normal in structure. No evidence of mitral valve regurgitation. No evidence of mitral stenosis.  4. The aortic valve is normal in structure. Aortic valve regurgitation is not visualized. No aortic stenosis is present.  5. The inferior vena cava is normal in size with greater than 50% respiratory variability, suggesting right atrial pressure of 3  mmHg. FINDINGS  Left Ventricle: Left ventricular ejection fraction, by estimation, is 45 to 50%. The left ventricle has mildly decreased function. The left ventricle demonstrates global hypokinesis. Definity contrast agent was given IV to delineate the left ventricular  endocardial borders. The left ventricular internal cavity size was normal in size. There is no left ventricular hypertrophy. Left ventricular diastolic parameters are indeterminate.  LV Wall Scoring: The apical septal segment, apical inferior segment, and apex are akinetic. The entire anterior wall, entire lateral wall, anterior septum, inferior wall, mid inferoseptal segment, and basal inferoseptal segment are hypokinetic. Right Ventricle: The right ventricular size is normal. No increase in right ventricular wall thickness. Right ventricular systolic function is normal. Left Atrium: Left atrial size was normal in size. Right Atrium: Right atrial size was normal in size. Pericardium: Trivial pericardial effusion is present. The pericardial effusion is circumferential. Mitral Valve: The mitral valve is normal in structure. No evidence of mitral valve stenosis. Tricuspid Valve: The tricuspid valve is normal in structure. Tricuspid valve regurgitation is not demonstrated. No evidence of tricuspid stenosis. Aortic Valve: The aortic valve is normal in structure. Aortic valve regurgitation is not visualized. No aortic stenosis is present. Pulmonic Valve: The pulmonic valve was normal in structure. Pulmonic valve regurgitation is not visualized. No evidence of pulmonic stenosis. Aorta: The aortic root is normal in size and structure. Venous: The inferior vena cava is normal in size with greater than 50% respiratory variability, suggesting right atrial pressure of 3 mmHg. IAS/Shunts: No atrial level shunt detected by color flow Doppler. Additional Comments: Spectral Doppler performed.  LEFT VENTRICLE PLAX 2D LVIDd:         5.20 cm      Diastology LVIDs:          3.40 cm      LV e' medial:    9.48 cm/s LV PW:         1.00 cm      LV E/e' medial:  11.1 LV IVS:        1.00 cm      LV e' lateral:   12.40 cm/s                             LV E/e' lateral: 8.5  LV Volumes (MOD) LV vol d, MOD A2C: 154.0 ml LV vol d, MOD A4C: 202.0 ml LV vol s, MOD  A2C: 89.2 ml LV vol s, MOD A4C: 91.1 ml LV SV MOD A2C:     64.8 ml LV SV MOD A4C:     202.0 ml LV SV MOD BP:      87.4 ml RIGHT VENTRICLE RV S prime:     20.20 cm/s TAPSE (M-mode): 2.0 cm MITRAL VALVE MV Area (PHT): 4.54 cm MV Decel Time: 167 msec MV E velocity: 105.00 cm/s MV A velocity: 83.60 cm/s MV E/A ratio:  1.26 Kardie Tobb DO Electronically signed by Dub Huntsman DO Signature Date/Time: 03/15/2024/12:55:15 PM    Final       LOS: 13 days   Ole Bourdon, NP [facility] Pager: [phone]  03/16/2024, 9:55 AM

## 2024-03-17 ENCOUNTER — Other Ambulatory Visit (HOSPITAL_COMMUNITY): Payer: Self-pay

## 2024-03-17 DIAGNOSIS — R570 Cardiogenic shock: Secondary | ICD-10-CM | POA: Diagnosis not present

## 2024-03-17 DIAGNOSIS — I2102 ST elevation (STEMI) myocardial infarction involving left anterior descending coronary artery: Secondary | ICD-10-CM | POA: Diagnosis not present

## 2024-03-17 LAB — BASIC METABOLIC PANEL WITH GFR
Anion gap: 7 (ref 5–15)
BUN: 34 mg/dL — ABNORMAL HIGH (ref 8–23)
CO2: 25 mmol/L (ref 22–32)
Calcium: 7.9 mg/dL — ABNORMAL LOW (ref 8.9–10.3)
Chloride: 104 mmol/L (ref 98–111)
Creatinine, Ser: 1.47 mg/dL — ABNORMAL HIGH (ref 0.61–1.24)
GFR, Estimated: 50 mL/min — ABNORMAL LOW (ref >60)
Glucose, Bld: 99 mg/dL (ref 70–99)
Potassium: 4.2 mmol/L (ref 3.5–5.1)
Sodium: 136 mmol/L (ref 135–145)

## 2024-03-17 LAB — COOXEMETRY PANEL
Carboxyhemoglobin: 3.6 % — ABNORMAL HIGH (ref 0.5–1.5)
Methemoglobin: <0.7 % (ref 0.0–1.5)
O2 Saturation: 72.2 %
Total hemoglobin: 7.6 g/dL — ABNORMAL LOW (ref 12.0–16.0)

## 2024-03-17 LAB — CBC
HCT: 25.9 % — ABNORMAL LOW (ref 39.0–52.0)
Hemoglobin: 8.2 g/dL — ABNORMAL LOW (ref 13.0–17.0)
MCH: 29.6 pg (ref 26.0–34.0)
MCHC: 31.7 g/dL (ref 30.0–36.0)
MCV: 93.5 fL (ref 80.0–100.0)
Platelets: 384 K/uL (ref 150–400)
RBC: 2.77 MIL/uL — ABNORMAL LOW (ref 4.22–5.81)
RDW: 14.8 % (ref 11.5–15.5)
WBC: 9.4 K/uL (ref 4.0–10.5)
nRBC: 0 % (ref 0.0–0.2)

## 2024-03-17 LAB — IRON AND TIBC
Iron: 24 ug/dL — ABNORMAL LOW (ref 45–182)
Saturation Ratios: 13 % — ABNORMAL LOW (ref 17.9–39.5)
TIBC: 183 ug/dL — ABNORMAL LOW (ref 250–450)
UIBC: 159 ug/dL

## 2024-03-17 LAB — FERRITIN: Ferritin: 712 ng/mL — ABNORMAL HIGH (ref 24–336)

## 2024-03-17 LAB — MAGNESIUM: Magnesium: 2 mg/dL (ref 1.7–2.4)

## 2024-03-17 MED ORDER — TICAGRELOR 90 MG PO TABS
90.0000 mg | ORAL_TABLET | Freq: Two times a day (BID) | ORAL | 6 refills | Status: AC
  Filled 2024-03-17: qty 60, 30d supply, fill #0

## 2024-03-17 MED ORDER — ASPIRIN 81 MG PO TBEC
81.0000 mg | DELAYED_RELEASE_TABLET | Freq: Every day | ORAL | 12 refills | Status: AC
  Filled 2024-03-17: qty 30, 30d supply, fill #0

## 2024-03-17 MED ORDER — SENNOSIDES-DOCUSATE SODIUM 8.6-50 MG PO TABS
1.0000 | ORAL_TABLET | Freq: Every day | ORAL | 6 refills | Status: AC
  Filled 2024-03-17: qty 30, 30d supply, fill #0

## 2024-03-17 MED ORDER — ROSUVASTATIN CALCIUM 20 MG PO TABS
20.0000 mg | ORAL_TABLET | Freq: Every day | ORAL | 6 refills | Status: DC
  Filled 2024-03-17: qty 30, 30d supply, fill #0

## 2024-03-17 MED ORDER — AMIODARONE HCL 200 MG PO TABS
200.0000 mg | ORAL_TABLET | Freq: Every day | ORAL | 6 refills | Status: DC
  Filled 2024-03-17: qty 30, 30d supply, fill #0

## 2024-03-17 MED ORDER — DIGOXIN 125 MCG PO TABS
62.5000 ug | ORAL_TABLET | Freq: Every day | ORAL | 6 refills | Status: DC
  Filled 2024-03-17: qty 15, 30d supply, fill #0

## 2024-03-17 NOTE — Plan of Care (Signed)
  Problem: Education: Goal: Knowledge of General Education information will improve Description: Including pain rating scale, medication(s)/side effects and non-pharmacologic comfort measures Outcome: Progressing   Problem: Health Behavior/Discharge Planning: Goal: Ability to manage health-related needs will improve Outcome: Progressing   Problem: Clinical Measurements: Goal: Ability to maintain clinical measurements within normal limits will improve Outcome: Progressing Goal: Respiratory complications will improve Outcome: Progressing Goal: Cardiovascular complication will be avoided Outcome: Progressing   Problem: Nutrition: Goal: Adequate nutrition will be maintained Outcome: Progressing   Problem: Elimination: Goal: Will not experience complications related to bowel motility Outcome: Progressing Goal: Will not experience complications related to urinary retention Outcome: Progressing   Problem: Pain Managment: Goal: General experience of comfort will improve and/or be controlled Outcome: Progressing   Problem: Safety: Goal: Ability to remain free from injury will improve Outcome: Progressing   Problem: Skin Integrity: Goal: Risk for impaired skin integrity will decrease Outcome: Progressing

## 2024-03-17 NOTE — Care Management Important Message (Signed)
 Important Message  Patient Details  Name: Timothy Nicholson MRN: [id number] Date of Birth: 05-07-1950   Important Message Given:  Yes - Medicare IM     Claretta Deed 03/17/2024, 3:30 PM

## 2024-03-17 NOTE — TOC Progression Note (Signed)
 Transition of Care [facility]) - Progression Note    Patient Details  Name: Timothy Nicholson MRN: [id number] Date of Birth: 1950-12-22  Transition of Care [facility]) CM/SW Contact  Roxie KANDICE Stain, RN Phone Number: 03/17/2024, 3:42 PM  Clinical Narrative:    Spoke to patient and wife at bedside regarding home health and DME.  Patient declines home health but is agreeable to walker and BSC. Referral sent to [employer] for DME.   Expected Discharge Plan: Home/Self Care Barriers to Discharge: Continued Medical Work up               Expected Discharge Plan and Services   Discharge Planning Services: CM Consult   Living arrangements for the past 2 months: Single Family Home                                       Social Drivers of Health (SDOH) Interventions SDOH Screenings   Food Insecurity: No Food Insecurity (03/04/2024)  Housing: Low Risk  (03/04/2024)  Transportation Needs: No Transportation Needs (03/04/2024)  Utilities: Not At Risk (03/04/2024)  Financial Resource Strain: Low Risk  (08/04/2023)   Received from [facility]  Social Connections: Socially Integrated (03/10/2024)  Tobacco Use: Low Risk  (03/16/2024)    Readmission Risk Interventions     No data to display

## 2024-03-17 NOTE — Progress Notes (Incomplete)
Patient provided with verbal discharge instructions. Paper copy of discharge provided to patient. RN answered all questions. VSS at discharge. IV removed. Patient belongings sent with patient. Patient dc'd via

## 2024-03-17 NOTE — Progress Notes (Signed)
 PICC removed per protocol per MD order. Manual pressure applied for 5 mins. Vaseline gauze, gauze, and Tegaderm applied over insertion site. No bleeding or swelling noted. Instructed patient to remain in bed for thirty mins. Educated patient about S/S of infection and when to call MD; no heavy lifting or pressure on L side for 24 hours; keep dressing dry and intact for 24 hours. Pt verbalized comprehension.

## 2024-03-17 NOTE — Discharge Summary (Signed)
 Advanced Heart Failure Team  Discharge Summary   Patient ID: Timothy Nicholson MRN: [id number], DOB/AGE: 1950-09-02 73 y.o. Admit date: 03/03/2024 D/C date:     03/17/2024   Primary Discharge Diagnoses:  Cardiogenic Shock Hemorrhagic Shock STEMI Retroperitoneal Bleed Hematuria  Secondary Discharge Diagnoses:  CAD CKD3b VT Ileus  Hospital Course:   Timothy Nicholson is a 73 y.o. male who presented 03/03/24 to [facility] with anterior STEMI in cardiogenic shock. Pressors and inotropes initiated in ED and cath lab. Underwent urgent cath Impella CP was placed for LV decompression and shock, this was complicated by VT/VF on insertion s/p defib, now on amio. Angiogram showed total occlusion of pLAD s/p PCI. Initial echo on impella CP support showed EF 30% with mild reduced RV function. In ICU, developed RPB and hemorrhagic shock due to impella access site seen on CT scan 03/03/24 and worsening cardiogenic shock. He then underwent Impella exchange, CP > 5.5 on 03/08/24. Over the next week he was wean from impella support. Echo showed improvement of LV function EF 30-35%. It was removed on 03/14/24. Repeat echo showed improvement of EF to 45-50%. He was slowly weaned from dobutamine . Course was also significantly complicated by hematuria, he was seen by Urology and underwent CBI with slow resolution of bleeding, he does have a history of urinary retention. Initial foley was removed, but remained with dysuria so coude was placed for home at the recommendation of Urologist.   Hospital Course by Problem List:  1. CAD: Anterior STEMI with occluded proximal LAD; the RCA was not selectively engaged but appeared to be relatively small with proximal occlusion. Now s/p DES to proximal LAD and PTCA to ostial D1.  - continue ASA/statin/Brilinta    2. Cardiogenic shock: Resolved. Due to anterior MI.  Limited echo with Impella CP showed EF 25% with LAD territory akinesis, normal RV size/mildly decreased systolic function. S/p  Impella 5.5 with EF recover and removal 12/1.  - Pocus echo 11/29 EF 35-40% normal RV on Impella P-7 - Pocus echo 11/30 EF 40-45% - Repeat echo 12/2 with recovered EF 45-50%, nl RV.  - euvolemic on exam - continue digoxin 0.0625 mg daily - no indication for further GDMT recovered EF   3. CKD stage 3: AKI revolved. Baseline creatinine 1.6.  - Due to ATN.    4. VT: Peri-STEMI. - VT quiescent  - continue amio 200 mg daily, can hopefully discontinue later   5. Heme: Hemorrhagic component to shock with hematoma at Impella access site and RP hematoma + hematuria.  CTA showed RP hematoma without active extravasation.  - s/p multiple transfusions - resolved    7. Hematuria - long run of CBI for clots - failed void trial - foley at discharge - Urology follow up   10. Retroperitoneal hematoma - stable    Discharge Weight:  Discharge Vitals: Blood pressure (!) 102/54, pulse 85, temperature 98.3 F (36.8 C), temperature source Oral, resp. rate 20, height 5' 10 (1.778 m), weight 78.1 kg, SpO2 95%.  Labs: Lab Results  Component Value Date   WBC 9.4 03/17/2024   HGB 8.2 (L) 03/17/2024   HCT 25.9 (L) 03/17/2024   MCV 93.5 03/17/2024   PLT 384 03/17/2024    Recent Labs  Lab 03/17/24 0425  NA 136  K 4.2  CL 104  CO2 25  BUN 34*  CREATININE 1.47*  CALCIUM  7.9*  GLUCOSE 99   Lab Results  Component Value Date   CHOL 95 03/04/2024   HDL 41  03/04/2024   LDLCALC 45 03/04/2024   TRIG 43 03/04/2024   BNP (last 3 results) No results for input(s): BNP in the last 8760 hours.  ProBNP (last 3 results) No results for input(s): PROBNP in the last 8760 hours.   Diagnostic Studies/Procedures   No results found.  Discharge Medications   Allergies as of 03/17/2024       Reactions   Sulfa Antibiotics Rash        Medication List     STOP taking these medications    bacitracin -polymyxin b  ophthalmic ointment Commonly known as: POLYSPORIN     lisinopril -hydrochlorothiazide  10-12.5 MG tablet Commonly known as: ZESTORETIC        TAKE these medications    acetaminophen  500 MG tablet Commonly known as: TYLENOL  Take 500 mg by mouth every 6 (six) hours as needed for mild pain or moderate pain.   amiodarone  200 MG tablet Commonly known as: PACERONE  Take 1 tablet (200 mg total) by mouth daily. Start taking on: March 18, 2024   aspirin  EC 81 MG tablet Take 1 tablet (81 mg total) by mouth daily. Swallow whole. Start taking on: March 18, 2024   digoxin  0.125 MG tablet Commonly known as: LANOXIN  Take 0.5 tablets (62.5 mcg total) by mouth daily. Start taking on: March 18, 2024   finasteride  5 MG tablet Commonly known as: PROSCAR  Take 5 mg by mouth daily.   multivitamin with minerals tablet Take 1 tablet by mouth daily.   pantoprazole  40 MG tablet Commonly known as: PROTONIX  Take 40 mg by mouth daily.   prednisoLONE  acetate 1 % ophthalmic suspension Commonly known as: PRED FORTE  Place 1 drop into the left eye 4 (four) times daily. What changed:  when to take this reasons to take this   rosuvastatin  20 MG tablet Commonly known as: CRESTOR  Take 1 tablet (20 mg total) by mouth daily. Start taking on: March 18, 2024 What changed:  medication strength how much to take   senna-docusate 8.6-50 MG tablet Commonly known as: Senokot-S Take 1 tablet by mouth daily. Stop taking if develop diarrhea   tamsulosin  0.4 MG Caps capsule Commonly known as: FLOMAX  Take 0.4 mg by mouth daily.   ticagrelor  90 MG Tabs tablet Commonly known as: BRILINTA  Take 1 tablet (90 mg total) by mouth 2 (two) times daily.               Durable Medical Equipment  (From admission, onward)           Start     Ordered   03/17/24 1545  For home use only DME Walker rolling  Once       Question Answer Comment  Walker: With 5 Inch Wheels   Patient needs a walker to treat with the following condition Physical  deconditioning      03/17/24 1545   03/17/24 1545  For home use only DME Bedside commode  Once       Question:  Patient needs a bedside commode to treat with the following condition  Answer:  Physical deconditioning   03/17/24 1545            Disposition   The patient will be discharged in stable condition to home. Discharge Instructions     (HEART FAILURE PATIENTS) Call MD:  Anytime you have any of the following symptoms: 1) 3 pound weight gain in 24 hours or 5 pounds in 1 week 2) shortness of breath, with or without a dry hacking cough 3) swelling in the hands,  feet or stomach 4) if you have to sleep on extra pillows at night in order to breathe.   Complete by: As directed    Amb Referral to Cardiac Rehabilitation   Complete by: As directed    Diagnosis:  STEMI Coronary Stents     After initial evaluation and assessments completed: Virtual Based Care [place] be provided alone or in conjunction with Phase 2 Cardiac Rehab based on patient barriers.: Yes   Intensive Cardiac Rehabilitation (ICR) [facility] location only OR Traditional Cardiac Rehabilitation (TCR) *If criteria for ICR are not met will enroll in TCR [facility] only): Yes   Continue Urinary Catheter   Complete by: As directed    Diet - low sodium heart healthy   Complete by: As directed    Increase activity slowly   Complete by: As directed        Follow-up Information     [name] ORN, MD Follow up.   Specialty: Internal Medicine Why: please call to arrange hospital follow up appt in 7-14 days 03/23/24 @2 :30 PM Contact information: [address] [place] [address] [phone]         [facility] Follow up on 03/28/2024.   Specialty: Cardiology Why: at 10:30 am  Advanced Heart Failure Clinic [facility], Entrance C Free Valet Parking Available Contact information: [address] [place]   [address] [phone]                 Duration of Discharge Encounter: 35 minutes    Patient seen with PA/NP, agree with the above note.    Subjective:   Patient feeling well this morning, BP slowly improving. Did pass trial of void, plan to anchor foley again and follow up with urology as an outpatient.    Exam: General: NAD Lungs: Clear to auscultation bilaterally with normal respiratory effort. CV: Heart regular S1/S2, no murmur.  No peripheral edema. JVP flat Abdomen: Soft, no distention.  Neurologic: awake/alert, no gross FND.      A/P   Patient presented with anterior STEMI --> cardiogenic shock requiring impella CP on arrival with eventual transition to impella 5.5. Successful PCI of the LAD with gradual return of ventricular function. 5.5 was removed on 12/1, patient did well following. EF prior to discharge was 45-50%. Discharged on amiodarone  given VT/VF during admission, can likely be stopped as an outpatient.   Aspirin  and ticagrelor  continued. Foley in place, will return with further hematuria or clots.    I spent 35 minutes in discharge planning.    Morene Brownie Advanced Heart Failure

## 2024-03-17 NOTE — TOC Progression Note (Signed)
 Transition of Care [facility]) - Progression Note    Patient Details  Name: Timothy Nicholson MRN: [id number] Date of Birth: January 20, 1951  Transition of Care [facility]) CM/SW Contact  Arlana JINNY Nicholaus [place] Phone Number: [phone] 03/17/2024, 11:28 AM  Clinical Narrative:   HF CSW spoke with the patients wife. Who stated that the walker in the room is not theirs. Per progressions patient will more than likely need a walker for home. HF CSW notified CM via secure chat.   HF CSW/CM will continue to follow and monitor for dc readiness.     Expected Discharge Plan: Home/Self Care Barriers to Discharge: Continued Medical Work up               Expected Discharge Plan and Services   Discharge Planning Services: CM Consult   Living arrangements for the past 2 months: Single Family Home                                       Social Drivers of Health (SDOH) Interventions SDOH Screenings   Food Insecurity: No Food Insecurity (03/04/2024)  Housing: Low Risk  (03/04/2024)  Transportation Needs: No Transportation Needs (03/04/2024)  Utilities: Not At Risk (03/04/2024)  Financial Resource Strain: Low Risk  (08/04/2023)   Received from [facility]  Social Connections: Socially Integrated (03/10/2024)  Tobacco Use: Low Risk  (03/16/2024)    Readmission Risk Interventions     No data to display

## 2024-03-17 NOTE — Progress Notes (Signed)
 3 Days Post-Op Subjective: Patient doing well on the floor.  Clear yellow urine this morning.  Reviewed case and plan.  Objective: Vital signs in last 24 hours: Temp:  [98.1 F (36.7 C)-98.9 F (37.2 C)] 98.3 F (36.8 C) (12/04 0742) Pulse Rate:  [76-103] 82 (12/04 0742) Resp:  [14-25] 14 (12/04 0742) BP: (91-131)/(8-79) 103/54 (12/04 0742) SpO2:  [92 %-99 %] 93 % (12/04 0418) Weight:  [78.1 kg] 78.1 kg (12/04 0418)  Assessment/Plan: # Gross hematuria-resolved   S/p clot evacuation and fulguration with Dr. Carolee on 03/08/2024.  Continue Flomax  and finasteride   CBI clamped on rounds  Consider prostate artery embolization if ongoing bleeding  3-way hematuria catheter removed this morning-fill and pull, 250 mL.  Bladder scan Q2.  If bladder volume exceeds 500 mL, replace Foley catheter with 66f coud and discharge home.  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  Unclear etiology.     Intake/Output from previous day: 12/03 0701 - 12/04 0700 In: 96.4 [I.V.:96.4] Out: 1600 [Urine:1600]  Intake/Output this shift: Total I/O In: 120 [P.O.:120] Out: -   Physical Exam:  General: Alert and oriented CV: No cyanosis Lungs: equal chest rise Gu: Three-way hematuria catheter removed  Lab Results: Recent Labs    03/15/24 0410 03/16/24 0912 03/17/24 0425  HGB 8.0* 8.9* 8.2*  HCT 24.8* 28.3* 25.9*   BMET Recent Labs    03/16/24 0432 03/16/24 0912 03/17/24 0425  NA 138  --  136  K 4.1  --  4.2  CL 108  --  104  CO2 26  --  25  GLUCOSE 109*  --  99  BUN 36*  --  34*  CREATININE 1.45*  --  1.47*  CALCIUM  7.9*  --  7.9*  HGB  --  8.9* 8.2*  WBC  --  12.9* 9.4     Studies/Results: ECHOCARDIOGRAM LIMITED Result Date: 03/15/2024    ECHOCARDIOGRAM LIMITED REPORT   Patient Name:   Timothy Nicholson Date of Exam: 03/15/2024 Medical Rec #:  [id number]   Height:       70.0 in  Accession #:    [id number]  Weight:       174.2 lb Date of Birth:  [birth year]  BSA:          1.968 m Patient Age:    73 years    BP:           104/59 mmHg Patient Gender: M           HR:           91 bpm. Exam Location:  Inpatient Procedure: Limited Echo, Cardiac Doppler and Intracardiac Opacification Agent            (Both Spectral and Color Flow Doppler were utilized during            procedure). Indications:    Congestive heart failure 150.9  History:        Patient has prior history of Echocardiogram examinations, most                 recent 03/12/2024. Previous Myocardial Infarction and CAD,                 shock; Risk Factors:Hypertension.  Sonographer:    Merlynn Argyle Referring Phys: [id number] JORDAN LEE IMPRESSIONS  1. Left ventricular ejection fraction, by estimation, is 45 to 50%. The left ventricle  has mildly decreased function. The left ventricle demonstrates global hypokinesis. Left ventricular diastolic parameters are indeterminate.  2. Right ventricular systolic function is normal. The right ventricular size is normal.  3. The mitral valve is normal in structure. No evidence of mitral valve regurgitation. No evidence of mitral stenosis.  4. The aortic valve is normal in structure. Aortic valve regurgitation is not visualized. No aortic stenosis is present.  5. The inferior vena cava is normal in size with greater than 50% respiratory variability, suggesting right atrial pressure of 3 mmHg. FINDINGS  Left Ventricle: Left ventricular ejection fraction, by estimation, is 45 to 50%. The left ventricle has mildly decreased function. The left ventricle demonstrates global hypokinesis. Definity contrast agent was given IV to delineate the left ventricular  endocardial borders. The left ventricular internal cavity size was normal in size. There is no left ventricular hypertrophy. Left ventricular diastolic parameters are indeterminate.  LV Wall Scoring: The apical septal segment, apical inferior segment, and  apex are akinetic. The entire anterior wall, entire lateral wall, anterior septum, inferior wall, mid inferoseptal segment, and basal inferoseptal segment are hypokinetic. Right Ventricle: The right ventricular size is normal. No increase in right ventricular wall thickness. Right ventricular systolic function is normal. Left Atrium: Left atrial size was normal in size. Right Atrium: Right atrial size was normal in size. Pericardium: Trivial pericardial effusion is present. The pericardial effusion is circumferential. Mitral Valve: The mitral valve is normal in structure. No evidence of mitral valve stenosis. Tricuspid Valve: The tricuspid valve is normal in structure. Tricuspid valve regurgitation is not demonstrated. No evidence of tricuspid stenosis. Aortic Valve: The aortic valve is normal in structure. Aortic valve regurgitation is not visualized. No aortic stenosis is present. Pulmonic Valve: The pulmonic valve was normal in structure. Pulmonic valve regurgitation is not visualized. No evidence of pulmonic stenosis. Aorta: The aortic root is normal in size and structure. Venous: The inferior vena cava is normal in size with greater than 50% respiratory variability, suggesting right atrial pressure of 3 mmHg. IAS/Shunts: No atrial level shunt detected by color flow Doppler. Additional Comments: Spectral Doppler performed.  LEFT VENTRICLE PLAX 2D LVIDd:         5.20 cm      Diastology LVIDs:         3.40 cm      LV e' medial:    9.48 cm/s LV PW:         1.00 cm      LV E/e' medial:  11.1 LV IVS:        1.00 cm      LV e' lateral:   12.40 cm/s                             LV E/e' lateral: 8.5  LV Volumes (MOD) LV vol d, MOD A2C: 154.0 ml LV vol d, MOD A4C: 202.0 ml LV vol s, MOD A2C: 89.2 ml LV vol s, MOD A4C: 91.1 ml LV SV MOD A2C:     64.8 ml LV SV MOD A4C:     202.0 ml LV SV MOD BP:      87.4 ml RIGHT VENTRICLE RV S prime:     20.20 cm/s TAPSE (M-mode): 2.0 cm MITRAL VALVE MV Area (PHT): 4.54 cm MV Decel Time:  167 msec MV E velocity: 105.00 cm/s MV A velocity: 83.60 cm/s MV E/A ratio:  1.26 Kardie Tobb DO Electronically signed by Kardie  Tobb DO Signature Date/Time: 03/15/2024/12:55:15 PM    Final       LOS: 14 days   Ole Bourdon, NP [facility] Pager: [phone]  03/17/2024, 9:42 AM

## 2024-03-17 NOTE — Anesthesia Postprocedure Evaluation (Signed)
 Anesthesia Post Note  Patient: Timothy Nicholson  Procedure(s) Performed: REMOVAL, CARDIAC ASSIST DEVICE, IMPELLA 5.5     Patient location during evaluation: SICU Anesthesia Type: General Level of consciousness: awake and alert Pain management: pain level controlled Vital Signs Assessment: post-procedure vital signs reviewed and stable Respiratory status: spontaneous breathing and respiratory function stable Cardiovascular status: stable Postop Assessment: no apparent nausea or vomiting Anesthetic complications: no   No notable events documented.  Last Vitals:  Vitals:   03/17/24 0418 03/17/24 0742  BP: 108/79 (!) 103/54  Pulse: 97 82  Resp: 20 14  Temp: 36.9 C 36.8 C  SpO2: 93% 92%    Last Pain:  Vitals:   03/17/24 0742  TempSrc: Oral  PainSc:                  Heavenleigh Petruzzi S

## 2024-03-17 NOTE — Progress Notes (Signed)
 CARDIAC REHAB PHASE I     Post MI/stent education including restrictions, risk factors, exercise guidelines, antiplatelet therapy importance, MI booklet, NTG use, heart healthy diet and CRP2 reviewed. All questions and concerns addressed. Will refer to [facility] for CRP2. Plan for discharge home later today.    Vaughn Asberry Hacking, RN BSN 03/17/2024 12:01 PM

## 2024-03-17 NOTE — Progress Notes (Signed)
 Advanced Heart Failure Rounding Note Cardiologist: None  Chief Complaint: Hematuria Patient Profile:    73 y/o male admitted w/ acute anterior STEMI c/b CS requiring Impella CP support, further c/b + RPB/hemorrhagic shock. Given ongoing support needs and inability to wean off chemical inotrope/pressor support, was upgraded to Impella 5.5 on 11/25.   Subjective:    11/25: s/p Impella 5.5; Removed 12/1. Echo 12/4 EF recovered 45-50%.  Co-ox 72% off DBA. BP improved.  Net negative 1.5L. sCr stable 1.47  Feeling well today. No SOB and CP.   Objective:    Weight Range: 78.1 kg Body mass index is 24.71 kg/m.   Vital Signs:   Temp:  [98.1 F (36.7 C)-98.9 F (37.2 C)] 98.5 F (36.9 C) (12/04 0418) Pulse Rate:  [76-108] 97 (12/04 0418) Resp:  [11-32] 20 (12/04 0418) BP: (91-131)/(8-79) 108/79 (12/04 0418) SpO2:  [79 %-100 %] 93 % (12/04 0418) Weight:  [78.1 kg] 78.1 kg (12/04 0418) Last BM Date : 03/16/24  Weight change: Filed Weights   03/15/24 0500 03/16/24 0500 03/17/24 0418  Weight: 79 kg 79.6 kg 78.1 kg   Intake/Output:  Intake/Output Summary (Last 24 hours) at 03/17/2024 0739 Last data filed at 03/17/2024 0421 Gross per 24 hour  Intake 96.38 ml  Output 1600 ml  Net -1503.62 ml    Physical Exam   General: Well appearing. No distress  Cardiac: JVP flat. No murmurs  Extremities: Warm and dry.  No edema.  Neuro: A&O x3. Affect pleasant.   Telemetry   SR 70s (personally reviewed)  Labs    CBC Recent Labs    03/16/24 0912 03/17/24 0425  WBC 12.9* 9.4  HGB 8.9* 8.2*  HCT 28.3* 25.9*  MCV 95.0 93.5  PLT 401* 384   Basic Metabolic Panel Recent Labs    87/96/74 0432 03/17/24 0425  NA 138 136  K 4.1 4.2  CL 108 104  CO2 26 25  GLUCOSE 109* 99  BUN 36* 34*  CREATININE 1.45* 1.47*  CALCIUM  7.9* 7.9*  MG 2.1 2.0   Medications:    Scheduled Medications:  amiodarone   200 mg Oral Daily   aspirin  EC  81 mg Oral Daily   Chlorhexidine   Gluconate Cloth  6 each Topical Daily   digoxin   0.0625 mg Oral Daily   feeding supplement  237 mL Oral TID BM   finasteride   5 mg Oral Daily   pantoprazole   40 mg Oral Daily   polyethylene glycol  17 g Oral Daily   rosuvastatin   20 mg Oral Daily   senna-docusate  2 tablet Oral QHS   tamsulosin   0.4 mg Oral Daily   ticagrelor   90 mg Oral BID   traZODone   50 mg Oral QHS    Infusions:  sodium chloride  Stopped (03/16/24 1557)   sodium chloride  irrigation      PRN Medications: sodium chloride , acetaminophen , melatonin, ondansetron  (ZOFRAN ) IV, mouth rinse, oxyCODONE , simethicone , sodium chloride  flush  Assessment/Plan   1. CAD: Anterior STEMI with occluded proximal LAD; the RCA was not selectively engaged but appeared to be relatively small with proximal occlusion. Now s/p DES to proximal LAD and PTCA to ostial D1.  - no angina - continue ASA/statin/Brilinta   2. Cardiogenic shock: Resolved. Due to anterior MI.  Limited echo with Impella CP showed EF 25% with LAD territory akinesis, normal RV size/mildly decreased systolic function. S/p Impella 5.5 with EF recover and removal 12/1. Repeat echo with recovered EF 45-50%, nl RV.  - Pocus  echo 11/29 EF 35-40% normal RV on Impella P-7 - Pocus echo 11/30 EF 40-45% - Co-ox 72% off DBA - euvolemic on exam - continue digoxin 0.0625 mg daily - no indication for further GDMT recovered EF  3. CKD stage 3: AKI revolved. Baseline creatinine 1.6. Peaked at 2.3. Now at baseline 1.4 - Due to ATN.   4. VT: Peri-STEMI. - VT quiescent  - continue amio 200 mg daily  5. Heme: Hemorrhagic component to shock with hematoma at Impella access site and RP hematoma + hematuria.  CTA showed RP hematoma without active extravasation.  - hgb stable 8.5>8.0>8.9>8.2 Transfuse Hgb < 8.   6. Pulmonary: Possible aspiration PNA.  - completed 5 day course of Unasyn   7. Hematuria - Urology managing s/p fulguration of bladder.  - CBI complete - dc foley this am;  void trial  8. Thrombocytopenia - resolved  9. Illeus: - resolved  10. Retroperitoneal hematoma - stable   Ok to discharge pending void trial.   Length of Stay: 66  Sita Mangen, NP  03/17/2024, 7:39 AM  Advanced Heart Failure Team Pager [phone] (M-F; 7a - 5p)  Please contact [facility] for night-coverage after hours (5p -7a ) and weekends on [email or web address]

## 2024-03-23 ENCOUNTER — Other Ambulatory Visit
Admission: RE | Admit: 2024-03-23 | Discharge: 2024-03-23 | Disposition: A | Discharge status: Home / Self Care | Source: Ambulatory Visit | Attending: Internal Medicine | Admitting: Internal Medicine

## 2024-03-23 DIAGNOSIS — I25119 Atherosclerotic heart disease of native coronary artery with unspecified angina pectoris: Secondary | ICD-10-CM | POA: Insufficient documentation

## 2024-03-23 LAB — DIGOXIN LEVEL: Digoxin Level: 1.1 ng/mL (ref 0.8–2.0)

## 2024-03-25 ENCOUNTER — Telehealth (HOSPITAL_COMMUNITY): Payer: Self-pay

## 2024-03-25 NOTE — Telephone Encounter (Signed)
 Called to confirm/remind patient of their appointment at the [facility] on 03/25/24.   Appointment:   [] Confirmed  [x] Left mess   [] No answer/No voice mail  [] VM Full/unable to leave message  [] Phone not in service  Patient reminded to bring all medications and/or complete list.  Confirmed patient has transportation. Gave directions, instructed to utilize valet parking.

## 2024-03-28 ENCOUNTER — Other Ambulatory Visit: Payer: Self-pay | Admitting: Physician Assistant

## 2024-03-28 ENCOUNTER — Encounter: Payer: Self-pay | Admitting: Physician Assistant

## 2024-03-28 ENCOUNTER — Ambulatory Visit (HOSPITAL_COMMUNITY): Admit: 2024-03-28 | Discharge: 2024-03-28 | Attending: Cardiology

## 2024-03-28 ENCOUNTER — Encounter (HOSPITAL_COMMUNITY): Payer: Self-pay

## 2024-03-28 VITALS — BP 120/60 | HR 74 | Ht 70.0 in | Wt 161.6 lb

## 2024-03-28 DIAGNOSIS — I5020 Unspecified systolic (congestive) heart failure: Secondary | ICD-10-CM | POA: Diagnosis not present

## 2024-03-28 DIAGNOSIS — I2510 Atherosclerotic heart disease of native coronary artery without angina pectoris: Secondary | ICD-10-CM | POA: Diagnosis not present

## 2024-03-28 DIAGNOSIS — Z79899 Other long term (current) drug therapy: Secondary | ICD-10-CM | POA: Diagnosis not present

## 2024-03-28 DIAGNOSIS — I4729 Other ventricular tachycardia: Secondary | ICD-10-CM | POA: Diagnosis not present

## 2024-03-28 DIAGNOSIS — Z7902 Long term (current) use of antithrombotics/antiplatelets: Secondary | ICD-10-CM | POA: Diagnosis not present

## 2024-03-28 DIAGNOSIS — Z955 Presence of coronary angioplasty implant and graft: Secondary | ICD-10-CM | POA: Diagnosis not present

## 2024-03-28 DIAGNOSIS — Z9861 Coronary angioplasty status: Secondary | ICD-10-CM | POA: Diagnosis not present

## 2024-03-28 DIAGNOSIS — M96841 Postprocedural hematoma of a musculoskeletal structure following other procedure: Secondary | ICD-10-CM | POA: Diagnosis not present

## 2024-03-28 DIAGNOSIS — R319 Hematuria, unspecified: Secondary | ICD-10-CM | POA: Diagnosis not present

## 2024-03-28 DIAGNOSIS — I5022 Chronic systolic (congestive) heart failure: Secondary | ICD-10-CM | POA: Diagnosis present

## 2024-03-28 DIAGNOSIS — N1832 Chronic kidney disease, stage 3b: Secondary | ICD-10-CM | POA: Diagnosis not present

## 2024-03-28 DIAGNOSIS — Z48812 Encounter for surgical aftercare following surgery on the circulatory system: Secondary | ICD-10-CM | POA: Diagnosis not present

## 2024-03-28 DIAGNOSIS — Z95811 Presence of heart assist device: Secondary | ICD-10-CM | POA: Diagnosis not present

## 2024-03-28 DIAGNOSIS — I2109 ST elevation (STEMI) myocardial infarction involving other coronary artery of anterior wall: Secondary | ICD-10-CM | POA: Diagnosis not present

## 2024-03-28 NOTE — Progress Notes (Signed)
 Advanced Heart Failure Clinic Note   Referring Physician: PCP: [name] ORN, MD PCP-Cardiologist: Dr. Rolan   Chief Complaint: Hospital F/u for Systolic Heart Failure  HPI:  73 y/o male admitted 11/25 w/ acute anterior STEMI (occluded pLAD treated w/ PCI + DES) c/b CS, initially requiring Impella CP support w/ hospital course further c/b hemorrhagic shock 2/2 RPB and hematuria as well as nonoliguric AKI. Given ongoing support needs and inability to wean off chemical inotrope/pressor support, he was upgraded to Impella 5.5 for bridge to recovery vs bridge to durable LVAD. EF luckily recovered, from 25% up to 45-50%. He was taken back to OR for extraction of Impella 5.5. Discharged on 12/4.   He presents today for post hospital f/u. Here w/ his wife. Doing well. Denies CP. No resting dyspnea. No significant dyspnea w/ basic ALDs, though still not doing much. No LEE. Reports full med compliance. Tolerating well. BP today 120/60 but has been much lower at home in the 90s-low 100s systolic. No orthostatic symptoms.   Of note, he had f/u labs done on 03/23/24 at PCP office. Personally reviewed, SCr 1.4, K 4.9, Hgb 9.8 (improved from 8.2).  His only concern is his surgical wound from his Impella 5.5. Still w/ some mild purulent drainage. His PCP started him on Doxycyline last wk and he has not finished course yet. There is no surrounding skin erythremia and he denies fever and chills.      Cardiac Studies/ Procedures   Emergent LHC w/ Placement on Impella CP SMRT Assist 03/03/24 Left Anterior Descending  Prox LAD to Mid LAD lesion is 100% stenosed.    First Diagonal Branch  1st Diag lesion is 90% stenosed.    Right Coronary Artery  Ost RCA to Prox RCA lesion is 100% stenosed.     Hemodynamic Support DEVICE IMPELLA CP SMRT ASSIST was inserted. Access site:  right femoral artery.   ____________________________________________________  RHC 03/03/24:  Right     RA Mean  mmHg  11    RA A-Wave  mmHg 12    RA V-Wave  mmHg 12  Pulmonary     PA  mmHg 43/24 (35)    PCW Mean  mmHg 24.0    PCW A-Wave  mmHg 24.0    PCW V-Wave  mmHg 27.0    PAPi   1.7    Saturations Phases Resting    PA  % 48    Arterial  % 88  _____________________________________  - Impella 5.5 Implant 03/08/24 - Pocus echo 11/29 EF 35-40% normal RV on Impella P-7 - Pocus echo 11/30 EF 40-45% - Impella 5.5 Removal 03/14/24  - Repeat echo 12/2 with recovered EF 45-50%, nl RV.    Review of Systems: [y] = yes, [ ]  = no   General: Weight gain [ ] ; Weight loss [ ] ; Anorexia [ ] ; Fatigue [ ] ; Fever [ ] ; Chills [ ] ; Weakness [ ]   Cardiac: Chest pain/pressure [ ] ; Resting SOB [ ] ; Exertional SOB [ ] ; Orthopnea [ ] ; Pedal Edema [ ] ; Palpitations [ ] ; Syncope [ ] ; Presyncope [ ] ; Paroxysmal nocturnal dyspnea[ ]   Pulmonary: Cough [ ] ; Wheezing[ ] ; Hemoptysis[ ] ; Sputum [ ] ; Snoring [ ]   GI: Vomiting[ ] ; Dysphagia[ ] ; Melena[ ] ; Hematochezia [ ] ; Heartburn[ ] ; Abdominal pain [ ] ; Constipation [ ] ; Diarrhea [ ] ; BRBPR [ ]   GU: Hematuria[ ] ; Dysuria [ ] ; Nocturia[ ]   Vascular: Pain in legs with walking [ ] ; Pain in feet with lying  flat [ ] ; Non-healing sores [ ] ; Stroke [ ] ; TIA [ ] ; Slurred speech [ ] ;  Neuro: Headaches[ ] ; Vertigo[ ] ; Seizures[ ] ; Paresthesias[ ] ;Blurred vision [ ] ; Diplopia [ ] ; Vision changes [ ]   Ortho/Skin: Arthritis [ ] ; Joint pain [ ] ; Muscle pain [ ] ; Joint swelling [ ] ; Back Pain [ ] ; Rash [ ]   Psych: Depression[ ] ; Anxiety[ ]   Heme: Bleeding problems [ ] ; Clotting disorders [ ] ; Anemia [ ]   Endocrine: Diabetes [ ] ; Thyroid dysfunction[ ]    Past Medical History:  Diagnosis Date   Arthritis    osteo   Back pain    Benign prostatic hyperplasia with urinary obstruction and other lower urinary tract symptoms    Cancer (HCC)    maligant polyp   Dyslipidemia    Dysplastic nevus 08/13/2006   L chest pectoral - mod, excision 06.10.2008   Dysplastic nevus 09/29/2006   L groin  - mild   Elevated PSA    Enlarged prostate    Family history of breast cancer    Family history of colon cancer    Family history of lung cancer    Family history of pancreatic cancer    Family history of prostate cancer    GERD (gastroesophageal reflux disease)    Headache    migraine without aura   HOH (hard of hearing)    Hypercholesterolemia    Hypertension    Personal history of colon cancer 01/19/2018   Scoliosis     Current Outpatient Medications  Medication Sig Dispense Refill   acetaminophen  (TYLENOL ) 500 MG tablet Take 500 mg by mouth every 6 (six) hours as needed for mild pain or moderate pain.     amiodarone  (PACERONE ) 200 MG tablet Take 1 tablet (200 mg total) by mouth daily. 30 tablet 6   aspirin  EC 81 MG tablet Take 1 tablet (81 mg total) by mouth daily. Swallow whole. 30 tablet 12   digoxin  (LANOXIN ) 0.125 MG tablet Take 0.5 tablets (62.5 mcg total) by mouth daily. 15 tablet 6   doxycycline (VIBRAMYCIN) 100 MG capsule Take 100 mg by mouth 2 (two) times daily. for 7 days     finasteride  (PROSCAR ) 5 MG tablet Take 5 mg by mouth daily.     furosemide  (LASIX ) 20 MG tablet Take 20 mg by mouth.     levothyroxine (SYNTHROID) 50 MCG tablet Take 50 mcg by mouth.     Multiple Vitamins-Minerals (MULTIVITAMIN WITH MINERALS) tablet Take 1 tablet by mouth daily.     ondansetron  (ZOFRAN ) 4 MG tablet Take 4 mg by mouth.     pantoprazole  (PROTONIX ) 40 MG tablet Take 40 mg by mouth daily.     potassium chloride  (KLOR-CON ) 10 MEQ tablet Take 10 mEq by mouth.     rosuvastatin  (CRESTOR ) 20 MG tablet Take 1 tablet (20 mg total) by mouth daily. 30 tablet 6   senna-docusate (SENOKOT-S) 8.6-50 MG tablet Take 1 tablet by mouth daily. Stop taking if develop diarrhea 30 tablet 6   tamsulosin  (FLOMAX ) 0.4 MG CAPS capsule Take 0.4 mg by mouth daily.      ticagrelor  (BRILINTA ) 90 MG TABS tablet Take 1 tablet (90 mg total) by mouth 2 (two) times daily. 60 tablet 6   No current  facility-administered medications for this encounter.    Allergies[1]    Social History   Socioeconomic History   Marital status: Married    Spouse name: Not on file   Number of children: Not on file  Years of education: Not on file   Highest education level: Not on file  Occupational History   Not on file  Tobacco Use   Smoking status: Never   Smokeless tobacco: Never  Vaping Use   Vaping status: Never Used  Substance and Sexual Activity   Alcohol use: Not Currently   Drug use: No   Sexual activity: Not on file  Other Topics Concern   Not on file  Social History Narrative   Not on file   Social Drivers of Health   Tobacco Use: Low Risk (03/28/2024)   Patient History    Smoking Tobacco Use: Never    Smokeless Tobacco Use: Never    Passive Exposure: Not on file  Financial Resource Strain: Low Risk  (08/04/2023)   Received from [facility]   Overall Financial Resource Strain (CARDIA)    Difficulty of Paying Living Expenses: Not hard at all  Food Insecurity: No Food Insecurity (03/04/2024)   Epic    Worried About Radiation Protection Practitioner of Food in the Last Year: Never true    Ran Out of Food in the Last Year: Never true  Transportation Needs: No Transportation Needs (03/04/2024)   Epic    Lack of Transportation (Medical): No    Lack of Transportation (Non-Medical): No  Physical Activity: Not on file  Stress: Not on file  Social Connections: Socially Integrated (03/10/2024)   Social Connection and Isolation Panel    Frequency of Communication with Friends and Family: More than three times a week    Frequency of Social Gatherings with Friends and Family: More than three times a week    Attends Religious Services: More than 4 times per year    Active Member of [employer] or Organizations: Yes    Attends Banker Meetings: More than 4 times per year    Marital Status: Married  Catering Manager Violence: Not At Risk (03/04/2024)   Epic    Fear of  Current or Ex-Partner: No    Emotionally Abused: No    Physically Abused: No    Sexually Abused: No  Depression (PHQ2-9): Not on file  Alcohol Screen: Not on file  Housing: Unknown (03/23/2024)   Received from [facility]   Epic    In the last 12 months, was there a time when you were not able to pay the mortgage or rent on time?: No    Number of Times Moved in the Last Year: Not on file    At any time in the past 12 months, were you homeless or living in a shelter (including now)?: No  Utilities: Not At Risk (03/04/2024)   Epic    Threatened with loss of utilities: No  Health Literacy: Not on file      Family History  Problem Relation Age of Onset   Breast cancer Mother 45   Lung cancer Father 9       hx of smoking   Colon polyps Father        'had 9 polyps on his last colonocospy'   Colon cancer Maternal Uncle 75   Lung cancer Paternal Aunt 50       hx smoking   Pancreatic cancer Paternal Uncle        hx of alcohol abuse   Heart disease Maternal Grandfather    Aortic aneurysm Maternal Grandfather    Kidney failure Paternal Grandmother    Prostate cancer Maternal Uncle  metastatic, died of this cancer in 70's/80's   Cirrhosis Paternal Uncle        hx of alcohol abuse   Breast cancer Cousin        77's   Breast cancer Cousin        50's   Breast cancer Cousin        50's    Vitals:   03/28/24 1029  BP: 120/60  Pulse: 74  SpO2: 98%  Weight: 73.3 kg (161 lb 9.6 oz)  Height: 5' 10 (1.778 m)     PHYSICAL EXAM: General:  Well appearing. No respiratory difficulty HEENT: normal Neck: supple. no JVD. Carotids 2+ bilat; no bruits. No lymphadenopathy or thyromegaly appreciated. Cor: PMI nondisplaced. Regular rate & rhythm. No rubs, gallops or murmurs. Lungs: clear Abdomen: soft, nontender, nondistended. No hepatosplenomegaly. No bruits or masses. Good bowel sounds. Extremities: no cyanosis, clubbing, rash, edema Neuro: alert & oriented x  3, cranial nerves grossly intact. moves all 4 extremities w/o difficulty. Affect pleasant.  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 Euvolemic  - Continue Lasix  20 mg daily  - W/ improving EF and soft BP (90s systolic at home) will not add ARB just yet - continue digoxin  0.0625 mg for now. Can likely stop at next visit if still doing well symptomatically   2. CAD - s/p anterior STEMI 11/25 with occluded proximal LAD; the RCA was not selectively engaged but appeared to be relatively small with proximal occlusion. Now s/p DES to proximal LAD and PTCA to ostial D1 - CP free - Continue ASA/Brilinta  - Continue statin   3. VT: peri-STEMI - EF improved post revascularization, EF up to 45-50% - on indication for ICD  - continue PO amio until f/u w/ Dr. Rolan. Can likely stop therafter   4. CKD IIIb - SCr 1.7 at hospital d/c - improved at hospital f/u w/ PCP, SCr down to 1.4  5. Retroperitoneal Hematoma  - noted post cardiac catheterization  - Hgb improving   6. Hematuria - observed during hospitalization, required foley w/ CIB per urology  - pt denies any further gross hematuria, Hgb improving   - he will need to maintain DAPT for new LAD stent - Urology follow up arranged, still w/ foley    7. Surgical Wound - Impella 5.5 siyr, now on Doxycyline started by PCP  - I have reached out to our CT surgical team to come by to see pt and guide further management  - he will need continued daily packing of wound and clinic f/u in their office next wk, appreciated their assistance   F/u w/ Dr. Rolan in our [place] office in 4 wks.   Caffie Shed, PA-C 03/28/2024       [1]  Allergies Allergen Reactions   Sulfa Antibiotics Rash

## 2024-03-28 NOTE — Patient Instructions (Signed)
 Medication Changes:  No Changes In Medications at this time.   Lab Work:  None Ordered At This Time.   Follow-Up in: 1 month as scheduled with Dr. Rolan at [place] office.   At the [facility], you and your health needs are our priority. We have a designated team specialized in the treatment of Heart Failure. This Care Team includes your primary Heart Failure Specialized Cardiologist (physician), Advanced Practice Providers (APPs- Physician Assistants and Nurse Practitioners), and Pharmacist who all work together to provide you with the care you need, when you need it.   You M[place]affrey see any of the following providers on your designated Care Team at your next follow up:  Dr. [name] Dr. Ezra Rolan Dr. Odis Brownie Greig Mosses, NP Caffie Shed, [place] [facility] [place], [place] Beckey Coe, NP Jordan Lee, NP Tinnie Redman, PharmD   Please be sure to bring in all your medications bottles to every appointment.   Need to Contact Us :  If you have any questions or concerns before your next appointment please send us  a message through [place] or call our office at [phone].    TO LEAVE A MESSAGE FOR THE NURSE SELECT OPTION 2, PLEASE LEAVE A MESSAGE INCLUDING: YOUR NAME DATE OF BIRTH CALL BACK NUMBER REASON FOR CALL**this is important as we prioritize the call backs  YOU WILL RECEIVE A CALL BACK THE SAME DAY AS LONG AS YOU CALL BEFORE 4:00 PM

## 2024-03-28 NOTE — Progress Notes (Signed)
° °   °  [address] 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 Daniel on 12/1.  He presented to the AHF clinic today for follow up.  They contacted our office in regards to concern of infection from one of the Impella surgical sites.  He was started on Doxycycline by his Primary care physician for this last week.  He denies fevers.  He is accompanied by his wife who stated she was not given instructions on wound care.   Exam:  axillary cutdown is well healed w/o evidence of infection   Exit site: small 1.5 cm opening, packed with gauze..  This tracks 5-6 cm. This has been in place for 48 hours.  There is no surrounding erythema or evidence of acute infection.  Plan:  Continue Doxycycline  Wound Care: soak packing gauze in Vashe.. pack would daily and cover with clean dressing  Follow up in our office Monday 12/22 with Manuelita for wound check.  Rocky Shad, PA-C 12:27 PM 03/28/2024

## 2024-03-28 NOTE — Patient Instructions (Signed)
 Please pack wound daily with iodophorm gauze.  Please soak gauze in Vashe prior to packing   Complete prescribed antibiotics from primary care physician.  Follow up in our office Monday 12/22

## 2024-03-31 ENCOUNTER — Telehealth (HOSPITAL_COMMUNITY): Payer: Self-pay

## 2024-03-31 NOTE — Telephone Encounter (Signed)
 Paper work faxed to [employer] [facility] at [phone]. Patient called and informed paper work has been faxed

## 2024-04-04 ENCOUNTER — Ambulatory Visit

## 2024-04-04 ENCOUNTER — Encounter: Payer: Self-pay | Admitting: *Deleted

## 2024-04-04 ENCOUNTER — Other Ambulatory Visit (HOSPITAL_COMMUNITY): Payer: Self-pay

## 2024-04-04 VITALS — BP 122/69 | HR 82 | Resp 18 | Ht 70.0 in | Wt 161.0 lb

## 2024-04-04 DIAGNOSIS — Z5189 Encounter for other specified aftercare: Secondary | ICD-10-CM

## 2024-04-04 NOTE — Patient Instructions (Signed)
-  Follow up in one week for wound check -Please change packing and dressing daily

## 2024-04-04 NOTE — Progress Notes (Signed)
 "     [address] Zone [place] [address]             [phone]       HPI:  Timothy Nicholson is a 33 old man who presents for wound care follow up.  He underwent an Impella removal on 03/14/2024 with Dr. Daniel.  He was seen in heart failure clinic where it was noted that his incision had purulent drainage. He was evaluated by surgical PA, Erin Barrett and was to pack the wound with iodophorm gauze that was soaked with Vashe. He was prescribed doxycyline by his PCP and he completed all as prescribed  He presents to the clinic today with his wife and reports that he is doing well.  He did have some nausea and vomited this morning which has resolved.  He is still having drainage from the wound site but it is not purulent. He has been having the dressing changed and packed by home health once a week.  He has not been changing daily. He denies fever, chills, and surrounding erythema.    Allergies as of 04/04/2024       Reactions   Sulfa Antibiotics Rash        Medication List        Accurate as of April 04, 2024 12:28 PM. If you have any questions, ask your nurse or doctor.          acetaminophen  500 MG tablet Commonly known as: TYLENOL  Take 500 mg by mouth every 6 (six) hours as needed for mild pain or moderate pain.   amiodarone  200 MG tablet Commonly known as: PACERONE  Take 1 tablet (200 mg total) by mouth daily.   Aspirin  Adult Low Strength 81 MG tablet Generic drug: aspirin  EC Take 1 tablet (81 mg total) by mouth daily. Swallow whole.   digoxin  0.125 MG tablet Commonly known as: LANOXIN  Take 0.5 tablets (62.5 mcg total) by mouth daily.   finasteride  5 MG tablet Commonly known as: PROSCAR  Take 5 mg by mouth daily.   furosemide  20 MG tablet Commonly known as: LASIX  Take 20 mg by mouth.   levothyroxine 50 MCG tablet Commonly known as: SYNTHROID Take 50 mcg by mouth.   multivitamin with minerals tablet Take 1 tablet by mouth daily.    pantoprazole  40 MG tablet Commonly known as: PROTONIX  Take 40 mg by mouth daily.   potassium chloride  10 MEQ tablet Commonly known as: KLOR-CON  Take 10 mEq by mouth.   rosuvastatin  20 MG tablet Commonly known as: CRESTOR  Take 1 tablet (20 mg total) by mouth daily.   Senna-S 8.6-50 MG tablet Generic drug: senna-docusate Take 1 tablet by mouth daily. Stop taking if develop diarrhea   tamsulosin  0.4 MG Caps capsule Commonly known as: FLOMAX  Take 0.4 mg by mouth daily.   ticagrelor  90 MG Tabs tablet Commonly known as: BRILINTA  Take 1 tablet (90 mg total) by mouth 2 (two) times daily.         ROS: Review of Systems  Constitutional:  Negative for chills and fever.  Respiratory:  Negative for cough and shortness of breath.   Cardiovascular:  Negative for chest pain and leg swelling.     BP 122/69 (BP Location: Right Arm)   Pulse 82   Resp 18   Ht 5' 10 (1.778 m)   Wt 161 lb (73 kg)   SpO2 99%   BMI 23.10 kg/m   Physical Exam Skin:  A/P:  Visit for wound check -Wound was repacked with iodoform and dressing changed in office  -Discussed with wife and patient that dressing and packing should be changed daily.  Education provided to wife on packing instructions -Will attempt to reach out to home health to have orders updated for dressing/packing to occur more frequently -Alarm symptoms discussed and patient understands to when to contact our clinic or when to present to the emergency department -Follow up in one week for wound check  [name] Rough, PA-C 12:28 PM 04/04/2024    "

## 2024-04-11 ENCOUNTER — Ambulatory Visit

## 2024-04-11 VITALS — BP 118/69 | HR 81 | Resp 20 | Ht 70.0 in | Wt 164.0 lb

## 2024-04-11 DIAGNOSIS — Z09 Encounter for follow-up examination after completed treatment for conditions other than malignant neoplasm: Secondary | ICD-10-CM

## 2024-04-11 NOTE — Patient Instructions (Signed)
Follow up in 2 weeks for wound check

## 2024-04-11 NOTE — Progress Notes (Signed)
 "     [address] Zone [place] [address]             [phone]       HPI:  Timothy Nicholson is a 27 old man who presents for wound care follow up.  He underwent an Impella removal on 03/14/2024 with Dr. Daniel.  He was seen in heart failure clinic where it was noted that his incision had purulent drainage. He was evaluated by surgical PA, Erin Barrett and was to pack the wound with iodophorm gauze that was soaked with Vashe. He was prescribed doxycyline by his PCP and he completed all as prescribed. He was seen in clinic on 12/222025 and wound was evaluated. He was to start daily dressing/packing changes.   He presents to the clinic today with his wife.  They have been changing the packing and dressing daily.  He denies fever, chills, and surrounding erythema. Home health did not show up last week for a dressing change.   Allergies as of 04/11/2024       Reactions   Sulfa Antibiotics Rash        Medication List        Accurate as of April 11, 2024 11:50 AM. If you have any questions, ask your nurse or doctor.          acetaminophen  500 MG tablet Commonly known as: TYLENOL  Take 500 mg by mouth every 6 (six) hours as needed for mild pain or moderate pain.   amiodarone  200 MG tablet Commonly known as: PACERONE  Take 1 tablet (200 mg total) by mouth daily.   aspirin  EC 81 MG tablet Take 1 tablet (81 mg total) by mouth daily. Swallow whole.   digoxin  0.125 MG tablet Commonly known as: LANOXIN  Take 0.5 tablets (62.5 mcg total) by mouth daily.   finasteride  5 MG tablet Commonly known as: PROSCAR  Take 5 mg by mouth daily.   furosemide  20 MG tablet Commonly known as: LASIX  Take 20 mg by mouth.   levothyroxine 50 MCG tablet Commonly known as: SYNTHROID Take 50 mcg by mouth.   multivitamin with minerals tablet Take 1 tablet by mouth daily.   pantoprazole  40 MG tablet Commonly known as: PROTONIX  Take 40 mg by mouth daily.   potassium chloride  10 MEQ  tablet Commonly known as: KLOR-CON  Take 10 mEq by mouth.   rosuvastatin  20 MG tablet Commonly known as: CRESTOR  Take 1 tablet (20 mg total) by mouth daily.   senna-docusate 8.6-50 MG tablet Commonly known as: Senokot-S Take 1 tablet by mouth daily. Stop taking if develop diarrhea   tamsulosin  0.4 MG Caps capsule Commonly known as: FLOMAX  Take 0.4 mg by mouth daily.   ticagrelor  90 MG Tabs tablet Commonly known as: BRILINTA  Take 1 tablet (90 mg total) by mouth 2 (two) times daily.         ROS: Review of Systems  Constitutional:  Negative for chills and fever.  Respiratory:  Negative for cough and shortness of breath.   Cardiovascular:  Negative for chest pain and leg swelling.     BP 118/69   Pulse 81   Resp 20   Ht 5' 10 (1.778 m)   Wt 164 lb (74.4 kg)   SpO2 98% Comment: RA  BMI 23.53 kg/m   Physical Exam Skin:         A/P:  Visit for wound check -Wound was repacked with iodoform and dressing changed in office  -Continue with  daily dressing and packing changes, his wife feels comfortable doing dressing changes at home -Home health agency is [facility]-phone number [phone], will attempt to reach out and see if orders can be updated for more frequent packing/dressing changes -Alarm symptoms discussed and patient understands to when to contact our clinic or when to present to the emergency department -Follow up in two weeks for wound check  [name] Rough, PA-C 11:50 AM 04/11/2024   [facility] [phone]   "

## 2024-04-12 ENCOUNTER — Ambulatory Visit

## 2024-04-20 ENCOUNTER — Telehealth: Payer: Self-pay | Admitting: Physician Assistant

## 2024-04-20 NOTE — Telephone Encounter (Signed)
 Patient's wife called the office after hours. I was paged as the PA on call. I spoke with patient's wife and she has been changing the dressing (from Impella removal) as instructed. She became concerned because during the dressing change today, there was more bleeding on Iodoform gauze and her husband did not want her to pack it too deep because it hurt. Per wife, patient not active bleeding and wound does not appear to be infected. I instructed them if bleeding continues or worsens, he should go to ED to be evaluated. Otherwise, I will leave a message with the office to contact patient in the am and arrange to be seen.

## 2024-04-21 ENCOUNTER — Ambulatory Visit

## 2024-04-21 VITALS — BP 120/68 | HR 76 | Resp 20 | Ht 70.0 in | Wt 163.0 lb

## 2024-04-21 DIAGNOSIS — Z09 Encounter for follow-up examination after completed treatment for conditions other than malignant neoplasm: Secondary | ICD-10-CM

## 2024-04-21 DIAGNOSIS — Z5189 Encounter for other specified aftercare: Secondary | ICD-10-CM

## 2024-04-21 NOTE — Progress Notes (Signed)
 "     [address] Zone [place] [address]             [phone]       HPI:  Timothy Nicholson is a 61 old man who presents for wound care follow up.  He underwent an Impella removal on 03/14/2024 with Dr. Daniel.  He was seen in heart failure clinic where it was noted that his incision had purulent drainage. He was evaluated by surgical PA, Erin Barrett and was to pack the wound with iodophorm gauze that was soaked with Vashe. He was prescribed doxycyline by his PCP and he completed all as prescribed. He was seen in clinic on 04/04/2024 and on 04/11/2024 and wound was evaluated. He was to start daily dressing/packing changes.   He presents today with his wife for continued follow up for a wound. His wife had been changing the packing yesterday evening when he started to complain of more pain. She also noticed more blood on the packing when it was removed.  She then stopped and contacted our office. The wound has not had continuous bleeding. No erythema around wound. He denies fever, chills and purulent drainage.   Allergies as of 04/21/2024       Reactions   Sulfa Antibiotics Rash        Medication List        Accurate as of April 21, 2024 11:59 PM. If you have any questions, ask your nurse or doctor.          acetaminophen  500 MG tablet Commonly known as: TYLENOL  Take 500 mg by mouth every 6 (six) hours as needed for mild pain or moderate pain.   amiodarone  200 MG tablet Commonly known as: PACERONE  Take 1 tablet (200 mg total) by mouth daily.   aspirin  EC 81 MG tablet Take 1 tablet (81 mg total) by mouth daily. Swallow whole.   digoxin  0.125 MG tablet Commonly known as: LANOXIN  Take 0.5 tablets (62.5 mcg total) by mouth daily.   finasteride  5 MG tablet Commonly known as: PROSCAR  Take 5 mg by mouth daily.   furosemide  20 MG tablet Commonly known as: LASIX  Take 20 mg by mouth.   levothyroxine 50 MCG tablet Commonly known as: SYNTHROID Take 50 mcg by  mouth.   multivitamin with minerals tablet Take 1 tablet by mouth daily.   pantoprazole  40 MG tablet Commonly known as: PROTONIX  Take 40 mg by mouth daily.   potassium chloride  10 MEQ tablet Commonly known as: KLOR-CON  Take 10 mEq by mouth.   rosuvastatin  20 MG tablet Commonly known as: CRESTOR  Take 1 tablet (20 mg total) by mouth daily.   senna-docusate 8.6-50 MG tablet Commonly known as: Senokot-S Take 1 tablet by mouth daily. Stop taking if develop diarrhea   tamsulosin  0.4 MG Caps capsule Commonly known as: FLOMAX  Take 0.4 mg by mouth daily.   ticagrelor  90 MG Tabs tablet Commonly known as: BRILINTA  Take 1 tablet (90 mg total) by mouth 2 (two) times daily.         ROS: Review of Systems  Constitutional:  Negative for chills and fever.  Respiratory:  Negative for cough and shortness of breath.   Cardiovascular:  Negative for chest pain and leg swelling.     BP 120/68   Pulse 76   Resp 20   Ht 5' 10 (1.778 m)   Wt 163 lb (73.9 kg)   SpO2 98% Comment: RA  BMI 23.39 kg/m  Physical Exam Skin:         A/P:  Visit for wound check -Wound was repacked with guaze soaked in [place] and dressing changed in office  -Wound depth has decreased since last in office visit.  Continue with daily dressing and packing changes -reached out to home health to try and update orders for wound care since his wife is now uncomfortable with packing the wound due to him experiencing pain and increased bleeding -Alarm symptoms discussed and patient understands to when to contact our clinic or when to present to the emergency department -Follow up in around one week for wound check  [name] Rough, PA-C 3:35 PM 04/22/2024      "

## 2024-04-22 NOTE — Patient Instructions (Signed)
 Follow up in one week

## 2024-04-25 ENCOUNTER — Ambulatory Visit

## 2024-04-26 ENCOUNTER — Ambulatory Visit: Payer: Self-pay

## 2024-04-26 VITALS — BP 113/64 | HR 72 | Resp 20 | Ht 70.0 in | Wt 161.8 lb

## 2024-04-26 DIAGNOSIS — Z5189 Encounter for other specified aftercare: Secondary | ICD-10-CM | POA: Insufficient documentation

## 2024-04-26 NOTE — Patient Instructions (Signed)
-  Please contact our office if you need further assistance with your wound

## 2024-04-26 NOTE — Progress Notes (Signed)
 "     [address] Zone [place] [address]             [phone]       HPI:  Timothy Nicholson is a 43 old man who presents for wound care follow up.  He underwent an Impella removal on 03/14/2024 with Dr. Daniel.  He was seen in heart failure clinic where it was noted that his incision had purulent drainage. He was evaluated by surgical PA, Erin Barrett and was to pack the wound with iodophorm gauze that was soaked with Vashe. He was prescribed doxycyline by his PCP and he completed all as prescribed. He was seen in clinic on 04/04/2024,04/11/2024,04/21/2024 and wound was evaluated. He was to continue with daily dressing/packing changes.   He presents today with his wife for continued follow up for a wound. He states he is no longer able to pack the wound due to depth.  He has been keeping the area clean and dry. No erythema around wound. He denies fever, chills and purulent drainage.   Allergies as of 04/26/2024       Reactions   Sulfa Antibiotics Rash        Medication List        Accurate as of April 26, 2024  1:59 PM. If you have any questions, ask your nurse or doctor.          acetaminophen  500 MG tablet Commonly known as: TYLENOL  Take 500 mg by mouth every 6 (six) hours as needed for mild pain or moderate pain.   amiodarone  200 MG tablet Commonly known as: PACERONE  Take 1 tablet (200 mg total) by mouth daily.   aspirin  EC 81 MG tablet Take 1 tablet (81 mg total) by mouth daily. Swallow whole.   digoxin  0.125 MG tablet Commonly known as: LANOXIN  Take 0.5 tablets (62.5 mcg total) by mouth daily.   finasteride  5 MG tablet Commonly known as: PROSCAR  Take 5 mg by mouth daily.   furosemide  20 MG tablet Commonly known as: LASIX  Take 20 mg by mouth.   levothyroxine 50 MCG tablet Commonly known as: SYNTHROID Take 50 mcg by mouth.   multivitamin with minerals tablet Take 1 tablet by mouth daily.   pantoprazole  40 MG tablet Commonly known as:  PROTONIX  Take 40 mg by mouth daily.   potassium chloride  10 MEQ tablet Commonly known as: KLOR-CON  Take 10 mEq by mouth.   rosuvastatin  20 MG tablet Commonly known as: CRESTOR  Take 1 tablet (20 mg total) by mouth daily.   senna-docusate 8.6-50 MG tablet Commonly known as: Senokot-S Take 1 tablet by mouth daily. Stop taking if develop diarrhea   tamsulosin  0.4 MG Caps capsule Commonly known as: FLOMAX  Take 0.4 mg by mouth daily.   ticagrelor  90 MG Tabs tablet Commonly known as: BRILINTA  Take 1 tablet (90 mg total) by mouth 2 (two) times daily.         ROS: Review of Systems  Constitutional:  Negative for chills and fever.  Respiratory:  Negative for cough and shortness of breath.   Cardiovascular:  Negative for chest pain and leg swelling.     BP 113/64 (BP Location: Right Arm, Patient Position: Sitting, Cuff Size: Normal)   Pulse 72   Resp 20   Ht 5' 10 (1.778 m)   Wt 161 lb 12.8 oz (73.4 kg)   SpO2 98% Comment: RA  BMI 23.22 kg/m   Physical Exam Skin:  A/P:  Visit for wound check -Wound depth at 0.5 cm and is unable to be packed due to depth -Continue to keep area clean and dry -Alarm symptoms discussed and patient understands to when to contact our clinic or when to present to the emergency department -Follow up with [facility] as needed  [name] Rough, PA-C 1:59 PM 04/26/2024      "

## 2024-04-27 ENCOUNTER — Telehealth: Payer: Self-pay | Admitting: Cardiology

## 2024-04-27 NOTE — Telephone Encounter (Signed)
 Called to confirm/remind patient of their appointment at the [facility] on 04/28/24.   Appointment:   [x] Confirmed  [] Left mess   [] No answer/No voice mail  [] VM Full/unable to leave message  [] Phone not in service  Patient reminded to bring all medications and/or complete list.  Confirmed patient has transportation. Gave directions, instructed to utilize valet parking.

## 2024-04-28 ENCOUNTER — Telehealth (HOSPITAL_COMMUNITY): Payer: Self-pay

## 2024-04-28 ENCOUNTER — Other Ambulatory Visit (HOSPITAL_COMMUNITY): Payer: Self-pay

## 2024-04-28 ENCOUNTER — Ambulatory Visit: Attending: Cardiology | Admitting: Cardiology

## 2024-04-28 VITALS — BP 109/60 | HR 66 | Wt 159.6 lb

## 2024-04-28 DIAGNOSIS — I252 Old myocardial infarction: Secondary | ICD-10-CM | POA: Insufficient documentation

## 2024-04-28 DIAGNOSIS — I2102 ST elevation (STEMI) myocardial infarction involving left anterior descending coronary artery: Secondary | ICD-10-CM

## 2024-04-28 DIAGNOSIS — Z8679 Personal history of other diseases of the circulatory system: Secondary | ICD-10-CM | POA: Insufficient documentation

## 2024-04-28 DIAGNOSIS — I255 Ischemic cardiomyopathy: Secondary | ICD-10-CM | POA: Diagnosis not present

## 2024-04-28 DIAGNOSIS — I5022 Chronic systolic (congestive) heart failure: Secondary | ICD-10-CM | POA: Insufficient documentation

## 2024-04-28 DIAGNOSIS — I2510 Atherosclerotic heart disease of native coronary artery without angina pectoris: Secondary | ICD-10-CM | POA: Insufficient documentation

## 2024-04-28 DIAGNOSIS — Z7982 Long term (current) use of aspirin: Secondary | ICD-10-CM | POA: Insufficient documentation

## 2024-04-28 DIAGNOSIS — N183 Chronic kidney disease, stage 3 unspecified: Secondary | ICD-10-CM | POA: Insufficient documentation

## 2024-04-28 DIAGNOSIS — Z9861 Coronary angioplasty status: Secondary | ICD-10-CM

## 2024-04-28 DIAGNOSIS — I5020 Unspecified systolic (congestive) heart failure: Secondary | ICD-10-CM

## 2024-04-28 DIAGNOSIS — Z7902 Long term (current) use of antithrombotics/antiplatelets: Secondary | ICD-10-CM | POA: Diagnosis not present

## 2024-04-28 DIAGNOSIS — Z7984 Long term (current) use of oral hypoglycemic drugs: Secondary | ICD-10-CM | POA: Insufficient documentation

## 2024-04-28 DIAGNOSIS — Z79899 Other long term (current) drug therapy: Secondary | ICD-10-CM | POA: Insufficient documentation

## 2024-04-28 DIAGNOSIS — Z955 Presence of coronary angioplasty implant and graft: Secondary | ICD-10-CM | POA: Diagnosis not present

## 2024-04-28 MED ORDER — DAPAGLIFLOZIN PROPANEDIOL 10 MG PO TABS: 10.0000 mg | ORAL_TABLET | Freq: Every day | ORAL | 5 refills | Status: AC

## 2024-04-28 MED ORDER — SPIRONOLACTONE 25 MG PO TABS: 12.5000 mg | ORAL_TABLET | Freq: Every day | ORAL | 1 refills | Status: AC

## 2024-04-28 NOTE — Patient Instructions (Signed)
 Medication Changes:  STOP Potassium, Furosemide , Amiodarone , and Digoxin   START Farxiga  10mg  daily before breakfast  START Spironolactone  12.5mg  (1/2 tab) daily  Lab Work:   Go downstairs to [facility] on [address] to have your blood work completed.  We will only call you if the results are abnormal or if the provider would like to make medication changes.  No news is good news.     Testing/Procedures:  Your physician has requested that you have an echocardiogram. Echocardiography is a painless test that uses sound waves to create images of your heart. It provides your doctor with information about the size and shape of your heart and how well your hearts chambers and valves are working. This procedure takes approximately one hour. There are no restrictions for this procedure. Please do NOT wear cologne, perfume, aftershave, or lotions (deodorant is allowed). Please arrive 15 minutes prior to your appointment time.  Please note: We ask at that you not bring children with you during ultrasound (echo/ vascular) testing. Due to room size and safety concerns, children are not allowed in the ultrasound rooms during exams. Our front office staff cannot provide observation of children in our lobby area while testing is being conducted. An adult accompanying a patient to their appointment will only be allowed in the ultrasound room at the discretion of the ultrasound technician under special circumstances. We apologize for any inconvenience.  Someone will be in contact with you in order to schedule an appointment.   Follow-Up in: Please follow up with the [facility] in 3 months with Dr. Mclean.   Thank you for choosing [facility].    At the [facility], you and your health needs are our priority. We have a designated team specialized in the treatment of Heart Failure. This Care Team includes your primary Heart  Failure Specialized Cardiologist (physician), Advanced Practice Providers (APPs- Physician Assistants and Nurse Practitioners), and Pharmacist who all work together to provide you with the care you need, when you need it.   You Rome see any of the following providers on your designated Care Team at your next follow up:  Dr. [name] Dr. Ezra Shuck Dr. Ria Commander Dr. Morene Brownie Ellouise Class, FNP Jaun Bash, RPH-CPP  Please be sure to bring in all your medications bottles to every appointment.   Need to Contact Us :  If you have any questions or concerns before your next appointment please send us  a message through [place] or call our office at [phone].    TO LEAVE A MESSAGE FOR THE NURSE SELECT OPTION 2, PLEASE LEAVE A MESSAGE INCLUDING: YOUR NAME DATE OF BIRTH CALL BACK NUMBER REASON FOR CALL**this is important as we prioritize the call backs  YOU WILL RECEIVE A CALL BACK THE SAME DAY AS LONG AS YOU CALL BEFORE 4:00 PM

## 2024-04-28 NOTE — Telephone Encounter (Signed)
 Advanced [facility] Failure Patient Advocate Encounter  Test billing for this patient's current coverage ([employer]) returns a $47.90 copay for 30 day supply of Dapagliflozin .  This test claim was processed through [facility]- copay amounts [place] vary at other pharmacies due to pharmacy/plan contracts, or as the patient moves through the different stages of their insurance plan.  Rachel DEL, CPhT Rx Patient Advocate Phone: [phone]

## 2024-04-28 NOTE — Progress Notes (Signed)
 "  Advanced Heart Failure Clinic Note   PCP: [name] ORN, MD PCP-Cardiologist: Dr. Rolan   Chief Complaint: CHF  HPI:  74 y.o. male admitted 11/25 with acute anterior STEMI (occluded pLAD treated w/ PCI + DES) complicated by cardiogenic shock, initially requiring Impella CP support with hospital course further complicated by hemorrhagic shock due to retroperitoneal hematoma.  He had VT peri-MI and was started on amiodarone .  He additionally had hematuria as well as nonoliguric AKI. Given ongoing support needs and inability to wean off  Impella CP, he was upgraded to Impella 5.5 for bridge to recovery vs bridge to durable LVAD. EF luckily recovered, from 25% up to 45-50%. He was taken back to OR for extraction of Impella 5.5. Discharged on 03/17/24.   He developed a surgical site infection at the Impella 5.5 site that has been treated.  He has finished antibiotics.   He returns for followup of CHF.  He seems to be doing well.  He is ready to go back to work at [employer].  He denies exertional dyspnea or chest pain.  Has not started cardiac rehab yet but has no problems walking around house or stores.  No orthopnea/PND.  No palpitations or lightheadedness.   ECG (personally reviewed): NSR, RBBB, LAFB  Labs (12/25): digoxin  1.1, K 4.9, creatinine 1.4 Labs (1/26): TSH normal.  PMH: 1. CKD stage 3 2. Hypothyroidism 3. Hyperlipidemia 4. CAD: Anterior STEMI 11/25.  Cath showed occluded proximal LAD, 90% stenosis D1, occluded RCA. He had DES to LAD and PTCA to D1.   5. Chronic systolic CHF: Cardiogenic shock at time of anterior STEMI requiring Impella.   - Echo (11/25): EF 30%, mild RV dysfunction.  - Echo (12/25): EF 45-50%, normal RV.  6. VT: Peri-MI in 11/25.    Review of Systems: All systems reviewed and negative except as per HPI.    Current Outpatient Medications  Medication Sig Dispense Refill   acetaminophen  (TYLENOL ) 500 MG tablet Take 500 mg by mouth every 6  (six) hours as needed for mild pain or moderate pain.     aspirin  EC 81 MG tablet Take 1 tablet (81 mg total) by mouth daily. Swallow whole. 30 tablet 12   dapagliflozin  propanediol (FARXIGA ) 10 MG TABS tablet Take 1 tablet (10 mg total) by mouth daily before breakfast. 30 tablet 5   finasteride  (PROSCAR ) 5 MG tablet Take 5 mg by mouth daily.     levothyroxine (SYNTHROID) 50 MCG tablet Take 50 mcg by mouth.     Multiple Vitamins-Minerals (MULTIVITAMIN WITH MINERALS) tablet Take 1 tablet by mouth daily.     pantoprazole  (PROTONIX ) 40 MG tablet Take 40 mg by mouth daily.     rosuvastatin  (CRESTOR ) 20 MG tablet Take 1 tablet (20 mg total) by mouth daily. 30 tablet 6   senna-docusate (SENOKOT-S) 8.6-50 MG tablet Take 1 tablet by mouth daily. Stop taking if develop diarrhea 30 tablet 6   spironolactone  (ALDACTONE ) 25 MG tablet Take 0.5 tablets (12.5 mg total) by mouth daily. 45 tablet 1   tamsulosin  (FLOMAX ) 0.4 MG CAPS capsule Take 0.4 mg by mouth daily.      ticagrelor  (BRILINTA ) 90 MG TABS tablet Take 1 tablet (90 mg total) by mouth 2 (two) times daily. 60 tablet 6   No current facility-administered medications for this visit.    Allergies[1]    Social History   Socioeconomic History   Marital status: Married    Spouse name: Not on file  Number of children: Not on file   Years of education: Not on file   Highest education level: Not on file  Occupational History   Not on file  Tobacco Use   Smoking status: Never   Smokeless tobacco: Never  Vaping Use   Vaping status: Never Used  Substance and Sexual Activity   Alcohol use: Not Currently   Drug use: No   Sexual activity: Not on file  Other Topics Concern   Not on file  Social History Narrative   Not on file   Social Drivers of Health   Tobacco Use: Low Risk (04/26/2024)   Patient History    Smoking Tobacco Use: Never    Smokeless Tobacco Use: Never    Passive Exposure: Not on file  Financial Resource Strain: Low Risk   (08/04/2023)   Received from [facility]   Overall Financial Resource Strain (CARDIA)    Difficulty of Paying Living Expenses: Not hard at all  Food Insecurity: No Food Insecurity (03/04/2024)   Epic    Worried About Radiation Protection Practitioner of Food in the Last Year: Never true    Ran Out of Food in the Last Year: Never true  Transportation Needs: No Transportation Needs (03/04/2024)   Epic    Lack of Transportation (Medical): No    Lack of Transportation (Non-Medical): No  Physical Activity: Not on file  Stress: Not on file  Social Connections: Socially Integrated (03/10/2024)   Social Connection and Isolation Panel    Frequency of Communication with Friends and Family: More than three times a week    Frequency of Social Gatherings with Friends and Family: More than three times a week    Attends Religious Services: More than 4 times per year    Active Member of [employer] or Organizations: Yes    Attends Banker Meetings: More than 4 times per year    Marital Status: Married  Catering Manager Violence: Not At Risk (03/04/2024)   Epic    Fear of Current or Ex-Partner: No    Emotionally Abused: No    Physically Abused: No    Sexually Abused: No  Depression (PHQ2-9): Not on file  Alcohol Screen: Not on file  Housing: Unknown (04/25/2024)   Received from [facility]   Epic    In the last 12 months, was there a time when you were not able to pay the mortgage or rent on time?: No    Number of Times Moved in the Last Year: Not on file    At any time in the past 12 months, were you homeless or living in a shelter (including now)?: No  Utilities: Not At Risk (03/04/2024)   Epic    Threatened with loss of utilities: No  Health Literacy: Not on file      Family History  Problem Relation Age of Onset   Breast cancer Mother 25   Lung cancer Father 80       hx of smoking   Colon polyps Father        'had 9 polyps on his last colonocospy'   Colon  cancer Maternal Uncle 75   Lung cancer Paternal Aunt 43       hx smoking   Pancreatic cancer Paternal Uncle        hx of alcohol abuse   Heart disease Maternal Grandfather    Aortic aneurysm Maternal Grandfather    Kidney failure Paternal Grandmother    Prostate cancer  Maternal Uncle        metastatic, died of this cancer in 70's/80's   Cirrhosis Paternal Uncle        hx of alcohol abuse   Breast cancer Cousin        47's   Breast cancer Cousin        68's   Breast cancer Cousin        9's    Vitals:   04/28/24 1343  BP: 109/60  Pulse: 66  SpO2: 98%  Weight: 159 lb 9.6 oz (72.4 kg)    PHYSICAL EXAM: General: NAD Neck: No JVD, no thyromegaly or thyroid nodule.  Lungs: Clear to auscultation bilaterally with normal respiratory effort. CV: Nondisplaced PMI.  Heart regular S1/S2, no S3/S4, no murmur.  No peripheral edema.  No carotid bruit.  Normal pedal pulses.  Abdomen: Soft, nontender, no hepatosplenomegaly, no distention.  Skin: Intact without lesions or rashes.  Neurologic: Alert and oriented x 3.  Psych: Normal affect. Extremities: No clubbing or cyanosis.  HEENT: Normal.   ASSESSMENT & PLAN:  1. Chronic HF with mid-range EF: Ischemic cardiomyopathy. S/p anterior STEMI with LAD PCI, EF initially 25%, recovered w/ Impella support + IV inotropes. Repeat echo 03/14/24 showed EF 45-50%, RV normal size/systolic function. NYHA class I-II, not volume overloaded on exam.  - I would like him to stop Lasix  and KCl, start Farxiga  10 mg daily.  - Stop digoxin . - Start spironolactone  12.5 daily. BMET/BNP today, BMET in 10 days.  - Repeat echo, if EF remains low, can add low dose beta blocker + ARNI vs ARB depending on BP.  2. CAD: S/p anterior STEMI 11/25.  Cath showed occluded proximal LAD, 90% stenosis D1, occluded RCA. He had DES to LAD and PTCA to D1.  No chest pain.  - Refer for cardiac rehab at [facility]  - Continue ASA/Brilinta  - Continue rosuvastatin  20 mg daily, check  lipids today.  3. VT: peri-STEMI in 11/25. EF improved post revascularization, up to 45-50% in 12/25.  - Stop amiodarone  today.  - Repeat echo, suspect EF will remain out of ICD range.   4. CKD stage 3:  - BMET today. - Adding Farxiga .  5. Surgical wound infection at Impella 5.5 site: This appears to have resolved.   Followup in 3 months.   I spent 31 minutes reviewing records, interviewing/examining patient, and managing orders.   Ezra Shuck, 04/28/2024       [1]  Allergies Allergen Reactions   Sulfa Antibiotics Rash   "

## 2024-04-29 LAB — LIPID PANEL
Chol/HDL Ratio: 2.3 ratio (ref 0.0–5.0)
Cholesterol, Total: 144 mg/dL (ref 100–199)
HDL: 62 mg/dL
LDL Chol Calc (NIH): 68 mg/dL (ref 0–99)
Triglycerides: 71 mg/dL (ref 0–149)
VLDL Cholesterol Cal: 14 mg/dL (ref 5–40)

## 2024-04-29 LAB — BASIC METABOLIC PANEL WITH GFR
BUN/Creatinine Ratio: 17 (ref 10–24)
BUN: 27 mg/dL (ref 8–27)
CO2: 24 mmol/L (ref 20–29)
Calcium: 9.3 mg/dL (ref 8.6–10.2)
Chloride: 101 mmol/L (ref 96–106)
Creatinine, Ser: 1.58 mg/dL — ABNORMAL HIGH (ref 0.76–1.27)
Glucose: 80 mg/dL (ref 70–99)
Potassium: 3.8 mmol/L (ref 3.5–5.2)
Sodium: 142 mmol/L (ref 134–144)
eGFR: 46 mL/min/1.73 — ABNORMAL LOW

## 2024-04-29 LAB — BRAIN NATRIURETIC PEPTIDE: BNP: 104.7 pg/mL — ABNORMAL HIGH (ref 0.0–100.0)

## 2024-05-01 ENCOUNTER — Ambulatory Visit (HOSPITAL_COMMUNITY): Payer: Self-pay | Admitting: Cardiology

## 2024-05-01 DIAGNOSIS — I5020 Unspecified systolic (congestive) heart failure: Secondary | ICD-10-CM

## 2024-05-04 MED ORDER — ROSUVASTATIN CALCIUM 40 MG PO TABS: 40.0000 mg | ORAL_TABLET | Freq: Every day | ORAL | 1 refills | Status: AC

## 2024-05-04 NOTE — Telephone Encounter (Unsigned)
 Spoke with pt about medication. Pt agreeable to medication changes and labs in 2 months

## 2024-05-11 ENCOUNTER — Encounter: Attending: Cardiology | Admitting: *Deleted

## 2024-05-11 VITALS — Ht 68.5 in | Wt 160.0 lb

## 2024-05-11 DIAGNOSIS — I213 ST elevation (STEMI) myocardial infarction of unspecified site: Secondary | ICD-10-CM

## 2024-05-11 DIAGNOSIS — Z955 Presence of coronary angioplasty implant and graft: Secondary | ICD-10-CM | POA: Insufficient documentation

## 2024-05-11 DIAGNOSIS — I2102 ST elevation (STEMI) myocardial infarction involving left anterior descending coronary artery: Secondary | ICD-10-CM | POA: Insufficient documentation

## 2024-05-11 NOTE — Progress Notes (Signed)
 Cardiac Individual Treatment Plan  Patient Details  Name: Timothy Nicholson Christina MRN: [id number] Date of Birth: Oct 17, 1950 Referring Provider:   Flowsheet Row Cardiac Rehab from 05/11/2024 in [facility] Cardiac and Pulmonary Rehab  Referring Provider Rolan    Initial Encounter Date:  Flowsheet Row Cardiac Rehab from 05/11/2024 in [facility] Cardiac and Pulmonary Rehab  Date 05/11/24    Visit Diagnosis: ST elevation myocardial infarction (STEMI), unspecified artery (HCC)  Patient's Home Medications on Admission: Current Medications[1]  Past Medical History: Past Medical History:  Diagnosis Date   Arthritis    osteo   Back pain    Benign prostatic hyperplasia with urinary obstruction and other lower urinary tract symptoms    Cancer (HCC)    maligant polyp   Dyslipidemia    Dysplastic nevus 08/13/2006   L chest pectoral - mod, excision 06.10.2008   Dysplastic nevus 09/29/2006   L groin - mild   Elevated PSA    Enlarged prostate    Family history of breast cancer    Family history of colon cancer    Family history of lung cancer    Family history of pancreatic cancer    Family history of prostate cancer    GERD (gastroesophageal reflux disease)    Headache    migraine without aura   HOH (hard of hearing)    Hypercholesterolemia    Hypertension    Personal history of colon cancer 01/19/2018   Scoliosis     Tobacco Use: Tobacco Use History[2]  Labs: Review Flowsheet  More data exists      Latest Ref Rng & Units 03/14/2024 03/15/2024 03/16/2024 03/17/2024 04/28/2024  Labs for ITP Cardiac and Pulmonary Rehab  Cholestrol 100 - 199 mg/dL - - - - 855   LDL (calc) 0 - 99 mg/dL - - - - 68   HDL-C >60 mg/dL - - - - 62   Trlycerides 0 - 149 mg/dL - - - - 71   O2 Saturation % 76.3  74.1  74.6  72.2  -     Exercise Target Goals: Exercise Program Goal: Individual exercise prescription set using results from initial 6 min walk test and THRR while considering  patients activity barriers and  safety.   Exercise Prescription Goal: Initial exercise prescription builds to 30-45 minutes a day of aerobic activity, 2-3 days per week.  Home exercise guidelines will be given to patient during program as part of exercise prescription that the participant will acknowledge.   Education: Aerobic Exercise: - Group verbal and visual presentation on the components of exercise prescription. Introduces F.I.T.T principle from ACSM for exercise prescriptions.  Reviews F.I.T.T. principles of aerobic exercise including progression. Written material provided at class time.   Education: Resistance Exercise: - Group verbal and visual presentation on the components of exercise prescription. Introduces F.I.T.T principle from ACSM for exercise prescriptions  Reviews F.I.T.T. principles of resistance exercise including progression. Written material provided at class time.    Education: Exercise & Equipment Safety: - Individual verbal instruction and demonstration of equipment use and safety with use of the equipment. Flowsheet Row Cardiac Rehab from 05/11/2024 in [facility] Cardiac and Pulmonary Rehab  Date 05/11/24  Educator [facility]  Instruction Review Code 1- Verbalizes Understanding    Education: Exercise Physiology & General Exercise Guidelines: - Group verbal and written instruction with models to review the exercise physiology of the cardiovascular system and associated critical values. Provides general exercise guidelines with specific guidelines to those with heart or lung disease. Written material provided  at class time.   Education: Flexibility, Balance, Mind/Body Relaxation: - Group verbal and visual presentation with interactive activity on the components of exercise prescription. Introduces F.I.T.T principle from ACSM for exercise prescriptions. Reviews F.I.T.T. principles of flexibility and balance exercise training including progression. Also discusses the mind body connection.  Reviews various  relaxation techniques to help reduce and manage stress (i.e. Deep breathing, progressive muscle relaxation, and visualization). Balance handout provided to take home. Written material provided at class time.   Activity Barriers & Risk Stratification:  Activity Barriers & Cardiac Risk Stratification - 05/11/24 1135       Activity Barriers & Cardiac Risk Stratification   Activity Barriers None    Cardiac Risk Stratification High          6 Minute Walk:  6 Minute Walk     Row Name 05/11/24 1134         6 Minute Walk   Phase Initial     Distance 1180 feet     Walk Time 6 minutes     # of Rest Breaks 0     MPH 2.23     METS 2.8     RPE 11     Perceived Dyspnea  0     VO2 Peak 9.8     Symptoms No     Resting HR 67 bpm     Resting BP 104/70     Resting Oxygen Saturation  98 %     Exercise Oxygen Saturation  during 6 min walk 98 %     Max Ex. HR 100 bpm     Max Ex. BP 136/80     2 Minute Post BP 118/72        Oxygen Initial Assessment:   Oxygen Re-Evaluation:   Oxygen Discharge (Final Oxygen Re-Evaluation):   Initial Exercise Prescription:  Initial Exercise Prescription - 05/11/24 1100       Date of Initial Exercise RX and Referring Provider   Date 05/11/24    Referring Provider Rolan      Oxygen   Maintain Oxygen Saturation 88% or higher      Treadmill   MPH 2.2    Grade 1    Minutes 15    METs 2.99      Recumbant Bike   Level 2    RPM 50    Minutes 15    METs 2.8      NuStep   Level 2    SPM 80    Minutes 15    METs 2.8      REL-XR   Level 2    Speed 50    Minutes 15    METs 2.8      Biostep-RELP   Level 2    SPM 50    Minutes 15    METs 2.8      Prescription Details   Frequency (times per week) 3    Duration Progress to 30 minutes of continuous aerobic without signs/symptoms of physical distress      Intensity   THRR 40-80% of Max Heartrate 99-131    Ratings of Perceived Exertion 11-13    Perceived Dyspnea 0-4       Progression   Progression Continue to progress workloads to maintain intensity without signs/symptoms of physical distress.      Resistance Training   Training Prescription Yes    Weight 4    Reps 10-15          Perform Capillary  Blood Glucose checks as needed.  Exercise Prescription Changes:   Exercise Prescription Changes     Row Name 05/11/24 1100             Response to Exercise   Blood Pressure (Admit) 104/70       Blood Pressure (Exercise) 136/80       Blood Pressure (Exit) 118/72       Heart Rate (Admit) 67 bpm       Heart Rate (Exercise) 100 bpm       Heart Rate (Exit) 77 bpm       Oxygen Saturation (Admit) 98 %       Oxygen Saturation (Exercise) 98 %       Oxygen Saturation (Exit) 98 %       Rating of Perceived Exertion (Exercise) 11       Perceived Dyspnea (Exercise) 0       Symptoms none       Comments 6 MWT results          Exercise Comments:   Exercise Goals and Review:   Exercise Goals     Row Name 05/11/24 1138             Exercise Goals   Increase Physical Activity Yes       Intervention Provide advice, education, support and counseling about physical activity/exercise needs.;Develop an individualized exercise prescription for aerobic and resistive training based on initial evaluation findings, risk stratification, comorbidities and participant's personal goals.       Expected Outcomes Short Term: Attend rehab on a regular basis to increase amount of physical activity.;Long Term: Add in home exercise to make exercise part of routine and to increase amount of physical activity.;Long Term: Exercising regularly at least 3-5 days a week.       Increase Strength and Stamina Yes       Intervention Provide advice, education, support and counseling about physical activity/exercise needs.;Develop an individualized exercise prescription for aerobic and resistive training based on initial evaluation findings, risk stratification, comorbidities and  participant's personal goals.       Expected Outcomes Short Term: Increase workloads from initial exercise prescription for resistance, speed, and METs.;Short Term: Perform resistance training exercises routinely during rehab and add in resistance training at home;Long Term: Improve cardiorespiratory fitness, muscular endurance and strength as measured by increased METs and functional capacity ( )       Able to understand and use rate of perceived exertion (RPE) scale Yes       Intervention Provide education and explanation on how to use RPE scale       Expected Outcomes Short Term: Able to use RPE daily in rehab to express subjective intensity level;Long Term:  Able to use RPE to guide intensity level when exercising independently       Able to understand and use Dyspnea scale Yes       Intervention Provide education and explanation on how to use Dyspnea scale       Expected Outcomes Short Term: Able to use Dyspnea scale daily in rehab to express subjective sense of shortness of breath during exertion;Long Term: Able to use Dyspnea scale to guide intensity level when exercising independently       Knowledge and understanding of Target Heart Rate Range (THRR) Yes       Intervention Provide education and explanation of THRR including how the numbers were predicted and where they are located for reference       Expected Outcomes  Short Term: Able to state/look up THRR;Long Term: Able to use THRR to govern intensity when exercising independently;Short Term: Able to use daily as guideline for intensity in rehab       Able to check pulse independently Yes       Intervention Provide education and demonstration on how to check pulse in carotid and radial arteries.;Review the importance of being able to check your own pulse for safety during independent exercise       Expected Outcomes Short Term: Able to explain why pulse checking is important during independent exercise;Long Term: Able to check pulse  independently and accurately       Understanding of Exercise Prescription Yes       Intervention Provide education, explanation, and written materials on patient's individual exercise prescription       Expected Outcomes Short Term: Able to explain program exercise prescription;Long Term: Able to explain home exercise prescription to exercise independently          Exercise Goals Re-Evaluation :   Discharge Exercise Prescription (Final Exercise Prescription Changes):  Exercise Prescription Changes - 05/11/24 1100       Response to Exercise   Blood Pressure (Admit) 104/70    Blood Pressure (Exercise) 136/80    Blood Pressure (Exit) 118/72    Heart Rate (Admit) 67 bpm    Heart Rate (Exercise) 100 bpm    Heart Rate (Exit) 77 bpm    Oxygen Saturation (Admit) 98 %    Oxygen Saturation (Exercise) 98 %    Oxygen Saturation (Exit) 98 %    Rating of Perceived Exertion (Exercise) 11    Perceived Dyspnea (Exercise) 0    Symptoms none    Comments 6 MWT results          Nutrition:  Target Goals: Understanding of nutrition guidelines, daily intake of sodium 1500mg , cholesterol 200mg , calories 30% from fat and 7% or less from saturated fats, daily to have 5 or more servings of fruits and vegetables.  Education: Nutrition 1 -Group instruction provided by verbal, written material, interactive activities, discussions, models, and posters to present general guidelines for heart healthy nutrition including macronutrients, label reading, and promoting whole foods over processed counterparts. Education serves as pensions consultant of discussion of heart healthy eating for all. Written material provided at class time.    Education: Nutrition 2 -Group instruction provided by verbal, written material, interactive activities, discussions, models, and posters to present general guidelines for heart healthy nutrition including sodium, cholesterol, and saturated fat. Providing guidance of habit forming to  improve blood pressure, cholesterol, and body weight. Written material provided at class time.     Biometrics:  Pre Biometrics - 05/11/24 1139       Pre Biometrics   Height 5' 8.5 (1.74 m)    Weight 160 lb (72.6 kg)    Waist Circumference 36 inches    Hip Circumference 40 inches    Waist to Hip Ratio 0.9 %    BMI (Calculated) 23.97    Single Leg Stand 6.4 seconds           Nutrition Therapy Plan and Nutrition Goals:  Nutrition Therapy & Goals - 05/11/24 1140       Intervention Plan   Intervention Prescribe, educate and counsel regarding individualized specific dietary modifications aiming towards targeted core components such as weight, hypertension, lipid management, diabetes, heart failure and other comorbidities.    Expected Outcomes Short Term Goal: Understand basic principles of dietary content, such as calories, fat, sodium,  cholesterol and nutrients.;Short Term Goal: A plan has been developed with personal nutrition goals set during dietitian appointment.;Long Term Goal: Adherence to prescribed nutrition plan.          Nutrition Assessments:  MEDIFICTS Score Key: >=70 Need to make dietary changes  40-70 Heart Healthy Diet <= 40 Therapeutic Level Cholesterol Diet  Flowsheet Row Cardiac Rehab from 05/11/2024 in [facility] Cardiac and Pulmonary Rehab  Picture Your Plate Total Score on Admission 77   Picture Your Plate Scores: <59 Unhealthy dietary pattern with much room for improvement. 41-50 Dietary pattern unlikely to meet recommendations for good health and room for improvement. 51-60 More healthful dietary pattern, with some room for improvement.  >60 Healthy dietary pattern, although there Repka be some specific behaviors that could be improved.    Nutrition Goals Re-Evaluation:   Nutrition Goals Discharge (Final Nutrition Goals Re-Evaluation):   Psychosocial: Target Goals: Acknowledge presence or absence of significant depression and/or stress, maximize  coping skills, provide positive support system. Participant is able to verbalize types and ability to use techniques and skills needed for reducing stress and depression.   Education: Stress, Anxiety, and Depression - Group verbal and visual presentation to define topics covered.  Reviews how body is impacted by stress, anxiety, and depression.  Also discusses healthy ways to reduce stress and to treat/manage anxiety and depression. Written material provided at class time.   Education: Sleep Hygiene -Provides group verbal and written instruction about how sleep can affect your health.  Define sleep hygiene, discuss sleep cycles and impact of sleep habits. Review good sleep hygiene tips.   Initial Review & Psychosocial Screening:  Initial Psych Review & Screening - 05/11/24 1050       Initial Review   Current issues with None Identified      Family Dynamics   Good Support System? Yes      Barriers   Psychosocial barriers to participate in program There are no identifiable barriers or psychosocial needs.      Screening Interventions   Interventions Encouraged to exercise;Provide feedback about the scores to participant    Expected Outcomes Short Term goal: Utilizing psychosocial counselor, staff and physician to assist with identification of specific Stressors or current issues interfering with healing process. Setting desired goal for each stressor or current issue identified.;Long Term Goal: Stressors or current issues are controlled or eliminated.;Short Term goal: Identification and review with participant of any Quality of Life or Depression concerns found by scoring the questionnaire.;Long Term goal: The participant improves quality of Life and PHQ9 Scores as seen by post scores and/or verbalization of changes          Quality of Life Scores:   Quality of Life - 05/11/24 1048       Quality of Life   Select Quality of Life      Quality of Life Scores   Health/Function Pre  25.63 %    Socioeconomic Pre 23.88 %    Psych/Spiritual Pre 28.07 %    Family Pre 27.6 %    GLOBAL Pre 26 %         Scores of 19 and below usually indicate a poorer quality of life in these areas.  A difference of  2-3 points is a clinically meaningful difference.  A difference of 2-3 points in the total score of the Quality of Life Index has been associated with significant improvement in overall quality of life, self-image, physical symptoms, and general health in studies assessing change in quality  of life.  PHQ-9: Review Flowsheet       05/11/2024  Depression screen PHQ 2/9  Decreased Interest 0  Down, Depressed, Hopeless 0  PHQ - 2 Score 0  Altered sleeping 1  Tired, decreased energy 0  Change in appetite 0  Feeling bad or failure about yourself  0  Trouble concentrating 0  Moving slowly or fidgety/restless 0  Suicidal thoughts 0  PHQ-9 Score 1  Difficult doing work/chores Not difficult at all   Interpretation of Total Score  Total Score Depression Severity:  1-4 = Minimal depression, 5-9 = Mild depression, 10-14 = Moderate depression, 15-19 = Moderately severe depression, 20-27 = Severe depression   Psychosocial Evaluation and Intervention:  Psychosocial Evaluation - 05/11/24 1050       Psychosocial Evaluation & Interventions   Interventions Encouraged to exercise with the program and follow exercise prescription    Comments Mr. Doi is coming to cardiac rehab after a MI. He states he feels thankful to have made it through. He didn't realize until later just how bad off he was doing originally, stating his doctors told him they didn't think he was going to make it out of the hospital. He feels blessed to have recovered so well and states mentally he feels strong. His wife, family, and church family have been a great support system for him. He did lose some muscle during his long hospital stay and is looking to gain that back. 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 He is motivated to attend the program to work on his stamina and strength.    Continue Psychosocial Services  Follow up required by staff          Psychosocial Re-Evaluation:   Psychosocial Discharge (Final Psychosocial Re-Evaluation):   Vocational Rehabilitation: Provide vocational rehab assistance to qualifying candidates.   Vocational Rehab Evaluation & Intervention:  Vocational Rehab - 05/11/24 1033       Initial Vocational Rehab Evaluation & Intervention   Assessment shows need for Vocational Rehabilitation No          Education: Education Goals: Education classes will be provided on a variety of topics geared toward better understanding of heart health and risk factor modification. Participant will state understanding/return demonstration of topics presented as noted by education test scores.  Learning Barriers/Preferences:  Learning Barriers/Preferences - 05/11/24 1033       Learning Barriers/Preferences   Learning Barriers None    Learning Preferences None          General Cardiac Education Topics:  AED/CPR: - Group verbal and written instruction with the use of models to demonstrate the basic use of the AED with the basic ABC's of resuscitation.   Test and Procedures: - Group verbal and visual presentation and models provide information about basic cardiac anatomy and function. Reviews the testing methods done to diagnose heart disease and the outcomes of the test results. Describes the treatment choices: Medical Management, Angioplasty, or Coronary Bypass Surgery for treating various heart conditions including Myocardial Infarction, Angina, Valve Disease, and Cardiac Arrhythmias. Written material provided at class time. Flowsheet Row Cardiac Rehab from 05/11/2024 in [facility] Cardiac and Pulmonary Rehab  Education need identified 05/11/24    Medication Safety: - Group verbal and visual instruction to review commonly prescribed medications for  heart and lung disease. Reviews the medication, class of the drug, and side effects. Includes the steps to properly store meds and maintain the prescription regimen. Written material provided at class  time.   Intimacy: - Group verbal instruction through game format to discuss how heart and lung disease can affect sexual intimacy. Written material provided at class time.   Know Your Numbers and Heart Failure: - Group verbal and visual instruction to discuss disease risk factors for cardiac and pulmonary disease and treatment options.  Reviews associated critical values for Overweight/Obesity, Hypertension, Cholesterol, and Diabetes.  Discusses basics of heart failure: signs/symptoms and treatments.  Introduces Heart Failure Zone chart for action plan for heart failure. Written material provided at class time. Flowsheet Row Cardiac Rehab from 05/11/2024 in [facility] Cardiac and Pulmonary Rehab  Education need identified 05/11/24    Infection Prevention: - Provides verbal and written material to individual with discussion of infection control including proper hand washing and proper equipment cleaning during exercise session. Flowsheet Row Cardiac Rehab from 05/11/2024 in [facility] Cardiac and Pulmonary Rehab  Date 05/11/24  Educator [facility]  Instruction Review Code 1- Verbalizes Understanding    Falls Prevention: - Provides verbal and written material to individual with discussion of falls prevention and safety. Flowsheet Row Cardiac Rehab from 05/11/2024 in [facility] Cardiac and Pulmonary Rehab  Date 05/11/24  Educator [facility]  Instruction Review Code 1- Verbalizes Understanding    Other: -Provides group and verbal instruction on various topics (see comments)   Knowledge Questionnaire Score:  Knowledge Questionnaire Score - 05/11/24 1048       Knowledge Questionnaire Score   Pre Score 21/26          Core Components/Risk Factors/Patient Goals at Admission:  Personal Goals and Risk Factors at Admission  - 05/11/24 1139       Core Components/Risk Factors/Patient Goals on Admission    Weight Management Yes;Weight Gain    Intervention Weight Management: Develop a combined nutrition and exercise program designed to reach desired caloric intake, while maintaining appropriate intake of nutrient and fiber, sodium and fats, and appropriate energy expenditure required for the weight goal.;Weight Management: Provide education and appropriate resources to help participant work on and attain dietary goals.    Admit Weight 160 lb (72.6 kg)    Goal Weight: Short Term 165 lb (74.8 kg)    Goal Weight: Long Term 170 lb (77.1 kg)    Expected Outcomes Short Term: Continue to assess and modify interventions until short term weight is achieved;Long Term: Adherence to nutrition and physical activity/exercise program aimed toward attainment of established weight goal;Weight Loss: Understanding of general recommendations for a balanced deficit meal plan, which promotes 1-2 lb weight loss per week and includes a negative energy balance of [phone] kcal/d;Understanding recommendations for meals to include 15-35% energy as protein, 25-35% energy from fat, 35-60% energy from carbohydrates, less than 200mg  of dietary cholesterol, 20-35 gm of total fiber daily;Understanding of distribution of calorie intake throughout the day with the consumption of 4-5 meals/snacks    Hypertension Yes    Intervention Provide education on lifestyle modifcations including regular physical activity/exercise, weight management, moderate sodium restriction and increased consumption of fresh fruit, vegetables, and low fat dairy, alcohol moderation, and smoking cessation.;Monitor prescription use compliance.    Expected Outcomes Short Term: Continued assessment and intervention until BP is < 140/13mm HG in hypertensive participants. < 130/66mm HG in hypertensive participants with diabetes, heart failure or chronic kidney disease.;Long Term: Maintenance of  blood pressure at goal levels.    Lipids Yes    Intervention Provide education and support for participant on nutrition & aerobic/resistive exercise along with prescribed medications to achieve LDL 70mg , HDL >40mg .  Expected Outcomes Short Term: Participant states understanding of desired cholesterol values and is compliant with medications prescribed. Participant is following exercise prescription and nutrition guidelines.;Long Term: Cholesterol controlled with medications as prescribed, with individualized exercise RX and with personalized nutrition plan. Value goals: LDL < 70mg , HDL > 40 mg.          Education:Diabetes - Individual verbal and written instruction to review signs/symptoms of diabetes, desired ranges of glucose level fasting, after meals and with exercise. Acknowledge that pre and post exercise glucose checks will be done for 3 sessions at entry of program.   Core Components/Risk Factors/Patient Goals Review:    Core Components/Risk Factors/Patient Goals at Discharge (Final Review):    ITP Comments:  ITP Comments     Row Name 05/11/24 0957           ITP Comments Completed program orientation and . Initial ITP created and sent for review to Medical Director.          Comments: initial ITP    [1]  Current Outpatient Medications:    aspirin  EC 81 MG tablet, Take 1 tablet (81 mg total) by mouth daily. Swallow whole., Disp: 30 tablet, Rfl: 12   dapagliflozin  propanediol (FARXIGA ) 10 MG TABS tablet, Take 1 tablet (10 mg total) by mouth daily before breakfast., Disp: 30 tablet, Rfl: 5   finasteride  (PROSCAR ) 5 MG tablet, Take 5 mg by mouth daily., Disp: , Rfl:    levothyroxine (SYNTHROID) 50 MCG tablet, Take 50 mcg by mouth., Disp: , Rfl:    Multiple Vitamins-Minerals (MULTIVITAMIN WITH MINERALS) tablet, Take 1 tablet by mouth daily., Disp: , Rfl:    pantoprazole  (PROTONIX ) 40 MG tablet, Take 40 mg by mouth daily., Disp: , Rfl:    rosuvastatin  (CRESTOR ) 40  MG tablet, Take 1 tablet (40 mg total) by mouth daily., Disp: 90 tablet, Rfl: 1   spironolactone  (ALDACTONE ) 25 MG tablet, Take 0.5 tablets (12.5 mg total) by mouth daily., Disp: 45 tablet, Rfl: 1   tamsulosin  (FLOMAX ) 0.4 MG CAPS capsule, Take 0.4 mg by mouth daily. , Disp: , Rfl:    ticagrelor  (BRILINTA ) 90 MG TABS tablet, Take 1 tablet (90 mg total) by mouth 2 (two) times daily., Disp: 60 tablet, Rfl: 6   acetaminophen  (TYLENOL ) 500 MG tablet, Take 500 mg by mouth every 6 (six) hours as needed for mild pain or moderate pain., Disp: , Rfl:    senna-docusate (SENOKOT-S) 8.6-50 MG tablet, Take 1 tablet by mouth daily. Stop taking if develop diarrhea, Disp: 30 tablet, Rfl: 6 [2]  Social History Tobacco Use  Smoking Status Never  Smokeless Tobacco Never

## 2024-05-11 NOTE — Patient Instructions (Signed)
 Patient Instructions  Patient Details  Name: Timothy Nicholson MRN: [id number] Date of Birth: 1951/01/31 Referring Provider:  Rolan Ezra RAMAN, MD  Below are your personal goals for exercise, nutrition, and risk factors. Our goal is to help you stay on track towards obtaining and maintaining these goals. We will be discussing your progress on these goals with you throughout the program.  Initial Exercise Prescription:  Initial Exercise Prescription - 05/11/24 1100       Date of Initial Exercise RX and Referring Provider   Date 05/11/24    Referring Provider Rolan      Oxygen   Maintain Oxygen Saturation 88% or higher      Treadmill   MPH 2.2    Grade 1    Minutes 15    METs 2.99      Recumbant Bike   Level 2    RPM 50    Minutes 15    METs 2.8      NuStep   Level 2    SPM 80    Minutes 15    METs 2.8      REL-XR   Level 2    Speed 50    Minutes 15    METs 2.8      Biostep-RELP   Level 2    SPM 50    Minutes 15    METs 2.8      Prescription Details   Frequency (times per week) 3    Duration Progress to 30 minutes of continuous aerobic without signs/symptoms of physical distress      Intensity   THRR 40-80% of Max Heartrate 99-131    Ratings of Perceived Exertion 11-13    Perceived Dyspnea 0-4      Progression   Progression Continue to progress workloads to maintain intensity without signs/symptoms of physical distress.      Resistance Training   Training Prescription Yes    Weight 4    Reps 10-15          Exercise Goals: Frequency: Be able to perform aerobic exercise two to three times per week in program working toward 2-5 days per week of home exercise.  Intensity: Work with a perceived exertion of 11 (fairly light) - 15 (hard) while following your exercise prescription.  We will make changes to your prescription with you as you progress through the program.   Duration: Be able to do 30 to 45 minutes of continuous aerobic exercise in addition to  a 5 minute warm-up and a 5 minute cool-down routine.   Nutrition Goals: Your personal nutrition goals will be established when you do your nutrition analysis with the dietician.  The following are general nutrition guidelines to follow: Cholesterol < 200mg /day Sodium < 1500mg /day Fiber: Men over 50 yrs - 30 grams per day  Personal Goals:  Personal Goals and Risk Factors at Admission - 05/11/24 1139       Core Components/Risk Factors/Patient Goals on Admission    Weight Management Yes;Weight Gain    Intervention Weight Management: Develop a combined nutrition and exercise program designed to reach desired caloric intake, while maintaining appropriate intake of nutrient and fiber, sodium and fats, and appropriate energy expenditure required for the weight goal.;Weight Management: Provide education and appropriate resources to help participant work on and attain dietary goals.    Admit Weight 160 lb (72.6 kg)    Goal Weight: Short Term 165 lb (74.8 kg)    Goal Weight: Long Term 170 lb (  77.1 kg)    Expected Outcomes Short Term: Continue to assess and modify interventions until short term weight is achieved;Long Term: Adherence to nutrition and physical activity/exercise program aimed toward attainment of established weight goal;Weight Loss: Understanding of general recommendations for a balanced deficit meal plan, which promotes 1-2 lb weight loss per week and includes a negative energy balance of [phone] kcal/d;Understanding recommendations for meals to include 15-35% energy as protein, 25-35% energy from fat, 35-60% energy from carbohydrates, less than 200mg  of dietary cholesterol, 20-35 gm of total fiber daily;Understanding of distribution of calorie intake throughout the day with the consumption of 4-5 meals/snacks    Hypertension Yes    Intervention Provide education on lifestyle modifcations including regular physical activity/exercise, weight management, moderate sodium restriction and  increased consumption of fresh fruit, vegetables, and low fat dairy, alcohol moderation, and smoking cessation.;Monitor prescription use compliance.    Expected Outcomes Short Term: Continued assessment and intervention until BP is < 140/37mm HG in hypertensive participants. < 130/66mm HG in hypertensive participants with diabetes, heart failure or chronic kidney disease.;Long Term: Maintenance of blood pressure at goal levels.    Lipids Yes    Intervention Provide education and support for participant on nutrition & aerobic/resistive exercise along with prescribed medications to achieve LDL 70mg , HDL >40mg .    Expected Outcomes Short Term: Participant states understanding of desired cholesterol values and is compliant with medications prescribed. Participant is following exercise prescription and nutrition guidelines.;Long Term: Cholesterol controlled with medications as prescribed, with individualized exercise RX and with personalized nutrition plan. Value goals: LDL < 70mg , HDL > 40 mg.          Tobacco Use Initial Evaluation: Social History   Tobacco Use  Smoking Status Never  Smokeless Tobacco Never    Exercise Goals and Review:  Exercise Goals     Row Name 05/11/24 1138             Exercise Goals   Increase Physical Activity Yes       Intervention Provide advice, education, support and counseling about physical activity/exercise needs.;Develop an individualized exercise prescription for aerobic and resistive training based on initial evaluation findings, risk stratification, comorbidities and participant's personal goals.       Expected Outcomes Short Term: Attend rehab on a regular basis to increase amount of physical activity.;Long Term: Add in home exercise to make exercise part of routine and to increase amount of physical activity.;Long Term: Exercising regularly at least 3-5 days a week.       Increase Strength and Stamina Yes       Intervention Provide advice, education,  support and counseling about physical activity/exercise needs.;Develop an individualized exercise prescription for aerobic and resistive training based on initial evaluation findings, risk stratification, comorbidities and participant's personal goals.       Expected Outcomes Short Term: Increase workloads from initial exercise prescription for resistance, speed, and METs.;Short Term: Perform resistance training exercises routinely during rehab and add in resistance training at home;Long Term: Improve cardiorespiratory fitness, muscular endurance and strength as measured by increased METs and functional capacity ( )       Able to understand and use rate of perceived exertion (RPE) scale Yes       Intervention Provide education and explanation on how to use RPE scale       Expected Outcomes Short Term: Able to use RPE daily in rehab to express subjective intensity level;Long Term:  Able to use RPE to guide intensity level when exercising independently  Able to understand and use Dyspnea scale Yes       Intervention Provide education and explanation on how to use Dyspnea scale       Expected Outcomes Short Term: Able to use Dyspnea scale daily in rehab to express subjective sense of shortness of breath during exertion;Long Term: Able to use Dyspnea scale to guide intensity level when exercising independently       Knowledge and understanding of Target Heart Rate Range (THRR) Yes       Intervention Provide education and explanation of THRR including how the numbers were predicted and where they are located for reference       Expected Outcomes Short Term: Able to state/look up THRR;Long Term: Able to use THRR to govern intensity when exercising independently;Short Term: Able to use daily as guideline for intensity in rehab       Able to check pulse independently Yes       Intervention Provide education and demonstration on how to check pulse in carotid and radial arteries.;Review the importance of  being able to check your own pulse for safety during independent exercise       Expected Outcomes Short Term: Able to explain why pulse checking is important during independent exercise;Long Term: Able to check pulse independently and accurately       Understanding of Exercise Prescription Yes       Intervention Provide education, explanation, and written materials on patient's individual exercise prescription       Expected Outcomes Short Term: Able to explain program exercise prescription;Long Term: Able to explain home exercise prescription to exercise independently          Copy of goals given to participant.

## 2024-05-13 ENCOUNTER — Encounter

## 2024-05-13 DIAGNOSIS — I2102 ST elevation (STEMI) myocardial infarction involving left anterior descending coronary artery: Secondary | ICD-10-CM | POA: Diagnosis not present

## 2024-05-13 DIAGNOSIS — I213 ST elevation (STEMI) myocardial infarction of unspecified site: Secondary | ICD-10-CM

## 2024-05-13 NOTE — Progress Notes (Signed)
 Daily Session Note  Patient Details  Name: Timothy Nicholson MRN: [id number] Date of Birth: 10-Apr-1951 Referring Provider:   Flowsheet Row Cardiac Rehab from 05/11/2024 in [facility] Cardiac and Pulmonary Rehab  Referring Provider Rolan    Encounter Date: 05/13/2024  Check In:  Session Check In - 05/13/24 0934       Check-In   Supervising physician immediately available to respond to emergencies See telemetry face sheet for immediately available ER MD    Location [facility]-Cardiac & Pulmonary Rehab    Staff Present Burnard Davenport RN,BSN,MPA;Joseph [facility] RCP,RRT,BSRT;Laura Cates RN,BSN;Noah Tickle, [place], Exercise Physiologist    Virtual Visit No    Medication changes reported     No    Fall or balance concerns reported    No    Warm-up and Cool-down Performed on first and last piece of equipment    Resistance Training Performed Yes    VAD Patient? No    PAD/SET Patient? No      Pain Assessment   Currently in Pain? No/denies             Tobacco Use History[1]  Goals Met:  Independence with exercise equipment Exercise tolerated well No report of concerns or symptoms today Strength training completed today  Goals Unmet:  Not Applicable  Comments: First full day of exercise!  Patient was oriented to gym and equipment including functions, settings, policies, and procedures.  Patient's individual exercise prescription and treatment plan were reviewed.  All starting workloads were established based on the results of the 6 minute walk test done at initial orientation visit.  The plan for exercise progression was also introduced and progression will be customized based on patient's performance and goals.    Dr. Oneil Pinal is Medical Director for [facility] Cardiac Rehabilitation.  Dr. Fuad Aleskerov is Medical Director for [facility] Pulmonary Rehabilitation.    [1]  Social History Tobacco Use  Smoking Status Never  Smokeless Tobacco Never

## 2024-05-16 ENCOUNTER — Encounter

## 2024-05-17 ENCOUNTER — Ambulatory Visit

## 2024-05-17 DIAGNOSIS — I5020 Unspecified systolic (congestive) heart failure: Secondary | ICD-10-CM | POA: Diagnosis not present

## 2024-05-17 LAB — ECHOCARDIOGRAM COMPLETE
AR max vel: 2.7 cm2
AV Area VTI: 2.61 cm2
AV Area mean vel: 2.68 cm2
AV Mean grad: 4 mmHg
AV Peak grad: 6.6 mmHg
Ao pk vel: 1.28 m/s
Area-P 1/2: 2.8 cm2
Calc EF: 48.4 %
P 1/2 time: 405 ms
S' Lateral: 4.2 cm
Single Plane A2C EF: 46.3 %
Single Plane A4C EF: 49.7 %

## 2024-05-18 ENCOUNTER — Encounter: Admitting: Emergency Medicine

## 2024-05-18 DIAGNOSIS — I213 ST elevation (STEMI) myocardial infarction of unspecified site: Secondary | ICD-10-CM

## 2024-05-18 NOTE — Progress Notes (Signed)
 Daily Session Note  Patient Details  Name: Timothy Nicholson MRN: [id number] Date of Birth: 1950-11-04 Referring Provider:   Flowsheet Row Cardiac Rehab from 05/11/2024 in [facility] Cardiac and Pulmonary Rehab  Referring Provider Rolan    Encounter Date: 05/18/2024  Check In:  Session Check In - 05/18/24 0944       Check-In   Supervising physician immediately available to respond to emergencies See telemetry face sheet for immediately available ER MD    Location [facility]-Cardiac & Pulmonary Rehab    Staff Present Leita Franks RN,BSN;Joseph [facility] BS, Exercise Physiologist;Margaret Best, MS, Exercise Physiologist    Virtual Visit No    Medication changes reported     No    Fall or balance concerns reported    No    Warm-up and Cool-down Performed on first and last piece of equipment    Resistance Training Performed Yes    VAD Patient? No    PAD/SET Patient? No      Pain Assessment   Currently in Pain? No/denies             Tobacco Use History[1]  Goals Met:  Independence with exercise equipment Exercise tolerated well No report of concerns or symptoms today Strength training completed today  Goals Unmet:  Not Applicable  Comments: Pt able to follow exercise prescription today without complaint.  Will continue to monitor for progression.    Dr. Oneil Pinal is Medical Director for [facility] Cardiac Rehabilitation.  Dr. Fuad Aleskerov is Medical Director for [facility] Pulmonary Rehabilitation.    [1]  Social History Tobacco Use  Smoking Status Never  Smokeless Tobacco Never

## 2024-05-20 ENCOUNTER — Encounter

## 2024-05-20 DIAGNOSIS — I213 ST elevation (STEMI) myocardial infarction of unspecified site: Secondary | ICD-10-CM

## 2024-05-20 NOTE — Progress Notes (Signed)
 Daily Session Note  Patient Details  Name: Timothy Nicholson MRN: [id number] Date of Birth: 12/04/1950 Referring Provider:   Flowsheet Row Cardiac Rehab from 05/11/2024 in [facility] Cardiac and Pulmonary Rehab  Referring Provider Rolan    Encounter Date: 05/20/2024  Check In:  Session Check In - 05/20/24 0913       Check-In   Supervising physician immediately available to respond to emergencies See telemetry face sheet for immediately available ER MD    Location [facility]-Cardiac & Pulmonary Rehab    Staff Present Burnard Davenport RN,BSN,MPA;Joseph [facility] RCP,RRT,BSRT;Maxon Conetta BS, Exercise Physiologist;Noah Tickle, BS, Exercise Physiologist    Virtual Visit No    Medication changes reported     No    Fall or balance concerns reported    No    Warm-up and Cool-down Performed on first and last piece of equipment    Resistance Training Performed Yes    VAD Patient? No    PAD/SET Patient? No      Pain Assessment   Currently in Pain? No/denies             Tobacco Use History[1]  Goals Met:  Independence with exercise equipment Exercise tolerated well No report of concerns or symptoms today Strength training completed today  Goals Unmet:  Not Applicable  Comments: Pt able to follow exercise prescription today without complaint.  Will continue to monitor for progression.    Dr. Oneil Pinal is Medical Director for [facility] Cardiac Rehabilitation.  Dr. Fuad Aleskerov is Medical Director for [facility] Pulmonary Rehabilitation.    [1]  Social History Tobacco Use  Smoking Status Never  Smokeless Tobacco Never

## 2024-05-23 ENCOUNTER — Encounter

## 2024-05-25 ENCOUNTER — Encounter

## 2024-05-27 ENCOUNTER — Encounter

## 2024-05-30 ENCOUNTER — Encounter

## 2024-06-01 ENCOUNTER — Encounter

## 2024-06-03 ENCOUNTER — Encounter

## 2024-06-06 ENCOUNTER — Encounter

## 2024-06-08 ENCOUNTER — Encounter

## 2024-06-10 ENCOUNTER — Encounter

## 2024-06-13 ENCOUNTER — Encounter

## 2024-06-15 ENCOUNTER — Encounter

## 2024-06-17 ENCOUNTER — Encounter

## 2024-06-20 ENCOUNTER — Encounter

## 2024-06-22 ENCOUNTER — Encounter

## 2024-06-24 ENCOUNTER — Encounter

## 2024-06-27 ENCOUNTER — Encounter

## 2024-06-29 ENCOUNTER — Encounter

## 2024-07-01 ENCOUNTER — Encounter

## 2024-07-04 ENCOUNTER — Encounter

## 2024-07-06 ENCOUNTER — Encounter

## 2024-07-08 ENCOUNTER — Encounter

## 2024-07-11 ENCOUNTER — Encounter

## 2024-07-13 ENCOUNTER — Encounter

## 2024-07-15 ENCOUNTER — Encounter

## 2024-07-18 ENCOUNTER — Encounter

## 2024-07-20 ENCOUNTER — Encounter

## 2024-07-22 ENCOUNTER — Encounter

## 2024-07-25 ENCOUNTER — Encounter

## 2024-07-27 ENCOUNTER — Encounter

## 2024-07-29 ENCOUNTER — Encounter

## 2024-08-01 ENCOUNTER — Ambulatory Visit: Admitting: Cardiology

## 2024-08-01 ENCOUNTER — Encounter

## 2024-08-03 ENCOUNTER — Encounter

## 2024-08-05 ENCOUNTER — Encounter

## 2024-08-08 ENCOUNTER — Encounter
